# Patient Record
Sex: Female | Born: 1990 | Race: White | Hispanic: No | Marital: Married | Smoking: Never smoker
Health system: Southern US, Community
[De-identification: ages and names within clinical notes are randomized; demographics above are authoritative.]

## PROBLEM LIST (undated history)

## (undated) ENCOUNTER — Emergency Department (HOSPITAL_COMMUNITY): Admission: EM | Payer: BC Managed Care – PPO | Source: Home / Self Care

## (undated) DIAGNOSIS — T8859XA Other complications of anesthesia, initial encounter: Secondary | ICD-10-CM

## (undated) DIAGNOSIS — F410 Panic disorder [episodic paroxysmal anxiety] without agoraphobia: Secondary | ICD-10-CM

## (undated) DIAGNOSIS — M5126 Other intervertebral disc displacement, lumbar region: Secondary | ICD-10-CM

## (undated) DIAGNOSIS — Z8489 Family history of other specified conditions: Secondary | ICD-10-CM

## (undated) DIAGNOSIS — F419 Anxiety disorder, unspecified: Secondary | ICD-10-CM

## (undated) DIAGNOSIS — E559 Vitamin D deficiency, unspecified: Secondary | ICD-10-CM

## (undated) DIAGNOSIS — G932 Benign intracranial hypertension: Secondary | ICD-10-CM

## (undated) DIAGNOSIS — R519 Headache, unspecified: Secondary | ICD-10-CM

## (undated) DIAGNOSIS — K219 Gastro-esophageal reflux disease without esophagitis: Secondary | ICD-10-CM

## (undated) DIAGNOSIS — K824 Cholesterolosis of gallbladder: Secondary | ICD-10-CM

## (undated) HISTORY — DX: Benign intracranial hypertension: G93.2

## (undated) HISTORY — DX: Cholesterolosis of gallbladder: K82.4

## (undated) HISTORY — PX: WISDOM TOOTH EXTRACTION: SHX21

## (undated) HISTORY — PX: NO PAST SURGERIES: SHX2092

---
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
## 2012-12-28 ENCOUNTER — Emergency Department (HOSPITAL_COMMUNITY)
Admission: EM | Admit: 2012-12-28 | Discharge: 2012-12-28 | Disposition: A | Discharge status: Home / Self Care | Payer: Self-pay | Attending: Emergency Medicine | Admitting: Emergency Medicine

## 2012-12-28 ENCOUNTER — Encounter (HOSPITAL_COMMUNITY): Payer: Self-pay | Admitting: Emergency Medicine

## 2012-12-28 ENCOUNTER — Emergency Department (HOSPITAL_COMMUNITY): Payer: Self-pay

## 2012-12-28 DIAGNOSIS — Z8659 Personal history of other mental and behavioral disorders: Secondary | ICD-10-CM | POA: Insufficient documentation

## 2012-12-28 DIAGNOSIS — R55 Syncope and collapse: Secondary | ICD-10-CM | POA: Insufficient documentation

## 2012-12-28 DIAGNOSIS — Z3202 Encounter for pregnancy test, result negative: Secondary | ICD-10-CM | POA: Insufficient documentation

## 2012-12-28 HISTORY — DX: Anxiety disorder, unspecified: F41.9

## 2012-12-28 HISTORY — DX: Panic disorder (episodic paroxysmal anxiety): F41.0

## 2012-12-28 LAB — URINALYSIS, ROUTINE W REFLEX MICROSCOPIC
Bilirubin Urine: NEGATIVE
Ketones, ur: NEGATIVE mg/dL
Protein, ur: NEGATIVE mg/dL
Specific Gravity, Urine: 1.015 (ref 1.005–1.030)
Urobilinogen, UA: 0.2 mg/dL (ref 0.0–1.0)
pH: 7 (ref 5.0–8.0)

## 2012-12-28 LAB — URINE MICROSCOPIC-ADD ON

## 2012-12-28 NOTE — ED Notes (Addendum)
Patient brought in via EMS. Alert and oriented. Airway patent. Patient having blood drawn at [facility] when she started feeling dizzy. Patient reports LOC shortly after blood drawn in which she fell onto concrete floor from sited position on stretcher. Per patient has not eaten since yesterday and has panic attacks. Patient reports some soreness in neck. No C-collar placed on patient by EMS.

## 2012-12-28 NOTE — ED Provider Notes (Signed)
CSN: [id number]     Arrival date & time 12/28/12  1352 History   First MD Initiated Contact with Patient 12/28/12 1511     Chief Complaint  Patient presents with  . Loss of Consciousness    HPI Pt was seen at 1525. Per pt and her family, c/o sudden onset and resolution of one episode of brief syncope that occurred PTA. [facility] and blood draw today. States when they started to draw her blood she "felt dizzy" and "passed out." Endorses hx of same. States she tried to lay down from a sitting position but "missed the bed" and "fell to the floor." States she last ate yesterday. Denies seizure activity, no incont of bowel/bladder, no AMS/confusion, no CP/palpitations, no SOB/cough, no abd pain, no N/V/D, no visual changes, no focal motor weakness, no tingling/numbness in extremities, no ataxia, no slurred speech, no facial droop.    Past Medical History  Diagnosis Date  . Anxiety   . Panic attacks    History reviewed. No pertinent past surgical history.  History  Substance Use Topics  . Smoking status: Never Smoker   . Smokeless tobacco: Never Used  . Alcohol Use: Yes     Comment: occasional    Review of Systems ROS: Statement: All systems negative except as marked or noted in the HPI; Constitutional: Negative for fever and chills. ; ; Eyes: Negative for eye pain, redness and discharge. ; ; ENMT: Negative for ear pain, hoarseness, nasal congestion, sinus pressure and sore throat. ; ; Cardiovascular: Negative for chest pain, palpitations, diaphoresis, dyspnea and peripheral edema. ; ; Respiratory: Negative for cough, wheezing and stridor. ; ; Gastrointestinal: Negative for nausea, vomiting, diarrhea, abdominal pain, blood in stool, hematemesis, jaundice and rectal bleeding. . ; ; Genitourinary: Negative for dysuria, flank pain and hematuria. ; ; Musculoskeletal: Negative for back pain. Negative for swelling and deformity.; ; Skin: Negative for  pruritus, rash, abrasions, blisters, bruising and skin lesion.; ; Neuro: Negative for headache, lightheadedness and neck stiffness. Negative for weakness, altered mental status, extremity weakness, paresthesias, involuntary movement, seizure and +syncope.     Allergies  Review of patient's allergies indicates no known allergies.  Home Medications  No current outpatient prescriptions on file. BP 120/76  Pulse 66  Temp(Src) 98.3 F (36.8 C) (Oral)  Resp 20  Ht 5\' 5"  (1.651 m)  Wt 160 lb (72.576 kg)  BMI 26.63 kg/m2  SpO2 100%  LMP 12/25/2012 Physical Exam 1530: Physical examination:  Nursing notes reviewed; Vital signs and O2 SAT reviewed;  Constitutional: Well developed, Well nourished, Well hydrated, In no acute distress; Head:  Normocephalic, atraumatic; Eyes: EOMI, PERRL, No scleral icterus; ENMT: Mouth and pharynx normal, Mucous membranes moist; Neck: Supple, Full range of motion, No lymphadenopathy; Cardiovascular: Regular rate and rhythm, No murmur, rub, or gallop; Respiratory: Breath sounds clear & equal bilaterally, No rales, rhonchi, wheezes.  Speaking full sentences with ease, Normal respiratory effort/excursion; Chest: Nontender, Movement normal; Abdomen: Soft, Nontender, Nondistended, Normal bowel sounds; Genitourinary: No CVA tenderness; Spine:  No midline CS, TS, LS tenderness.;; Extremities: Pulses normal, No tenderness, No edema, No calf edema or asymmetry.; Neuro: AA&Ox3, Major CN grossly intact.  Speech clear. Climbs on and off stretcher easily by herself. Gait steady. No gross focal motor or sensory deficits in extremities.; Skin: Color normal, Warm, Dry.   ED Course  Procedures   1535:  Pt states she "doesn't want any blood drawn." Pt's mother  at bedside endorses pt has hx of "passing out when she gets her blood drawn." Pt is agreeable to check her CBG.   1800:  Pt not orthostatic. Has tol PO well while in the ED without N/V. States she feels "much better now" and wants  to go home. Dx and testing d/w pt and family.  Questions answered.  Verb understanding, agreeable to d/c home with outpt f/u.    EKG Interpretation   None       MDM  MDM Reviewed: previous chart, nursing note and vitals Interpretation: labs and CT scan     Results for orders placed during the hospital encounter of 12/28/12  URINALYSIS, ROUTINE W REFLEX MICROSCOPIC      Result Value Range   Color, Urine YELLOW  YELLOW   APPearance CLEAR  CLEAR   Specific Gravity, Urine 1.015  1.005 - 1.030   pH 7.0  5.0 - 8.0   Glucose, UA NEGATIVE  NEGATIVE mg/dL   Hgb urine dipstick TRACE (*) NEGATIVE   Bilirubin Urine NEGATIVE  NEGATIVE   Ketones, ur NEGATIVE  NEGATIVE mg/dL   Protein, ur NEGATIVE  NEGATIVE mg/dL   Urobilinogen, UA 0.2  0.0 - 1.0 mg/dL   Nitrite NEGATIVE  NEGATIVE   Leukocytes, UA NEGATIVE  NEGATIVE  PREGNANCY, URINE      Result Value Range   Preg Test, Ur NEGATIVE  NEGATIVE  GLUCOSE, CAPILLARY      Result Value Range   Glucose-Capillary 103 (*) 70 - 99 mg/dL  URINE MICROSCOPIC-ADD ON      Result Value Range   Squamous Epithelial / LPF RARE  RARE   WBC, UA 0-2  <3 WBC/hpf   RBC / HPF 0-2  <3 RBC/hpf   Bacteria, UA RARE  RARE  POCT PREGNANCY, URINE      Result Value Range   Preg Test, Ur NEGATIVE  NEGATIVE   Ct Head Wo Contrast 12/28/2012   CLINICAL DATA:  Syncopal episode well having blood drawn today. Fall. Neck soreness. Posterior headache.  EXAM: CT HEAD WITHOUT CONTRAST  CT CERVICAL SPINE WITHOUT CONTRAST  TECHNIQUE: Multidetector CT imaging of the head and cervical spine was performed following the standard protocol without intravenous contrast. Multiplanar CT image reconstructions of the cervical spine were also generated.  COMPARISON:  None.  FINDINGS: CT HEAD FINDINGS  No acute cortical infarct, hemorrhage, or mass lesion is present. The ventricles are of normal size. No significant extra-axial fluid collection is evident. The paranasal sinuses and mastoid  air cells are clear. The osseous skull is intact.  CT CERVICAL SPINE FINDINGS  The cervical spine is imaged from the skull base through T1-2. The vertebral body heights are maintained. Alignment is anatomic. There straightening of the normal cervical lordosis. The soft tissues are unremarkable. The lung apices are clear.  IMPRESSION: 1. Normal CT of the head. 2. No acute fracture or traumatic subluxation in the cervical spine. 3. Straightening of the normal cervical lordosis is likely positional. This can be seen in the setting of ongoing pain or muscle strain.   Electronically Signed   By: Gennette Pac M.D.   On: 12/28/2012 16:51   Ct Cervical Spine Wo Contrast 12/28/2012   CLINICAL DATA:  Syncopal episode well having blood drawn today. Fall. Neck soreness. Posterior headache.  EXAM: CT HEAD WITHOUT CONTRAST  CT CERVICAL SPINE WITHOUT CONTRAST  TECHNIQUE: Multidetector CT imaging of the head and cervical spine was performed following the standard protocol without intravenous contrast. Multiplanar CT  image reconstructions of the cervical spine were also generated.  COMPARISON:  None.  FINDINGS: CT HEAD FINDINGS  No acute cortical infarct, hemorrhage, or mass lesion is present. The ventricles are of normal size. No significant extra-axial fluid collection is evident. The paranasal sinuses and mastoid air cells are clear. The osseous skull is intact.  CT CERVICAL SPINE FINDINGS  The cervical spine is imaged from the skull base through T1-2. The vertebral body heights are maintained. Alignment is anatomic. There straightening of the normal cervical lordosis. The soft tissues are unremarkable. The lung apices are clear.  IMPRESSION: 1. Normal CT of the head. 2. No acute fracture or traumatic subluxation in the cervical spine. 3. Straightening of the normal cervical lordosis is likely positional. This can be seen in the setting of ongoing pain or muscle strain.   Electronically Signed   By: Gennette Pac M.D.   On:  12/28/2012 16:51      Laray Anger, DO 12/30/12 1658

## 2012-12-29 LAB — HM PAP SMEAR

## 2013-03-11 ENCOUNTER — Inpatient Hospital Stay (HOSPITAL_COMMUNITY)
Admission: AD | Admit: 2013-03-11 | Discharge: 2013-03-11 | Discharge status: Against Medical Advice | Payer: Self-pay | Attending: Obstetrics & Gynecology | Admitting: Obstetrics & Gynecology

## 2013-03-22 ENCOUNTER — Encounter: Payer: Self-pay | Admitting: *Deleted

## 2013-03-24 ENCOUNTER — Encounter: Payer: Self-pay | Admitting: Obstetrics and Gynecology

## 2013-03-24 ENCOUNTER — Encounter (INDEPENDENT_AMBULATORY_CARE_PROVIDER_SITE_OTHER): Payer: Self-pay

## 2013-03-24 ENCOUNTER — Ambulatory Visit (INDEPENDENT_AMBULATORY_CARE_PROVIDER_SITE_OTHER): Payer: Self-pay | Admitting: Obstetrics and Gynecology

## 2013-03-24 VITALS — BP 100/60 | Ht 66.0 in | Wt 163.0 lb

## 2013-03-24 DIAGNOSIS — Z719 Counseling, unspecified: Secondary | ICD-10-CM

## 2013-03-24 DIAGNOSIS — R87619 Unspecified abnormal cytological findings in specimens from cervix uteri: Secondary | ICD-10-CM

## 2013-03-24 DIAGNOSIS — F411 Generalized anxiety disorder: Secondary | ICD-10-CM

## 2013-03-24 NOTE — Patient Instructions (Signed)
Colposcopy  Colposcopy is a procedure to examine your cervix and vagina, or the area around the outside of your vagina, for abnormalities or signs of disease. The procedure is done using a lighted microscope called a colposcope. Tissue samples may be collected during the colposcopy if your health care provider finds any unusual cells. A colposcopy may be done if a woman has:   An abnormal Pap test. A Pap test is a medical test done to evaluate cells that are on the surface of the cervix.   A Pap test result that is suggestive of human papillomavirus (HPV). This virus can cause genital warts and is linked to the development of cervical cancer.   A sore on her cervix and the results of a Pap test were normal.   Genital warts on the cervix or in or around the outside of the vagina.   A mother who took the drug diethylstilbestrol (DES) while pregnant.   Painful intercourse.   Vaginal bleeding, especially after sexual intercourse.  LET YOUR HEALTH CARE PROVIDER KNOW ABOUT:   Any allergies you have.   All medicines you are taking, including vitamins, herbs, eye drops, creams, and over-the-counter medicines.   Previous problems you or members of your family have had with the use of anesthetics.   Any blood disorders you have.   Previous surgeries you have had.   Medical conditions you have.  RISKS AND COMPLICATIONS  Generally, a colposcopy is a safe procedure. However, as with any procedure, complications can occur. Possible complications include:   Bleeding.   Infection.   Missed lesions.  BEFORE THE PROCEDURE    Tell your health care provider if you have your menstrual period. A colposcopy typically is not done during menstruation.   For 24 hours before the colposcopy, do not:   Douche.   Use tampons.   Use medicines, creams, or suppositories in the vagina.   Have sexual intercourse.  PROCEDURE   During the procedure, you will be lying on your back with your feet in foot rests (stirrups). A warm  metal or plastic instrument (speculum) will be placed in your vagina to keep it open and to allow the health care provider to see the cervix. The colposcope will be placed outside the vagina. It will be used to magnify and examine the cervix, vagina, and the area around the outside of the vagina. A small amount of liquid solution will be placed on the area that is to be viewed. This solution will make it easier to see the abnormal cells. Your health care provider will use tools to suck out mucus and cells from the canal of the cervix. Then he or she will record the location of the abnormal areas.  If a biopsy is done during the procedure, a medicine will usually be given to numb the area (local anesthetic). You may feel mild pain or cramping while the biopsy is done. After the procedure, tissue samples collected during the biopsy will be sent to a lab for analysis.  AFTER THE PROCEDURE   You will be given instructions on when to follow up with your health care provider for your test results. It is important to keep your appointment.  Document Released: 03/30/2002 Document Revised: 09/09/2012 Document Reviewed: 08/06/2012  [employer] Patient Information 2014 [employer]

## 2013-03-24 NOTE — Progress Notes (Signed)
This chart was scribed by Jenne Campus, Medical Scribe, for Dr. Mallory Shirk on 03/24/13 at 11:47 AM. This chart was reviewed by Dr. Mallory Shirk and is accurate.    [facility] Visit  Patient name: Lisa Christian MRN [id number]  Date of birth: 08/07/1990  CC & HPI:  Lisa Christian is a 23 y.o. female presenting today for colposcopy discussion. H/o panic attacks and syncope. Here to met me and discuss surgery to help with anxiety. Low grade abnormalities on PAP smear. Had BV and yeast at time of PAP.  ROS:  No complaints  Pertinent History Reviewed:  Medical & Surgical Hx:  Reviewed: Significant for panic attacks and syncope Medications: Reviewed & Updated - see associated section Social History: Reviewed -  reports that she has quit smoking. She has never used smokeless tobacco.  Objective Findings:  Vitals: BP 100/60  Ht $R'5\' 6"'rU$  (1.676 m)  Wt 163 lb (73.936 kg)  BMI 26.32 kg/m2  LMP 03/22/2013  Physical Examination: Not indicated    Assessment & Plan:  A: colposcopy discussed. Pt's questions answered to apparent satisfaction.  P: will schedule colposcopy for a Tuesday

## 2013-04-06 ENCOUNTER — Encounter: Payer: Self-pay | Admitting: Obstetrics and Gynecology

## 2013-04-06 ENCOUNTER — Other Ambulatory Visit: Payer: Self-pay | Admitting: Obstetrics and Gynecology

## 2013-04-06 ENCOUNTER — Ambulatory Visit (INDEPENDENT_AMBULATORY_CARE_PROVIDER_SITE_OTHER): Payer: Self-pay | Admitting: Obstetrics and Gynecology

## 2013-04-06 DIAGNOSIS — Z3202 Encounter for pregnancy test, result negative: Secondary | ICD-10-CM

## 2013-04-06 DIAGNOSIS — N870 Mild cervical dysplasia: Secondary | ICD-10-CM

## 2013-04-06 LAB — POCT URINE PREGNANCY: Preg Test, Ur: NEGATIVE

## 2013-04-06 NOTE — Progress Notes (Signed)
Patient ID: Lisa Christian, female   DOB: [birth year], 23 y.o.   MRN: [id number]  Lisa Christian 23 y.o. G0P0 here for colposcopy for low-grade squamous intraepithelial neoplasia (LGSIL - encompassing HPV,mild dysplasia,CIN I) pap smear on 12/29/12.  Discussed role for HPV in cervical dysplasia, need for surveillance.  Patient given informed consent, signed copy in the chart, time out was performed.  Placed in lithotomy position. Cervix viewed with speculum and colposcope after application of acetic acid.   Colposcopy adequate? 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   ECC specimen not done, cervix everted. All specimens were labelled and sent to pathology.  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  Patient was given post procedure instructions. Will follow up pathology and manage accordingly.  Routine preventative health maintenance measures emphasized.

## 2013-04-16 ENCOUNTER — Telehealth: Payer: Self-pay | Admitting: *Deleted

## 2013-04-16 NOTE — Telephone Encounter (Signed)
Message copied by Lisa Christian, CHCriss AlvineYSTAL G on Fri Apr 16, 2013  8:43 AM ------      Message from: Lisa BurrowFERGUSON, JOHN V      Created: Thu Apr 15, 2013 10:23 PM       Please notify pt that results were as expected, Mild dysplasia, and will need pap with HPV testing in 1 year. ------

## 2013-04-20 NOTE — Telephone Encounter (Signed)
Message copied by Criss AlvinePULLIAM, Cameron Schwinn G on Tue Apr 20, 2013 10:48 AM ------      Message from: Tilda BurrowFERGUSON, JOHN V      Created: Thu Apr 08, 2013 10:02 PM       LSIL on colpo biopsies . Patient will be followed up 1 yr with Pap with cotesting for HPV ------

## 2013-04-20 NOTE — Telephone Encounter (Signed)
Pt informed of mild dysplasia to f/u 1 year with pap and HPV testing per Dr. Emelda FearFerguson. Pt to keep appt for 04/22/2013.

## 2013-04-20 NOTE — Telephone Encounter (Signed)
Message copied by Criss AlvinePULLIAM, Oceana Walthall G on Tue Apr 20, 2013  9:48 AM ------      Message from: Tilda BurrowFERGUSON, JOHN V      Created: Thu Apr 08, 2013 10:02 PM       LSIL on colpo biopsies . Patient will be followed up 1 yr with Pap with cotesting for HPV ------

## 2013-04-22 ENCOUNTER — Ambulatory Visit: Payer: Self-pay | Admitting: Obstetrics and Gynecology

## 2013-06-17 ENCOUNTER — Encounter (HOSPITAL_COMMUNITY): Payer: Self-pay | Admitting: Emergency Medicine

## 2013-06-17 ENCOUNTER — Emergency Department (HOSPITAL_COMMUNITY): Payer: Self-pay

## 2013-06-17 ENCOUNTER — Other Ambulatory Visit: Payer: Self-pay

## 2013-06-17 DIAGNOSIS — R0789 Other chest pain: Secondary | ICD-10-CM | POA: Insufficient documentation

## 2013-06-17 DIAGNOSIS — Z79899 Other long term (current) drug therapy: Secondary | ICD-10-CM | POA: Insufficient documentation

## 2013-06-17 DIAGNOSIS — Z8659 Personal history of other mental and behavioral disorders: Secondary | ICD-10-CM | POA: Insufficient documentation

## 2013-06-17 DIAGNOSIS — R002 Palpitations: Secondary | ICD-10-CM | POA: Insufficient documentation

## 2013-06-17 LAB — CBC
HEMATOCRIT: 38.4 % (ref 36.0–46.0)
HEMOGLOBIN: 13.3 g/dL (ref 12.0–15.0)
MCH: 32.4 pg (ref 26.0–34.0)
MCHC: 34.6 g/dL (ref 30.0–36.0)
MCV: 93.7 fL (ref 78.0–100.0)
Platelets: 219 10*3/uL (ref 150–400)
RBC: 4.1 MIL/uL (ref 3.87–5.11)
RDW: 12.1 % (ref 11.5–15.5)
WBC: 10 10*3/uL (ref 4.0–10.5)

## 2013-06-17 LAB — BASIC METABOLIC PANEL
BUN: 8 mg/dL (ref 6–23)
CALCIUM: 9.4 mg/dL (ref 8.4–10.5)
CHLORIDE: 102 meq/L (ref 96–112)
CO2: 24 mEq/L (ref 19–32)
CREATININE: 0.7 mg/dL (ref 0.50–1.10)
GFR calc Af Amer: >90 mL/min (ref >90)
GFR calc non Af Amer: >90 mL/min (ref >90)
GLUCOSE: 96 mg/dL (ref 70–99)
Potassium: 3.6 mEq/L — ABNORMAL LOW (ref 3.7–5.3)
Sodium: 139 mEq/L (ref 137–147)

## 2013-06-17 LAB — I-STAT TROPONIN, ED: TROPONIN I, POC: 0 ng/mL (ref 0.00–0.08)

## 2013-06-17 NOTE — ED Notes (Signed)
Pt reports chest pain for extended amount of time but it became more severe today. She has chest tightness and palptiations. ekg done at triage, airway intact.

## 2013-06-18 ENCOUNTER — Emergency Department (HOSPITAL_COMMUNITY)
Admission: EM | Admit: 2013-06-18 | Discharge: 2013-06-18 | Disposition: A | Discharge status: Home / Self Care | Payer: Self-pay | Attending: Emergency Medicine | Admitting: Emergency Medicine

## 2013-06-18 DIAGNOSIS — R079 Chest pain, unspecified: Secondary | ICD-10-CM

## 2013-06-18 DIAGNOSIS — R002 Palpitations: Secondary | ICD-10-CM

## 2013-06-18 NOTE — ED Notes (Signed)
Pt A&Ox4, ambulatory at discharge with steady gait, NAD.

## 2013-06-18 NOTE — ED Notes (Signed)
Pt alert, NAD, calm, interactive, resps e/u, skin W&D, speaking in clear complete sentences. Here for CP comes and goes for last 2 months, associated with anxiety and sob. Denies any sx at this time.

## 2013-06-18 NOTE — ED Notes (Signed)
Dr. Nanavati at bedside 

## 2013-06-18 NOTE — ED Provider Notes (Signed)
CSN: [id number]     Arrival date & time 06/17/13  1857 History   First MD Initiated Contact with Patient 06/18/13 0109     Chief Complaint  Patient presents with  . Chest Pain     (Consider location/radiation/quality/duration/timing/severity/associated sxs/prior Treatment) HPI Comments: Pt is a [age] t/o woman who comes in with cc of palpitations and chest pain. Pt has been having these sx off and on for 2 months now. Palpitations are unprovoked, no associated dizziness, lightheadedness or near fainting. Pt not taking any stimulants or drugs. Pt recently has started having chest pain - left shoulder and axillary area. The pain is described as tightness. It has no aggravating or relieving factors. No fam hx of premature CAD or sudden deaths.  Patient is a 23 y.o. female presenting with chest pain. The history is provided by the patient.  Chest Pain Associated symptoms: palpitations   Associated symptoms: no abdominal pain, no headache, no nausea, no shortness of breath and not vomiting     Past Medical History  Diagnosis Date  . Anxiety   . Panic attacks    History reviewed. No pertinent past surgical history. Family History  Problem Relation Age of Onset  . Anxiety disorder Mother   . Heart disease Maternal Grandfather    History  Substance Use Topics  . Smoking status: Never Smoker   . Smokeless tobacco: Never Used  . Alcohol Use: Yes     Comment: occasional   OB History   Grav Para Term Preterm Abortions TAB SAB Ect Mult Living            0     Review of Systems  Constitutional: Negative for activity change.  Respiratory: Positive for chest tightness. Negative for shortness of breath and wheezing.   Cardiovascular: Positive for chest pain and palpitations.  Gastrointestinal: Negative for nausea, vomiting and abdominal pain.  Genitourinary: Negative for dysuria.  Musculoskeletal: Negative for neck pain.  Neurological: Negative for headaches.      Allergies  Review  of patient's allergies indicates no known allergies.  Home Medications   Prior to Admission medications   Medication Sig Start Date End Date Taking? Authorizing Provider  Norgestimate-Ethinyl Estradiol Triphasic (ORTHO TRI-CYCLEN LO) 0.18/0.215/0.25 MG-25 MCG tab Take 1 tablet by mouth daily.   Yes Historical Provider, MD   BP 99/67  Pulse 89  Temp(Src) 97.9 F (36.6 C) (Oral)  Resp 16  SpO2 100%  LMP 06/03/2013 Physical Exam  Nursing note and vitals reviewed. Constitutional: She is oriented to person, place, and time. She appears well-developed and well-nourished.  HENT:  Head: Normocephalic and atraumatic.  Eyes: EOM are normal. Pupils are equal, round, and reactive to light.  Neck: Neck supple.  Cardiovascular: Normal rate, regular rhythm, normal heart sounds and intact distal pulses.   No murmur heard. Pulmonary/Chest: Effort normal. No respiratory distress. She has no wheezes.  Pain reproducible with palpation and with movement of her LUE  Abdominal: Soft. She exhibits no distension. There is no tenderness. There is no rebound and no guarding.  Neurological: She is alert and oriented to person, place, and time.  Skin: Skin is warm and dry.    ED Course  Procedures (including critical care time) Labs Review Labs Reviewed  BASIC METABOLIC PANEL - Abnormal; Notable for the following:    Potassium 3.6 (*)    All other components within normal limits  CBC  I-STAT TROPOININ, ED    Imaging Review Dg Chest 2 View  06/17/2013  CLINICAL DATA:  Chest pain  EXAM: CHEST  2 VIEW  COMPARISON:  None.  FINDINGS: The lungs are clear and negative for focal airspace consolidation, pulmonary edema or suspicious pulmonary nodule. No pleural effusion or pneumothorax. Cardiac and mediastinal contours are within normal limits. No acute fracture or lytic or blastic osseous lesions. The visualized upper abdominal bowel gas pattern is unremarkable.  IMPRESSION: No active cardiopulmonary  disease.   Electronically Signed   By: Malachy Moan M.D.   On: 06/17/2013 21:36     EKG Interpretation   Date/Time:  Thursday Jun 17 2013 19:05:08 EDT Ventricular Rate:  70 PR Interval:  146 QRS Duration: 82 QT Interval:  398 QTC Calculation: 429 R Axis:   75 Text Interpretation:  Normal sinus rhythm with sinus arrhythmia Normal ECG  Confirmed by Terryon Pineiro, MD, Pressley Barsky ([id number]) on 06/18/2013 2:34:20 AM      MDM   Final diagnoses:  Chest pain  Palpitations    Pt comes in with cc of chest pain and palpitations. Chest pain appears musculoskeletal. She has no cardiac risk factors, pain is atypical and although she takes oral birth controls, i dont think she has a PE. Cannot PERC her due to her meds.  Pt advised to take motrin for pain round the clock. Advised to see PCP or [facility] - as she might need to be monitored more for the palpitations. Pt understands. Return precautions have been discussed.  Derwood Kaplan, MD 06/18/13 [phone]

## 2013-06-18 NOTE — Discharge Instructions (Signed)
We saw you in the ER for the chest pain and palpitations.. All of our cardiac workup is normal, including labs, EKG and chest X-RAY are normal. We are not sure what is causing your discomfort, but we feel comfortable sending you home at this time. The workup in the ER is not complete, and you should follow up with your primary care doctor for further evaluation.   Chest Pain (Nonspecific) It is often hard to give a specific diagnosis for the cause of chest pain. There is always a chance that your pain could be related to something serious, such as a heart attack or a blood clot in the lungs. You need to follow up with your caregiver for further evaluation. CAUSES   Heartburn.  Pneumonia or bronchitis.  Anxiety or stress.  Inflammation around your heart (pericarditis) or lung (pleuritis or pleurisy).  A blood clot in the lung.  A collapsed lung (pneumothorax). It can develop suddenly on its own (spontaneous pneumothorax) or from injury (trauma) to the chest.  Shingles infection (herpes zoster virus). The chest wall is composed of bones, muscles, and cartilage. Any of these can be the source of the pain.  The bones can be bruised by injury.  The muscles or cartilage can be strained by coughing or overwork.  The cartilage can be affected by inflammation and become sore (costochondritis). DIAGNOSIS  Lab tests or other studies, such as X-rays, electrocardiography, stress testing, or cardiac imaging, may be needed to find the cause of your pain.  TREATMENT   Treatment depends on what may be causing your chest pain. Treatment may include:  Acid blockers for heartburn.  Anti-inflammatory medicine.  Pain medicine for inflammatory conditions.  Antibiotics if an infection is present.  You may be advised to change lifestyle habits. This includes stopping smoking and avoiding alcohol, caffeine, and chocolate.  You may be advised to keep your head raised (elevated) when sleeping.  This reduces the chance of acid going backward from your stomach into your esophagus.  Most of the time, nonspecific chest pain will improve within 2 to 3 days with rest and mild pain medicine. HOME CARE INSTRUCTIONS   If antibiotics were prescribed, take your antibiotics as directed. Finish them even if you start to feel better.  For the next few days, avoid physical activities that bring on chest pain. Continue physical activities as directed.  Do not smoke.  Avoid drinking alcohol.  Only take over-the-counter or prescription medicine for pain, discomfort, or fever as directed by your caregiver.  Follow your caregiver's suggestions for further testing if your chest pain does not go away.  Keep any follow-up appointments you made. If you do not go to an appointment, you could develop lasting (chronic) problems with pain. If there is any problem keeping an appointment, you must call to reschedule. SEEK MEDICAL CARE IF:   You think you are having problems from the medicine you are taking. Read your medicine instructions carefully.  Your chest pain does not go away, even after treatment.  You develop a rash with blisters on your chest. SEEK IMMEDIATE MEDICAL CARE IF:   You have increased chest pain or pain that spreads to your arm, neck, jaw, back, or abdomen.  You develop shortness of breath, an increasing cough, or you are coughing up blood.  You have severe back or abdominal pain, feel nauseous, or vomit.  You develop severe weakness, fainting, or chills.  You have a fever. THIS IS AN EMERGENCY. Do not wait to  see if the pain will go away. Get medical help at once. Call your local emergency services (911 in [place] Do not drive yourself to the hospital. MAKE SURE YOU:   Understand these instructions.  Will watch your condition.  Will get help right away if you are not doing well or get worse. Document Released: 10/17/2004 Document Revised: 04/01/2011 Document Reviewed:  08/13/2007 [facility] Patient Information 2014 [place], [place]. Palpitations  A palpitation is the feeling that your heartbeat is irregular or is faster than normal. It may feel like your heart is fluttering or skipping a beat. Palpitations are usually not a serious problem. However, in some cases, you may need further medical evaluation. CAUSES  Palpitations can be caused by:  Smoking.  Caffeine or other stimulants, such as diet pills or energy drinks.  Alcohol.  Stress and anxiety.  Strenuous physical activity.  Fatigue.  Certain medicines.  Heart disease, especially if you have a history of arrhythmias. This includes atrial fibrillation, atrial flutter, or supraventricular tachycardia.  An improperly working pacemaker or defibrillator. DIAGNOSIS  To find the cause of your palpitations, your caregiver will take your history and perform a physical exam. Tests may also be done, including:  Electrocardiography (ECG). This test records the heart's electrical activity.  Cardiac monitoring. This allows your caregiver to monitor your heart rate and rhythm in real time.  Holter monitor. This is a portable device that records your heartbeat and can help diagnose heart arrhythmias. It allows your caregiver to track your heart activity for several days, if needed.  Stress tests by exercise or by giving medicine that makes the heart beat faster. TREATMENT  Treatment of palpitations depends on the cause of your symptoms and can vary greatly. Most cases of palpitations do not require any treatment other than time, relaxation, and monitoring your symptoms. Other causes, such as atrial fibrillation, atrial flutter, or supraventricular tachycardia, usually require further treatment. HOME CARE INSTRUCTIONS   Avoid:  Caffeinated coffee, tea, soft drinks, diet pills, and energy drinks.  Chocolate.  Alcohol.  Stop smoking if you smoke.  Reduce your stress and anxiety. Things that can help  you relax include:  A method that measures bodily functions so you can learn to control them (biofeedback).  Yoga.  Meditation.  Physical activity such as swimming, jogging, or walking.  Get plenty of rest and sleep. SEEK MEDICAL CARE IF:   You continue to have a fast or irregular heartbeat beyond 24 hours.  Your palpitations occur more often. SEEK IMMEDIATE MEDICAL CARE IF:  You develop chest pain or shortness of breath.  You have a severe headache.  You feel dizzy, or you faint. MAKE SURE YOU:  Understand these instructions.  Will watch your condition.  Will get help right away if you are not doing well or get worse. Document Released: 01/05/2000 Document Revised: 05/04/2012 Document Reviewed: 03/08/2011 [facility] Patient Information 2014 [place], [place].  RESOURCE GUIDE  Chronic Pain Problems: Contact [facility]  [phone] Patients need to be referred by their primary care doctor.  Insufficient Money for Medicine: Contact [employer]:  call "211."   No Primary Care Doctor: - Call Health Connect  [phone] - can help you locate a primary care doctor that  accepts your insurance, provides certain services, etc. - Physician Referral Service- [phone]  Agencies that provide inexpensive medical care: - [facility]  [phone] - [facility]  [phone] - [facility]  [phone] - Women's Clinic  [phone] -  Planned Parenthood  [phone] [facility]  [phone]  Medicaid-accepting [facility] Providers: - [facility]- [address]  [phone], Mon-Fri 9am-7pm, Sat 9am-1pm - [facility]- [address] [place], [place]  [phone] - [facility]- [address], [address] [place]  [phone] [facility]- [address]  [phone] - Renaye Rakers- [address] [place], [address], [phone]  Only accepts  [place] patients after they have their name  applied to their card  Self Pay (no insurance) in [place]: - Sickle Cell Patients: Dr Willey Blade, [facility]  [address] [place], [phone] - [facility]- [address] [place]  [phone]       [facility] [place] [place]- [address]       -     [facility]- see information above (Speak to [employer] if you do not have insurance)       -  [facility]- [address],  [phone]       -  [facility]- [address], [phone]       -  Dr Julio Sicks-  [address], [place], [phone]       -  [facility] - [address] [phone]       -  [facility]- [address] [phone], also [address], [phone]       -    [facility]- [address] [place], [phone], 1st & 3rd Saturday        every month, 10am-1pm  [facility] [address] [place], [place] [address] [phone]  [facility] [address] [place], [place] [address] [phone]  1) Find a Doctor and Pay Out of Pocket Although you won't have to find out who is covered by your insurance plan, it is a good idea to ask around and get recommendations. You will then need to call the office and see if the doctor you have chosen will accept you as a new patient and what types of options they offer for patients who are self-pay. Some doctors offer discounts or will set up payment plans for their patients who do not have insurance, but you will need to ask so you aren't surprised when you get to your appointment.  2) Contact Your Local Health Department Not all health departments have doctors that can see patients for sick visits, but many do, so it is worth a call to see if yours does. If you don't know where your local health department is, you can check in your  phone book. The CDC also has a tool to help you locate your state's health department, and many state websites also have listings of all of their local health departments.  3) Find a Walk-in Clinic If your illness is not likely to be very severe or complicated, you may want to try a walk in clinic. These are popping up all over the country in pharmacies, drugstores, and shopping centers. They're usually staffed by nurse practitioners or physician assistants that have been trained to treat common illnesses and complaints. They're usually fairly quick and inexpensive. However, if you have serious medical issues or chronic medical problems, these are probably  not your best option  STD Testing - [facility] Department of [facility], STD Clinic, [address] [place], phone [phone] or [phone].  Monday - Friday, call for an appointment. [facility] Department of [employer], STD Clinic, [address], [place], phone [phone] or [phone].  Monday - Friday, call for an appointment.  Abuse/Neglect: [facility] Child Abuse Hotline [phone] [facility] Child Abuse Hotline [phone] (After Hours)  Emergency Shelter:  [facility] [phone]  Maternity Homes: - [facility] [phone] - [facility] [phone]  MRSA Hotline #:   509-460-5341  Dental Assistance If unable to pay or uninsured, contact:  [facility]. to become qualified for the adult dental clinic.  Patients with Medicaid: [facility] [phone] [address], [phone] [address] [phone]  If unable to pay, or uninsured, contact [facility] [phone] in [place], [phone] in [facility]) to become qualified for the adult dental clinic  [facility] [address] [place], [place] [address] [phone] [email or web address]  Other Proofreader Services: - [facility]- [address] [place], [place], [place], [address], [phone], Ext. [phone], 2nd and 4th Thursday of the month at 6:30am.  10 clients each day by appointment, can sometimes see walk-in patients if someone does not show for an appointment. [facility]- [address] [place], [place], [address], [phone] - [facility]- [address] [place], [place], [address], [phone] - [facility]- [phone] [facility]- [phone] [facility] Department- [phone]

## 2013-08-09 DIAGNOSIS — I471 Supraventricular tachycardia, unspecified: Secondary | ICD-10-CM | POA: Insufficient documentation

## 2013-09-04 ENCOUNTER — Emergency Department (HOSPITAL_COMMUNITY)
Admission: EM | Admit: 2013-09-04 | Discharge: 2013-09-04 | Disposition: A | Discharge status: Home / Self Care | Payer: BC Managed Care – PPO | Attending: Emergency Medicine | Admitting: Emergency Medicine

## 2013-09-04 ENCOUNTER — Encounter (HOSPITAL_COMMUNITY): Payer: Self-pay | Admitting: Emergency Medicine

## 2013-09-04 DIAGNOSIS — Z79899 Other long term (current) drug therapy: Secondary | ICD-10-CM | POA: Diagnosis not present

## 2013-09-04 DIAGNOSIS — Z8659 Personal history of other mental and behavioral disorders: Secondary | ICD-10-CM | POA: Diagnosis not present

## 2013-09-04 DIAGNOSIS — G44209 Tension-type headache, unspecified, not intractable: Secondary | ICD-10-CM | POA: Diagnosis not present

## 2013-09-04 DIAGNOSIS — R51 Headache: Secondary | ICD-10-CM | POA: Insufficient documentation

## 2013-09-04 MED ORDER — METOCLOPRAMIDE HCL 5 MG/ML IJ SOLN
10.0000 mg | Freq: Once | INTRAMUSCULAR | Status: DC
  Filled 2013-09-04: qty 2

## 2013-09-04 MED ORDER — SODIUM CHLORIDE 0.9 % IV SOLN: 1000.0000 mL | Freq: Once | INTRAVENOUS | Status: DC

## 2013-09-04 MED ORDER — ACETAMINOPHEN 325 MG PO TABS: 650.0000 mg | ORAL_TABLET | Freq: Once | ORAL | Status: DC

## 2013-09-04 MED ORDER — SODIUM CHLORIDE 0.9 % IV SOLN: 1000.0000 mL | INTRAVENOUS | Status: DC

## 2013-09-04 MED ORDER — DIPHENHYDRAMINE HCL 50 MG/ML IJ SOLN
25.0000 mg | Freq: Once | INTRAMUSCULAR | Status: DC
  Filled 2013-09-04: qty 1

## 2013-09-04 NOTE — ED Notes (Signed)
Pt states her headache started after she and her boyfriend were playing around and he took her head and shook it in a playful way.  Pt concerned about the pain starting after this occurred.

## 2013-09-04 NOTE — ED Notes (Signed)
I walked into pts room with IV and medication. Pt began to question me about IV. Pt stated she had never had an IV. Pt states she would rather go home and try something OTC and if it doesn't help, she will come back with her mother. EDP aware.

## 2013-09-04 NOTE — ED Provider Notes (Signed)
CSN: [id number]     Arrival date & time 09/04/13  0150 History   First MD Initiated Contact with Patient 09/04/13 [phone]     Chief Complaint  Patient presents with  . Headache     (Consider location/radiation/quality/duration/timing/severity/associated sxs/prior Treatment) Patient is a 23 y.o. female presenting with headaches. The history is provided by the patient.  Headache She states that she developed an occipital headache after she had been wrestling with her boyfriend and her head with shaking from side to side. This occurred about 24 hours ago. She fell asleep later and woke up with no headache the headache started to recur as the day wore on. She is not complaining of constant pain in her occipital area. Pain is gone she rates it 5/10. It is worse with loud noise but there is no photophobia. There is mild nausea but no vomiting. There is no dizziness or incoordination. There is no weakness or numbness. She has not taken any medication.   Past Medical History  Diagnosis Date  . Anxiety   . Panic attacks    History reviewed. No pertinent past surgical history. Family History  Problem Relation Age of Onset  . Anxiety disorder Mother   . Heart disease Maternal Grandfather    History  Substance Use Topics  . Smoking status: Never Smoker   . Smokeless tobacco: Never Used  . Alcohol Use: Yes     Comment: occasional   OB History   Grav Para Term Preterm Abortions TAB SAB Ect Mult Living            0     Review of Systems  Neurological: Positive for headaches.  All other systems reviewed and are negative.     Allergies  Review of patient's allergies indicates no known allergies.  Home Medications   Prior to Admission medications   Medication Sig Start Date End Date Taking? Authorizing Provider  Norgestimate-Ethinyl Estradiol Triphasic (ORTHO TRI-CYCLEN LO) 0.18/0.215/0.25 MG-25 MCG tab Take 1 tablet by mouth daily.   Yes Historical Provider, MD  omeprazole (PRILOSEC)  40 MG capsule Take 40 mg by mouth daily.   Yes Historical Provider, MD   BP 126/75  Pulse 78  Temp(Src) 98.2 F (36.8 C) (Oral)  Resp 16  Ht 5\' 6"  (1.676 m)  Wt 165 lb (74.844 kg)  BMI 26.64 kg/m2  SpO2 98%  LMP 09/01/2013 Physical Exam  Nursing note and vitals reviewed.  23 year old female, resting comfortably and in no acute distress. Vital signs are normal. Oxygen saturation is 98%, which is normal. Head is normocephalic and atraumatic. PERRLA, EOMI. Oropharynx is clear. Fundi show no hemorrhage, exudate, or papilledema. There is tenderness to palpation over the temporalis muscles bilaterally and over the insertion of the paracervical muscles bilaterally. Neck is nontender and supple without adenopathy or JVD. Back is nontender and there is no CVA tenderness. Lungs are clear without rales, wheezes, or rhonchi. Chest is nontender. Heart has regular rate and rhythm without murmur. Abdomen is soft, flat, nontender without masses or hepatosplenomegaly and peristalsis is normoactive. Extremities have no cyanosis or edema, full range of motion is present. Skin is warm and dry without rash. Neurologic: Mental status is normal, cranial nerves are intact, there are no motor or sensory deficits.  ED Course  Procedures (including critical care time)  MDM   Final diagnoses:  Muscle contraction headache    Headache which seems typical for muscle contraction headache. She will be given a headache cocktail of IV  fluids, metoclopramide, and diphenhydramine.  She refused above noted treatment. Her description of the headache and her physical exam are completely benign. She is discharged with instructions to use over-the-counter analgesics as needed.  Dione Boozeavid Alie Moudy, MD 09/04/13 [phone]

## 2013-09-04 NOTE — Discharge Instructions (Signed)

## 2013-10-26 ENCOUNTER — Encounter: Payer: Self-pay | Admitting: Gastroenterology

## 2013-11-24 ENCOUNTER — Encounter (INDEPENDENT_AMBULATORY_CARE_PROVIDER_SITE_OTHER): Payer: Self-pay

## 2013-11-24 ENCOUNTER — Encounter: Payer: Self-pay | Admitting: Gastroenterology

## 2013-11-24 ENCOUNTER — Ambulatory Visit (INDEPENDENT_AMBULATORY_CARE_PROVIDER_SITE_OTHER): Payer: BC Managed Care – PPO | Admitting: Gastroenterology

## 2013-11-24 VITALS — BP 116/65 | HR 67 | Temp 98.8°F | Ht 66.0 in | Wt 162.0 lb

## 2013-11-24 DIAGNOSIS — K219 Gastro-esophageal reflux disease without esophagitis: Secondary | ICD-10-CM | POA: Insufficient documentation

## 2013-11-24 DIAGNOSIS — R1110 Vomiting, unspecified: Secondary | ICD-10-CM

## 2013-11-24 DIAGNOSIS — R112 Nausea with vomiting, unspecified: Secondary | ICD-10-CM

## 2013-11-24 DIAGNOSIS — R09A2 Foreign body sensation, throat: Secondary | ICD-10-CM

## 2013-11-24 DIAGNOSIS — IMO0001 Reserved for inherently not codable concepts without codable children: Secondary | ICD-10-CM | POA: Insufficient documentation

## 2013-11-24 DIAGNOSIS — F458 Other somatoform disorders: Secondary | ICD-10-CM

## 2013-11-24 HISTORY — DX: Foreign body sensation, throat: R09.A2

## 2013-11-24 HISTORY — DX: Reserved for inherently not codable concepts without codable children: IMO0001

## 2013-11-24 MED ORDER — PANTOPRAZOLE SODIUM 40 MG PO TBEC: 40.0000 mg | DELAYED_RELEASE_TABLET | Freq: Every day | ORAL | Status: DC

## 2013-11-24 NOTE — Patient Instructions (Signed)
1. Stop lansoprazole. 2. Start pantoprazole one daily before breakfast.  3. Xray of your esophagus and stomach as planned.

## 2013-11-24 NOTE — Progress Notes (Signed)
Primary Care Physician:  Lenise HeraldMANN, BENJAMIN, PA-C  Primary Gastroenterologist:  Jonette EvaSandi Fields, MD   Chief Complaint  Patient presents with  . Referral    HPI:  Lisa Christian is a 23 y.o. female here for further evaluation of heartburn at the request of Lenise HeraldBenjamin Mann, PA-C. Patient admittedly has history of anxiety and panic attacks. Previously had cardiac workup for palpitations. No significant findings per patient. She has eliminated almost all of her caffeine intake. Complains of burning/discomfort in the upper esophagus. Belching seems to help. Over the past couple of days having more heartburn sensations. Feels like something stuck in her throat after she eats. Denies vomiting. Significant symptoms with eating salads or roughage. Develops regurgitation associated with that. Salads comes back up. Switch from omeprazole to Prevacid the really hasn't noted any improvement. Recently had H pylori serologies which were negative per patient. Took ibuprfoen 400 mg twice a day for 2 months but this was a couple months ago.      Current Outpatient Prescriptions  Medication Sig Dispense Refill  . Norgestimate-Ethinyl Estradiol Triphasic (ORTHO TRI-CYCLEN LO) 0.18/0.215/0.25 MG-25 MCG tab Take 1 tablet by mouth daily.    . Lansoprazole 30mg  daily   11   No current facility-administered medications for this visit.    Allergies as of 11/24/2013  . (No Known Allergies)    Past Medical History  Diagnosis Date  . Anxiety   . Panic attacks     Past Surgical History  Procedure Laterality Date  . No past surgeries      Family History  Problem Relation Age of Onset  . Anxiety disorder Mother   . Heart disease Maternal Grandfather   . Colon cancer Other     History   Social History  . Marital Status: Single    Spouse Name: N/A    Number of Children: N/A  . Years of Education: N/A   Occupational History  . cosmetology student    Social History Main Topics  . Smoking status: Never  Smoker   . Smokeless tobacco: Never Used  . Alcohol Use: Yes     Comment: occasional  . Drug Use: No  . Sexual Activity: Yes    Birth Control/ Protection: Condom, Pill   Other Topics Concern  . Not on file   Social History Narrative      ROS:  General: Negative for anorexia, weight loss, fever, chills, fatigue, weakness. Eyes: Negative for vision changes.  ENT: Negative for hoarseness, difficulty swallowing , nasal congestion. CV: Negative for chest pain, angina, palpitations, dyspnea on exertion, peripheral edema.  Respiratory: Negative for dyspnea at rest, dyspnea on exertion, cough, sputum, wheezing.  GI: See history of present illness. GU:  Negative for dysuria, hematuria, urinary incontinence, urinary frequency, nocturnal urination.  MS: Negative for joint pain, low back pain.  Derm: Negative for rash or itching.  Neuro: Negative for weakness, abnormal sensation, seizure, frequent headaches, memory loss, confusion.  Psych: Negative for anxiety, depression, suicidal ideation, hallucinations.  Endo: Negative for unusual weight change.  Heme: Negative for bruising or bleeding. Allergy: Negative for rash or hives.    Physical Examination:  BP 116/65 mmHg  Pulse 67  Temp(Src) 98.8 F (37.1 C) (Oral)  Ht 5\' 6"  (1.676 m)  Wt 162 lb (73.483 kg)  BMI 26.16 kg/m2   General: Well-nourished, well-developed in no acute distress.  Head: Normocephalic, atraumatic.   Eyes: Conjunctiva pink, no icterus. Mouth: Oropharyngeal mucosa moist and pink , no lesions erythema or exudate.  Neck: Supple without thyromegaly, masses, or lymphadenopathy.  Lungs: Clear to auscultation bilaterally.  Heart: Regular rate and rhythm, no murmurs rubs or gallops.  Abdomen: Bowel sounds are normal, nontender, nondistended, no hepatosplenomegaly or masses, no abdominal bruits or    hernia , no rebound or guarding.   Rectal: not performed Extremities: No lower extremity edema. No clubbing or  deformities.  Neuro: Alert and oriented x 4 , grossly normal neurologically.  Skin: Warm and dry, no rash or jaundice.   Psych: Alert and cooperative, normal mood and affect.  Labs: Lab Results  Component Value Date   WBC 10.0 06/17/2013   HGB 13.3 06/17/2013   HCT 38.4 06/17/2013   MCV 93.7 06/17/2013   PLT 219 06/17/2013

## 2013-11-25 ENCOUNTER — Telehealth: Payer: Self-pay

## 2013-11-25 NOTE — Telephone Encounter (Signed)
I called [employer] [phone] and spoke to Kilbourneolanda T and a PA is not required for BPE/ UGI. Harriett Sineancy is aware in Radiology.

## 2013-11-26 ENCOUNTER — Ambulatory Visit (HOSPITAL_COMMUNITY)
Admission: RE | Admit: 2013-11-26 | Discharge: 2013-11-26 | Disposition: A | Discharge status: Home / Self Care | Payer: BC Managed Care – PPO | Source: Ambulatory Visit | Attending: Gastroenterology | Admitting: Gastroenterology

## 2013-11-26 DIAGNOSIS — R1110 Vomiting, unspecified: Secondary | ICD-10-CM | POA: Insufficient documentation

## 2013-11-26 DIAGNOSIS — K219 Gastro-esophageal reflux disease without esophagitis: Secondary | ICD-10-CM

## 2013-11-26 DIAGNOSIS — IMO0001 Reserved for inherently not codable concepts without codable children: Secondary | ICD-10-CM

## 2013-11-26 DIAGNOSIS — F458 Other somatoform disorders: Secondary | ICD-10-CM

## 2013-11-26 DIAGNOSIS — R079 Chest pain, unspecified: Secondary | ICD-10-CM | POA: Diagnosis not present

## 2013-11-26 NOTE — Assessment & Plan Note (Signed)
23 year old female with history of substernal chest discomfort especially in the upper esophageal area, globus associated with meals, regurgitation. Reports recent negative H. Pylori serologies. Symptoms unresponsive to omeprazole and lansoprazole. Discussed diagnostic upper endoscopy with patient. She has significant anxiety related invasive procedures and sedation. She prefers upper GI series/esophagram. Switch her to pantoprazole 40 mg daily before breakfast.

## 2013-11-29 NOTE — Progress Notes (Signed)
cc'ed to pcp °

## 2013-11-30 NOTE — Progress Notes (Signed)
Quick Note:  Pt is aware of results. ______ 

## 2013-11-30 NOTE — Progress Notes (Signed)
Quick Note:  Please let patient know her UGI series was normal. Esophagus looked normal as well as stomach. No evidence of ulcers, esophagitis, strictures.  Suspect symptoms related to non-erosive reflux disease. Continue pantoprazole 40mg  daily before breakfast. If no better in 3-4 weeks, then we can consider increasing to BID before meals.  Schedule a routine follow up in 2 months. ______

## 2013-11-30 NOTE — Progress Notes (Signed)
Quick Note:  LMOM to call. ______ 

## 2013-12-14 ENCOUNTER — Telehealth: Payer: Self-pay | Admitting: Gastroenterology

## 2013-12-14 NOTE — Telephone Encounter (Signed)
Please let patient know that I will discuss with Dr. Darrick PennaFields next week when she returns. I offered her an EGD at time of OV but due to her anxiety related to sedation she opted for UGI/BPE which was entirely normal. EGD at this point would be low yield.

## 2013-12-14 NOTE — Telephone Encounter (Signed)
Please advise 

## 2013-12-14 NOTE — Telephone Encounter (Signed)
Pt called today to say that she was seen earlier this month and the medicine we put her on isn't helping. She had XR/CT done recently and was told her results came back normal, but patient said that provider here suggested for her to schedule an EGD if she didn't feel any better.  Please advise and call patient back at [phone]

## 2013-12-15 NOTE — Telephone Encounter (Signed)
LMOM that we are waiting to her from [facility]

## 2013-12-20 ENCOUNTER — Telehealth: Payer: Self-pay

## 2013-12-20 NOTE — Telephone Encounter (Signed)
Patient returned call. Please call back [phone]

## 2013-12-20 NOTE — Telephone Encounter (Signed)
Pt is calling today and is wanting to have the EGD done soon because she is getting worst. Please advise

## 2013-12-21 NOTE — Telephone Encounter (Signed)
REVIEWED. SCHEDULE EGD W/ MAC TO EVALUATE FOR EOSINOPHILIC OR  H PYLORI .

## 2013-12-22 ENCOUNTER — Other Ambulatory Visit: Payer: Self-pay

## 2013-12-22 NOTE — Telephone Encounter (Signed)
Please schedule for EGD with MAC per [facility] (h/o anxiety/panic attacks is reason for MAC) to evaluate for eosinophilic esophagitis/substernal chest pain, globus.

## 2013-12-22 NOTE — Telephone Encounter (Signed)
Tried to call with no answer  

## 2013-12-22 NOTE — Telephone Encounter (Signed)
Pt is set up for EGD on 01/11/14. She is aware and instructions are in the mail

## 2014-01-05 NOTE — Patient Instructions (Signed)
Lisa Christian  01/05/2014   Your procedure is scheduled on:   01/11/2014  [facility] to [facility] at  900  AM.  Call this number if you have problems the morning of surgery: [phone]   Remember:   Do not eat food or drink liquids after midnight.   Take these medicines the morning of surgery with A SIP OF WATER:  protonix   Do not wear jewelry, make-up or nail polish.  Do not wear lotions, powders, or perfumes.   Do not shave 48 hours prior to surgery. Men may shave face and neck.  Do not bring valuables to the hospital.  [facility] any belongings or valuables.               Contacts, dentures or bridgework may not be worn into surgery.  Leave suitcase in the car. After surgery it may be brought to your room.  For patients admitted to the hospital, discharge time is determined by your treatment team.               Patients discharged the day of surgery will not be allowed to drive  home.  Name and phone number of your driver: family  Special Instructions: N/A   Please read over the following fact sheets that you were given: Pain Booklet, Coughing and Deep Breathing, Surgical Site Infection Prevention and Anesthesia Post-op Instructions Esophagogastroduodenoscopy Esophagogastroduodenoscopy (EGD) is a procedure to examine the lining of the esophagus, stomach, and first part of the small intestine (duodenum). A long, flexible, lighted tube with a camera attached (endoscope) is inserted down the throat to view these organs. This procedure is done to detect problems or abnormalities, such as inflammation, bleeding, ulcers, or growths, in order to treat them. The procedure lasts about 5-20 minutes. It is usually an outpatient procedure, but it may need to be performed in emergency cases in the hospital. LET YOUR CAREGIVER KNOW ABOUT:   Allergies to food or medicine.  All medicines you are taking, including vitamins, herbs, eyedrops, and over-the-counter  medicines and creams.  Use of steroids (by mouth or creams).  Previous problems you or members of your family have had with the use of anesthetics.  Any blood disorders you have.  Previous surgeries you have had.  Other health problems you have.  Possibility of pregnancy, if this applies. RISKS AND COMPLICATIONS  Generally, EGD is a safe procedure. However, as with any procedure, complications can occur. Possible complications include:  Infection.  Bleeding.  Tearing (perforation) of the esophagus, stomach, or duodenum.  Difficulty breathing or not being able to breath.  Excessive sweating.  Spasms of the larynx.  Slowed heartbeat.  Low blood pressure. BEFORE THE PROCEDURE  Do not eat or drink anything for 6-8 hours before the procedure or as directed by your caregiver.  Ask your caregiver about changing or stopping your regular medicines.  If you wear dentures, be prepared to remove them before the procedure.  Arrange for someone to drive you home after the procedure. PROCEDURE   A vein will be accessed to give medicines and fluids. A medicine to relax you (sedative) and a pain reliever will be given through that access into the vein.  A numbing medicine (local anesthetic) may be sprayed on your throat for comfort and to stop you from gagging or coughing.  A mouth guard may be placed in your mouth to protect your teeth and to keep you from biting  on the endoscope.  You will be asked to lie on your left side.  The endoscope is inserted down your throat and into the esophagus, stomach, and duodenum.  Air is put through the endoscope to allow your caregiver to view the lining of your esophagus clearly.  The esophagus, stomach, and duodenum is then examined. During the exam, your caregiver may:  Remove tissue to be examined under a microscope (biopsy) for inflammation, infection, or other medical problems.  Remove growths.  Remove objects (foreign bodies) that  are stuck.  Treat any bleeding with medicines or other devices that stop tissues from bleeding (hot cautery, clipping devices).  Widen (dilate) or stretch narrowed areas of the esophagus and stomach.  The endoscope will then be withdrawn. AFTER THE PROCEDURE  You will be taken to a recovery area to be monitored. You will be able to go home once you are stable and alert.  Do not eat or drink anything until the local anesthetic and numbing medicines have worn off. You may choke.  It is normal to feel bloated, have pain with swallowing, or have a sore throat for a short time. This will wear off.  Your caregiver should be able to discuss his or her findings with you. It will take longer to discuss the test results if any biopsies were taken. Document Released: 05/10/2004 Document Revised: 05/24/2013 Document Reviewed: 12/11/2011 [facility] Patient Information 2015 [employer] [place]. This information is not intended to replace advice given to you by your health care provider. Make sure you discuss any questions you have with your health care provider. PATIENT INSTRUCTIONS POST-ANESTHESIA  IMMEDIATELY FOLLOWING SURGERY:  Do not drive or operate machinery for the first twenty four hours after surgery.  Do not make any important decisions for twenty four hours after surgery or while taking narcotic pain medications or sedatives.  If you develop intractable nausea and vomiting or a severe headache please notify your doctor immediately.  FOLLOW-UP:  Please make an appointment with your surgeon as instructed. You do not need to follow up with anesthesia unless specifically instructed to do so.  WOUND CARE INSTRUCTIONS (if applicable):  Keep a dry clean dressing on the anesthesia/puncture wound site if there is drainage.  Once the wound has quit draining you may leave it open to air.  Generally you should leave the bandage intact for twenty four hours unless there is drainage.  If the epidural site drains  for more than 36-48 hours please call the anesthesia department.  QUESTIONS?:  Please feel free to call your physician or the hospital operator if you have any questions, and they will be happy to assist you.

## 2014-01-06 ENCOUNTER — Encounter (HOSPITAL_COMMUNITY): Payer: Self-pay

## 2014-01-06 ENCOUNTER — Encounter (HOSPITAL_COMMUNITY)
Admission: RE | Admit: 2014-01-06 | Discharge: 2014-01-06 | Disposition: A | Discharge status: Home / Self Care | Payer: BC Managed Care – PPO | Source: Ambulatory Visit | Attending: Gastroenterology | Admitting: Gastroenterology

## 2014-01-06 DIAGNOSIS — Z01812 Encounter for preprocedural laboratory examination: Secondary | ICD-10-CM | POA: Insufficient documentation

## 2014-01-06 HISTORY — DX: Gastro-esophageal reflux disease without esophagitis: K21.9

## 2014-01-06 LAB — BASIC METABOLIC PANEL
ANION GAP: 12 (ref 5–15)
BUN: 5 mg/dL — ABNORMAL LOW (ref 6–23)
CO2: 25 meq/L (ref 19–32)
Calcium: 9.3 mg/dL (ref 8.4–10.5)
Chloride: 104 mEq/L (ref 96–112)
Creatinine, Ser: 0.74 mg/dL (ref 0.50–1.10)
GFR calc non Af Amer: >90 mL/min (ref >90)
Glucose, Bld: 99 mg/dL (ref 70–99)
POTASSIUM: 3.9 meq/L (ref 3.7–5.3)
Sodium: 141 mEq/L (ref 137–147)

## 2014-01-06 LAB — HEMOGLOBIN AND HEMATOCRIT, BLOOD
HEMATOCRIT: 36.2 % (ref 36.0–46.0)
Hemoglobin: 12.3 g/dL (ref 12.0–15.0)

## 2014-01-06 LAB — HCG, SERUM, QUALITATIVE: PREG SERUM: NEGATIVE

## 2014-01-11 ENCOUNTER — Encounter (HOSPITAL_COMMUNITY): Payer: Self-pay | Admitting: Anesthesiology

## 2014-01-11 ENCOUNTER — Encounter (HOSPITAL_COMMUNITY)
Admission: RE | Disposition: A | Discharge status: Home / Self Care | Payer: Self-pay | Source: Ambulatory Visit | Attending: Gastroenterology

## 2014-01-11 ENCOUNTER — Ambulatory Visit (HOSPITAL_COMMUNITY): Payer: BC Managed Care – PPO | Admitting: Anesthesiology

## 2014-01-11 ENCOUNTER — Ambulatory Visit (HOSPITAL_COMMUNITY)
Admission: RE | Admit: 2014-01-11 | Discharge: 2014-01-11 | Disposition: A | Discharge status: Home / Self Care | Payer: Self-pay | Source: Ambulatory Visit | Attending: Gastroenterology | Admitting: Gastroenterology

## 2014-01-11 ENCOUNTER — Ambulatory Visit (HOSPITAL_COMMUNITY): Payer: Self-pay | Admitting: Anesthesiology

## 2014-01-11 DIAGNOSIS — K2960 Other gastritis without bleeding: Secondary | ICD-10-CM | POA: Insufficient documentation

## 2014-01-11 DIAGNOSIS — K219 Gastro-esophageal reflux disease without esophagitis: Secondary | ICD-10-CM | POA: Insufficient documentation

## 2014-01-11 DIAGNOSIS — K30 Functional dyspepsia: Secondary | ICD-10-CM

## 2014-01-11 DIAGNOSIS — F419 Anxiety disorder, unspecified: Secondary | ICD-10-CM | POA: Insufficient documentation

## 2014-01-11 DIAGNOSIS — R1013 Epigastric pain: Secondary | ICD-10-CM | POA: Insufficient documentation

## 2014-01-11 DIAGNOSIS — K6389 Other specified diseases of intestine: Secondary | ICD-10-CM | POA: Insufficient documentation

## 2014-01-11 HISTORY — PX: BIOPSY: SHX5522

## 2014-01-11 HISTORY — PX: ESOPHAGOGASTRODUODENOSCOPY (EGD) WITH PROPOFOL: SHX5813

## 2014-01-11 SURGERY — ESOPHAGOGASTRODUODENOSCOPY (EGD) WITH PROPOFOL
Anesthesia: Monitor Anesthesia Care | Site: Esophagus

## 2014-01-11 MED ORDER — ONDANSETRON HCL 4 MG/2ML IJ SOLN: 4.0000 mg | Freq: Once | INTRAMUSCULAR | Status: DC

## 2014-01-11 MED ORDER — FENTANYL CITRATE 0.05 MG/ML IJ SOLN
25.0000 ug | INTRAMUSCULAR | Status: AC
  Administered 2014-01-11 (×2): 25 ug via INTRAVENOUS

## 2014-01-11 MED ORDER — FENTANYL CITRATE 0.05 MG/ML IJ SOLN
25.0000 ug | INTRAMUSCULAR | Status: DC | PRN
  Filled 2014-01-11: qty 2

## 2014-01-11 MED ORDER — PROPOFOL INFUSION 10 MG/ML OPTIME
INTRAVENOUS | Status: DC | PRN
  Administered 2014-01-11: 100 ug/kg/min via INTRAVENOUS

## 2014-01-11 MED ORDER — MIDAZOLAM HCL 2 MG/2ML IJ SOLN
INTRAMUSCULAR | Status: AC
  Filled 2014-01-11: qty 2

## 2014-01-11 MED ORDER — FENTANYL CITRATE 0.05 MG/ML IJ SOLN
INTRAMUSCULAR | Status: AC
  Filled 2014-01-11: qty 2

## 2014-01-11 MED ORDER — LACTATED RINGERS IV SOLN
INTRAVENOUS | Status: DC | PRN
  Administered 2014-01-11: 10:00:00 via INTRAVENOUS

## 2014-01-11 MED ORDER — LIDOCAINE VISCOUS 2 % MT SOLN
3.0000 mL | Freq: Once | OROMUCOSAL | Status: DC
  Filled 2014-01-11: qty 5

## 2014-01-11 MED ORDER — PROPOFOL 10 MG/ML IV BOLUS
INTRAVENOUS | Status: DC | PRN
  Administered 2014-01-11 (×2): 20 mg via INTRAVENOUS

## 2014-01-11 MED ORDER — LACTATED RINGERS IV SOLN
INTRAVENOUS | Status: DC
  Administered 2014-01-11: 1000 mL via INTRAVENOUS

## 2014-01-11 MED ORDER — LIDOCAINE VISCOUS 2 % MT SOLN
OROMUCOSAL | Status: AC
Start: 2014-01-11 — End: 2014-01-11
  Administered 2014-01-11: 3 mL
  Filled 2014-01-11: qty 15

## 2014-01-11 MED ORDER — LIDOCAINE HCL (PF) 1 % IJ SOLN
INTRAMUSCULAR | Status: AC
  Filled 2014-01-11: qty 5

## 2014-01-11 MED ORDER — SIMETHICONE 40 MG/0.6ML PO SUSP
ORAL | Status: DC | PRN
  Administered 2014-01-11: 13:00:00

## 2014-01-11 MED ORDER — ONDANSETRON HCL 4 MG/2ML IJ SOLN
INTRAMUSCULAR | Status: AC
  Filled 2014-01-11: qty 2

## 2014-01-11 MED ORDER — ONDANSETRON HCL 4 MG/2ML IJ SOLN
4.0000 mg | Freq: Once | INTRAMUSCULAR | Status: AC | PRN
  Administered 2014-01-11: 4 mg via INTRAVENOUS

## 2014-01-11 MED ORDER — MIDAZOLAM HCL 5 MG/5ML IJ SOLN
INTRAMUSCULAR | Status: DC | PRN
  Administered 2014-01-11: 1 mg via INTRAVENOUS
  Administered 2014-01-11: 2 mg via INTRAVENOUS
  Administered 2014-01-11: 1 mg via INTRAVENOUS

## 2014-01-11 MED ORDER — MIDAZOLAM HCL 2 MG/2ML IJ SOLN
1.0000 mg | INTRAMUSCULAR | Status: AC | PRN
  Administered 2014-01-11 (×3): 2 mg via INTRAVENOUS
  Filled 2014-01-11: qty 2

## 2014-01-11 SURGICAL SUPPLY — 8 items
BLOCK BITE 60FR ADLT L/F BLUE (MISCELLANEOUS) ×2 IMPLANT
FLOOR PAD 36X40 (MISCELLANEOUS) ×4
FORCEPS BIOP RAD 4 LRG CAP 4 (CUTTING FORCEPS) ×2 IMPLANT
FORMALIN 10 PREFIL 20ML (MISCELLANEOUS) ×4 IMPLANT
KIT CLEAN ENDO COMPLIANCE (KITS) ×2 IMPLANT
MANIFOLD NEPTUNE II (INSTRUMENTS) ×4 IMPLANT
PAD FLOOR 36X40 (MISCELLANEOUS) IMPLANT
WATER STERILE IRR 1000ML POUR (IV SOLUTION) ×2 IMPLANT

## 2014-01-11 NOTE — H&P (Signed)
  Primary Care Physician:  Lenise HeraldMANN, BENJAMIN, PA-C Primary Gastroenterologist:  Dr. Darrick PennaFields  Pre-Procedure History & Physical: HPI:  Lisa Christian is a 23 y.o. female here for DYSPEPSIA.  Past Medical History  Diagnosis Date  . Anxiety   . Panic attacks   . GERD (gastroesophageal reflux disease)    Past Surgical History  Procedure Laterality Date  . No past surgeries      Prior to Admission medications   Medication Sig Start Date End Date Taking? Authorizing Provider  Norgestimate-Ethinyl Estradiol Triphasic (ORTHO TRI-CYCLEN LO) 0.18/0.215/0.25 MG-25 MCG tab Take 1 tablet by mouth daily.   Yes Historical Provider, MD  omeprazole (PRILOSEC) 40 MG capsule Take 40 mg by mouth daily.   Yes Historical Provider, MD  pantoprazole (PROTONIX) 40 MG tablet Take 1 tablet (40 mg total) by mouth daily before breakfast. 11/24/13  Yes Tiffany KocherLeslie S Lewis, PA-C    Allergies as of 12/22/2013  . (No Known Allergies)    Family History  Problem Relation Age of Onset  . Anxiety disorder Mother   . Heart disease Maternal Grandfather   . Colon cancer Other     History   Social History  . Marital Status: Single    Spouse Name: N/A    Number of Children: N/A  . Years of Education: N/A   Occupational History  . cosmetology student    Social History Main Topics  . Smoking status: Never Smoker   . Smokeless tobacco: Never Used  . Alcohol Use: Yes     Comment: occasional  . Drug Use: No  . Sexual Activity: Yes    Birth Control/ Protection: Condom, Pill   Other Topics Concern  . Not on file   Social History Narrative    Review of Systems: See HPI, otherwise negative ROS   Physical Exam: Pulse 73  Temp(Src) 98.6 F (37 C) (Oral)  Resp 20  SpO2 99% General:   Alert,  pleasant and cooperative in NAD Head:  Normocephalic and atraumatic. Neck:  Supple; Lungs:  Clear throughout to auscultation.    Heart:  Regular rate and rhythm. Abdomen:  Soft, nontender and nondistended. Normal  bowel sounds, without guarding, and without rebound.   Neurologic:  Alert and  oriented x4;  grossly normal neurologically.  Impression/Plan:    DYSPEPSIA  PLAN:  EGD TODAY

## 2014-01-11 NOTE — Op Note (Signed)
[facility] [address] [place], [id number]   ENDOSCOPY PROCEDURE REPORT  PATIENT: Lisa LassoBatts, Nataline M  MR#: [id number] BIRTHDATE: [birth year] , 23  yrs. old GENDER: female  ENDOSCOPIST: Jonette EvaSandi Sueko Dimichele, MD REFERRED BY:  PROCEDURE DATE: 01/11/2014 PROCEDURE:   EGD w/ biopsy  INDICATIONS:dyspepsia. MEDICATIONS: Monitored anesthesia care TOPICAL ANESTHETIC:   Viscous Xylocaine ASA CLASS:  DESCRIPTION OF PROCEDURE:     Physical exam was performed.  Informed consent was obtained from the patient after explaining the benefits, risks, and alternatives to the procedure.  The patient was connected to the monitor and placed in the left lateral position.  Continuous oxygen was provided by nasal cannula and IV medicine administered through an indwelling cannula.  After administration of sedation, the patients esophagus was intubated and the     endoscope was advanced under direct visualization to the second portion of the duodenum.  The scope was removed slowly by carefully examining the color, texture, anatomy, and integrity of the mucosa on the way out.  The patient was recovered in endoscopy and discharged home in satisfactory condition.    ESOPHAGUS: The mucosa of the esophagus appeared normal.  STOMACH: Mild non-erosive gastritis (inflammation) was found in the gastric antrum.  Multiple biopsies were performed using cold forceps. DUODENUM: The duodenal mucosa showed no abnormalities in the bulb and 2nd part of the duodenum.  Cold forceps biopsies were taken in the bulb and second portion. COMPLICATIONS: There were no immediate complications.  ENDOSCOPIC IMPRESSION: 1.   DYSPEPSIA DUE TO GERD/GASTRITIS 2.   MILD Non-erosive gastritis  RECOMMENDATIONS: TAKE OMEPRAZOLE 30 MINUTES PRIOR TO YOUR FIRST MEAL. AVOID TRIGGERS FOR REFLUX. FOLLOW A LOW FAT DIET. AWAIT BIOPSY. FOLLOW UP IN 4 MOS.  REPEAT EXAM: _______________________________ eSignedJonette Eva:  Shivaay Stormont, MD  01/11/2014 7:20 PM     CPT CODES: ICD CODES:  The ICD and CPT codes recommended by this software are interpretations from the data that the clinical staff has captured with the software.  The verification of the translation of this report to the ICD and CPT codes and modifiers is the sole responsibility of the health care institution and practicing physician where this report was generated.  [employer] will not be held responsible for the validity of the ICD and CPT codes included on this report.  AMA assumes no liability for data contained or not contained herein. CPT is a Publishing rights managerregistered trademark of the [employer].

## 2014-01-11 NOTE — Anesthesia Preprocedure Evaluation (Signed)
Anesthesia Evaluation  Patient identified by MRN, date of birth, ID band Patient awake    Reviewed: Allergy & Precautions, H&P , NPO status , Patient's Chart, lab work & pertinent test results  Airway Mallampati: I  TM Distance: >3 FB     Dental  (+) Teeth Intact   Pulmonary neg pulmonary ROS,  breath sounds clear to auscultation        Cardiovascular negative cardio ROS  Rhythm:Regular Rate:Normal     Neuro/Psych PSYCHIATRIC DISORDERS Anxiety    GI/Hepatic GERD-  Poorly Controlled,  Endo/Other    Renal/GU      Musculoskeletal   Abdominal   Peds  Hematology   Anesthesia Other Findings   Reproductive/Obstetrics                             Anesthesia Physical Anesthesia Plan  ASA: II  Anesthesia Plan: MAC   Post-op Pain Management:    Induction: Intravenous  Airway Management Planned: Simple Face Mask  Additional Equipment:   Intra-op Plan:   Post-operative Plan:   Informed Consent: I have reviewed the patients History and Physical, chart, labs and discussed the procedure including the risks, benefits and alternatives for the proposed anesthesia with the patient or authorized representative who has indicated his/her understanding and acceptance.     Plan Discussed with:   Anesthesia Plan Comments:         Anesthesia Quick Evaluation

## 2014-01-11 NOTE — Progress Notes (Signed)
REVIEWED-NO ADDITIONAL RECOMMENDATIONS. 

## 2014-01-11 NOTE — Discharge Instructions (Signed)
YOUR UPPER GI SYMPTOMS ARE MOST LIKELY DUE TO REFLUX AND GASTRITIS. You have gastritis. I biopsied your stomach AND SMALL BOWEL.   TAKE OMEPRAZOLE 30 MINUTES PRIOR TO YOUR FIRST MEAL.   AVOID TRIGGERS FOR REFLUX. SEE INFO BELOW.  FOLLOW A LOW FAT DIET. SEE INFO BELOW.  My office will contact you with your results WITHIN 14 DAYS OR YOU CAN LOOK THEM UP ON MY CHART AFTER DEC 24.  FOLLOW UP IN 4 MOS.  UPPER ENDOSCOPY AFTER CARE Read the instructions outlined below and refer to this sheet in the next week. These discharge instructions provide you with general information on caring for yourself after you leave the hospital. While your treatment has been planned according to the most current medical practices available, unavoidable complications occasionally occur. If you have any problems or questions after discharge, call DR. Lonie Rummell, [phone].  ACTIVITY  You may resume your regular activity, but move at a slower pace for the next 24 hours.   Take frequent rest periods for the next 24 hours.   Walking will help get rid of the air and reduce the bloated feeling in your belly (abdomen).   No driving for 24 hours (because of the medicine (anesthesia) used during the test).   You may shower.   Do not sign any important legal documents or operate any machinery for 24 hours (because of the anesthesia used during the test).    NUTRITION  Drink plenty of fluids.   You may resume your normal diet as instructed by your doctor.   Begin with a light meal and progress to your normal diet. Heavy or fried foods are harder to digest and may make you feel sick to your stomach (nauseated).   Avoid alcoholic beverages for 24 hours or as instructed.    MEDICATIONS  You may resume your normal medications.   WHAT YOU CAN EXPECT TODAY  Some feelings of bloating in the abdomen.   Passage of more gas than usual.    IF YOU HAD A BIOPSY TAKEN DURING THE UPPER ENDOSCOPY:  Eat a soft diet  IF YOU HAVE NAUSEA, BLOATING, ABDOMINAL PAIN, OR VOMITING.    FINDING OUT THE RESULTS OF YOUR TEST Not all test results are available during your visit. DR. Darrick PennaFIELDS WILL CALL YOU WITHIN 14 DAYS OF YOUR PROCEDUE WITH YOUR RESULTS. Do not assume everything is normal if you have not heard from DR. Azharia Surratt, CALL HER OFFICE AT [phone].  SEEK IMMEDIATE MEDICAL ATTENTION AND CALL THE OFFICE: [phone] IF:  You have more than a spotting of blood in your stool.   Your belly is swollen (abdominal distention).   You are nauseated or vomiting.   You have a temperature over 101F.   You have abdominal pain or discomfort that is severe or gets worse throughout the day.   Gastritis  Gastritis is an inflammation (the body's way of reacting to injury and/or infection) of the stomach. It is often caused by viral or bacterial (germ) infections. It can also be caused BY ASPIRIN, BC/GOODY POWDER'S, (IBUPROFEN) MOTRIN, OR ALEVE (NAPROXEN), chemicals (including alcohol), SPICY FOODS, and medications. This illness may be associated with generalized malaise (feeling tired, not well), UPPER ABDOMINAL STOMACH cramps, and fever. One common bacterial cause of gastritis is an organism known as H. Pylori. This can be treated with antibiotics.    Low-Fat Diet  BREADS, CEREALS, PASTA, RICE, DRIED PEAS, AND BEANS These products are high in carbohydrates and most are low in fat. Therefore, they  can be increased in the diet as substitutes for fatty foods. They too, however, contain calories and should not be eaten in excess. Cereals can be eaten for snacks as well as for breakfast.  Include foods that contain fiber (fruits, vegetables, whole grains, and legumes). Research shows that fiber may lower blood cholesterol levels, especially the water-soluble fiber found in fruits, vegetables, oat products, and legumes.  FRUITS AND VEGETABLES It is good to eat fruits and vegetables. Besides being sources of fiber, both are  rich in vitamins and some minerals. They help you get the daily allowances of these nutrients. Fruits and vegetables can be used for snacks and desserts.  MEATS Limit lean meat, chicken, [place], and fish to no more than 6 ounces per day.  Beef, Pork, and Lamb Use lean cuts of beef, pork, and lamb. Lean cuts include:  Extra-lean ground beef.  Arm roast.  Sirloin tip.  Center-cut ham.  Round steak.  Loin chops.  Rump roast.  Tenderloin.  Trim all fat off the outside of meats before cooking. It is not necessary to severely decrease the intake of red meat, but lean choices should be made. Lean meat is rich in protein and contains a highly absorbable form of iron. Premenopausal women, in particular, should avoid reducing lean red meat because this could increase the risk for low red blood cells (iron-deficiency anemia).  Chicken and [place] These are good sources of protein. The fat of poultry can be reduced by removing the skin and underlying fat layers before cooking. Chicken and [place] can be substituted for lean red meat in the diet. Poultry should not be fried or covered with high-fat sauces.  Fish and Shellfish Fish is a good source of protein. Shellfish contain cholesterol, but they usually are low in saturated fatty acids. The preparation of fish is important. Like chicken and [place], they should not be fried or covered with high-fat sauces.  EGGS Egg whites contain no fat or cholesterol. They can be eaten often. Try 1 to 2 egg whites instead of whole eggs in recipes or use egg substitutes that do not contain yolk.  MILK AND DAIRY PRODUCTS Use skim or 1% milk instead of 2% or whole milk. Decrease whole milk, natural, and processed cheeses. Use nonfat or low-fat (2%) cottage cheese or low-fat cheeses made from vegetable oils. Choose nonfat or low-fat (1 to 2%) yogurt. Experiment with evaporated skim milk in recipes that call for heavy cream. Substitute low-fat yogurt or low-fat cottage  cheese for sour cream in dips and salad dressings. Have at least 2 servings of low-fat dairy products, such as 2 glasses of skim (or 1%) milk each day to help get your daily calcium intake.  FATS AND OILS Reduce the total intake of fats, especially saturated fat. Butterfat, lard, and beef fats are high in saturated fat and cholesterol. These should be avoided as much as possible. Vegetable fats do not contain cholesterol, but certain vegetable fats, such as coconut oil, palm oil, and palm kernel oil are very high in saturated fats. These should be limited. These fats are often used in bakery goods, processed foods, popcorn, oils, and nondairy creamers. Vegetable shortenings and some peanut butters contain hydrogenated oils, which are also saturated fats. Read the labels on these foods and check for saturated vegetable oils.  Unsaturated vegetable oils and fats do not raise blood cholesterol. However, they should be limited because they are fats and are high in calories. Total fat should still be limited to  30% of your daily caloric intake. Desirable liquid vegetable oils are corn oil, cottonseed oil, olive oil, canola oil, safflower oil, soybean oil, and sunflower oil. Peanut oil is not as good, but small amounts are acceptable. Buy a heart-healthy tub margarine that has no partially hydrogenated oils in the ingredients. Mayonnaise and salad dressings often are made from unsaturated fats, but they should also be limited because of their high calorie and fat content. Seeds, nuts, peanut butter, olives, and avocados are high in fat, but the fat is mainly the unsaturated type. These foods should be limited mainly to avoid excess calories and fat.  OTHER EATING TIPS Snacks  Most sweets should be limited as snacks. They tend to be rich in calories and fats, and their caloric content outweighs their nutritional value. Some good choices in snacks are graham crackers, melba toast, soda crackers, bagels (no egg),  English muffins, fruits, and vegetables. These snacks are preferable to snack crackers, [place] fries, and chips. Popcorn should be air-popped or cooked in small amounts of liquid vegetable oil.  Desserts Eat fruit, low-fat yogurt, and fruit ices instead of pastries, cake, and cookies. Sherbet, angel food cake, gelatin dessert, frozen low-fat yogurt, or other frozen products that do not contain saturated fat (pure fruit juice bars, frozen ice pops) are also acceptable.   COOKING METHODS Choose those methods that use little or no fat. They include: Poaching.  Braising.  Steaming.  Grilling.  Baking.  Stir-frying.  Broiling.  Microwaving.  Foods can be cooked in a nonstick pan without added fat, or use a nonfat cooking spray in regular cookware. Limit fried foods and avoid frying in saturated fat. Add moisture to lean meats by using water, broth, cooking wines, and other nonfat or low-fat sauces along with the cooking methods mentioned above. Soups and stews should be chilled after cooking. The fat that forms on top after a few hours in the refrigerator should be skimmed off. When preparing meals, avoid using excess salt. Salt can contribute to raising blood pressure in some people.  EATING AWAY FROM HOME Order entres, potatoes, and vegetables without sauces or butter. When meat exceeds the size of a deck of cards (3 to 4 ounces), the rest can be taken home for another meal. Choose vegetable or fruit salads and ask for low-calorie salad dressings to be served on the side. Use dressings sparingly. Limit high-fat toppings, such as bacon, crumbled eggs, cheese, sunflower seeds, and olives. Ask for heart-healthy tub margarine instead of butter.

## 2014-01-11 NOTE — Transfer of Care (Signed)
Immediate Anesthesia Transfer of Care Note  Patient: Lisa Christian  Procedure(s) Performed: Procedure(s) with comments: ESOPHAGOGASTRODUODENOSCOPY (EGD) WITH PROPOFOL (N/A) BIOPSY (N/A) - duodenal and gastric  Patient Location: PACU  Anesthesia Type:MAC  Level of Consciousness: awake, alert , oriented and patient cooperative  Airway & Oxygen Therapy: Patient Spontanous Breathing and Patient connected to face mask oxygen  Post-op Assessment: Report given to PACU RN and Post -op Vital signs reviewed and stable  Post vital signs: Reviewed and stable  Complications: No apparent anesthesia complications

## 2014-01-11 NOTE — Anesthesia Postprocedure Evaluation (Signed)
  Anesthesia Post-op Note  Patient: Lisa Christian  Procedure(s) Performed: Procedure(s) with comments: ESOPHAGOGASTRODUODENOSCOPY (EGD) WITH PROPOFOL (N/A) BIOPSY (N/A) - duodenal and gastric  Patient Location: PACU  Anesthesia Type:MAC  Level of Consciousness: awake, alert , oriented and patient cooperative  Airway and Oxygen Therapy: Patient Spontanous Breathing  Post-op Pain: mild  Post-op Assessment: Post-op Vital signs reviewed, Patient's Cardiovascular Status Stable, Respiratory Function Stable, Patent Airway, No signs of Nausea or vomiting, Adequate PO intake, Pain level controlled, No headache, No backache, No residual numbness and No residual motor weakness  Post-op Vital Signs: Reviewed and stable  Last Vitals:  Filed Vitals:   01/11/14 1125  BP: 98/57  Pulse:   Temp:   Resp: 30    Complications: No apparent anesthesia complications

## 2014-01-12 ENCOUNTER — Encounter (HOSPITAL_COMMUNITY): Payer: Self-pay | Admitting: Gastroenterology

## 2014-01-21 NOTE — L&D Delivery Note (Signed)
Patient is 24 y.o. G1P0 5780w2d admitted for latent labor after PROM, augmented with pitocin. Patient had uncomplicated prenatal course.    Delivery Note At 8:29 AM a viable and healthy female was delivered via Vaginal, Spontaneous Delivery (Presentation: Left Occiput Anterior).  Turtle-ing noted after delivery of the head, shoulder reduced with McRoberts maneuver and anterior sling. APGAR: 9, 9; weight pending.   Placenta status: Intact, Spontaneous.  Cord: 3 vessels with the following complications: None.  Cord pH: pending  Anesthesia: Epidural  Episiotomy: None Lacerations: 2nd degree Suture Repair: 3.0 vicryl Est. Blood Loss (mL):  296 cc  Mom to postpartum.  Baby to Couplet care / Skin to Skin  .  Olivia CanterAmanda Schultz 01/10/2015, 9:06 AM  OB fellow attestation: Patient is a G1P0 at 1180w2d who was admitted with PROM, uncomplicated prenatal course.  She progressed with augmentation via Pitocin.  I was gloved and present for delivery in its entirety.  Second stage of labor progressed. variable decels during second stage noted.  Complications: none  Lacerations: 2nd degree, deep. Rectal exam performed and external sphincter intact. Repaired in typical  fashion.   EBL: 296cc  Federico FlakeKimberly Niles Jaxsin Bottomley, MD 9:47 AM

## 2014-01-27 ENCOUNTER — Telehealth: Payer: Self-pay

## 2014-01-27 NOTE — Telephone Encounter (Signed)
Pt is calling to see if her results are back from her EGD. Please advise

## 2014-01-31 NOTE — Telephone Encounter (Signed)
Please call pt. HER stomach Bx shows mild gastritis. UPPER GI SYMPTOMS ARE MOST LIKELY DUE TO REFLUX AND GASTRITIS.  HER SMALL BOWEL BIOPSIES ARE NORMAL.   TAKE OMEPRAZOLE 30 MINUTES PRIOR TO YOUR FIRST MEAL. IF HER REFLUX AND REGURGITATION ARE NOT CONTROLLED SHE MAY TRY OMEPRAZOLE OR PROTONIX 30 MINS PRIOR TO MEALS TWICE DAILY.  STRICTLY AVOID TRIGGERS FOR REFLUX.  STRICTLY FOLLOW A LOW FAT DIET.   FOLLOW UP IN 4 MOS E30 UNCONTROLLED GERD/REGURGITATION..Marland Kitchen

## 2014-02-01 ENCOUNTER — Encounter: Payer: Self-pay | Admitting: Gastroenterology

## 2014-02-01 NOTE — Telephone Encounter (Signed)
PATIENT SCHEDULED AND LETTER SENT  °

## 2014-02-01 NOTE — Telephone Encounter (Signed)
PATIENT SCHEDULED FOR April

## 2014-04-11 ENCOUNTER — Encounter: Payer: Self-pay | Admitting: Obstetrics and Gynecology

## 2014-04-11 ENCOUNTER — Other Ambulatory Visit: Payer: Self-pay | Admitting: Obstetrics and Gynecology

## 2014-04-25 ENCOUNTER — Encounter: Payer: Self-pay | Admitting: Obstetrics and Gynecology

## 2014-04-25 ENCOUNTER — Other Ambulatory Visit (HOSPITAL_COMMUNITY)
Admission: RE | Admit: 2014-04-25 | Discharge: 2014-04-25 | Disposition: A | Discharge status: Home / Self Care | Payer: MEDICAID | Source: Ambulatory Visit | Attending: Obstetrics and Gynecology | Admitting: Obstetrics and Gynecology

## 2014-04-25 ENCOUNTER — Ambulatory Visit (INDEPENDENT_AMBULATORY_CARE_PROVIDER_SITE_OTHER): Payer: 59 | Admitting: Obstetrics and Gynecology

## 2014-04-25 VITALS — BP 132/76 | Ht 65.0 in | Wt 165.0 lb

## 2014-04-25 DIAGNOSIS — Z113 Encounter for screening for infections with a predominantly sexual mode of transmission: Secondary | ICD-10-CM | POA: Insufficient documentation

## 2014-04-25 DIAGNOSIS — N870 Mild cervical dysplasia: Secondary | ICD-10-CM | POA: Insufficient documentation

## 2014-04-25 DIAGNOSIS — Z01419 Encounter for gynecological examination (general) (routine) without abnormal findings: Secondary | ICD-10-CM

## 2014-04-25 DIAGNOSIS — Z0000 Encounter for general adult medical examination without abnormal findings: Secondary | ICD-10-CM

## 2014-04-25 DIAGNOSIS — R87619 Unspecified abnormal cytological findings in specimens from cervix uteri: Secondary | ICD-10-CM | POA: Insufficient documentation

## 2014-04-25 MED ORDER — NORGESTIM-ETH ESTRAD TRIPHASIC 0.18/0.215/0.25 MG-25 MCG PO TABS: 1.0000 | ORAL_TABLET | Freq: Every day | ORAL | Status: DC

## 2014-04-25 NOTE — Progress Notes (Signed)
Patient ID: Lisa Christian, female   DOB: 05-25-1990, 24 y.o.   MRN: [id number] Pt her today for annual exam. [facility] FearFerguson. Periods have been more irregular since being off her BCP. Pt last had unprotected sex a couple weeks and protected sex four days ago.

## 2014-04-25 NOTE — Addendum Note (Signed)
Addended by: Richardson ChiquitoRAVIS, Elainah Rhyne M on: 04/25/2014 04:08 PM   Modules accepted: Orders

## 2014-04-25 NOTE — Progress Notes (Signed)
Patient ID: Lisa Christian, female   DOB: 30-Jun-1990, 24 y.o.   MRN: [id number]  Assessment:  Annual Gyn Exam   Plan:  1. pap smear done, next pap due 1 year to follow-up with 2014 abnormal pap CIN I 2. return annually or prn 3    Annual mammogram advised at 40 4.   Renew ortho tri cyclen prescription for 1 year Subjective:  Lisa Christian is a 24 y.o. female G0P0 who presents for annual exam. Patient's last menstrual period was 04/14/2014. The patient has no complaints today. Pt had an abnormal pap in 2014. Her colposcopy 1 month later showed mild dysplasia. Pt is here today for 1 year follow-up of pap smear and HPV testing.  The following portions of the patient's history were reviewed and updated as appropriate: allergies, current medications, past family history, past medical history, past social history, past surgical history and problem list. Past Medical History  Diagnosis Date  . Anxiety   . Panic attacks   . GERD (gastroesophageal reflux disease)     Past Surgical History  Procedure Laterality Date  . No past surgeries    . Esophagogastroduodenoscopy (egd) with propofol N/A 01/11/2014    Procedure: ESOPHAGOGASTRODUODENOSCOPY (EGD) WITH PROPOFOL;  Surgeon: West BaliSandi L Fields, MD;  Location: [facility];  Service: Endoscopy;  Laterality: N/A;  . Esophageal biopsy N/A 01/11/2014    Procedure: BIOPSY;  Surgeon: West BaliSandi L Fields, MD;  Location: [facility];  Service: Endoscopy;  Laterality: N/A;  duodenal and gastric    No current outpatient prescriptions on file.  Review of Systems Constitutional: negative Gastrointestinal: negative Genitourinary: negative  Objective:  BP 132/76 mmHg  Ht 5\' 5"  (1.651 m)  Wt 165 lb (74.844 kg)  BMI 27.46 kg/m2  LMP 04/14/2014   BMI: Body mass index is 27.46 kg/(m^2).  General Appearance: Alert, appropriate appearance for age. No acute distress HEENT: Grossly normal Neck / Thyroid:  Cardiovascular: RRR; normal S1, S2, no murmur Lungs: CTA  bilaterally Breast: Normal appearing; no masses or nodes; no nipple discharge, dimpling or retraction; tissue even Back: No CVAT Gastrointestinal: Soft, non-tender, no masses or organomegaly Pelvic Exam: Vulva and vagina appear normal. Bimanual exam reveals normal uterus and adnexa. External genitalia: normal general appearance Vaginal: normal mucosa without prolapse or lesions Cervix: normal appearance Adnexa: normal bimanual exam Uterus: normal single, nontender Rectovaginal: not indicated Lymphatic Exam: Non-palpable nodes in neck, clavicular, axillary, or inguinal regions  Skin: no rash or abnormalities Neurologic: Normal gait and speech, no tremor  Psychiatric: Alert and oriented, appropriate affect.  Urinalysis:Not done  Christin BachJohn Matea Stanard. MD Pgr [phone] 3:28 PM    This chart was scribed for Tilda BurrowJohn Advik Weatherspoon V, MD by Gwenyth Oberatherine Macek, ED Scribe. This patient was seen in room 1 and the patient's care was started at 3:28 PM.   I personally performed the services described in this documentation, which was SCRIBED in my presence. The recorded information has been reviewed and considered accurate. It has been edited as necessary during review. Tilda BurrowFERGUSON,Treva Huyett V, MD

## 2014-04-27 ENCOUNTER — Telehealth: Payer: Self-pay | Admitting: Obstetrics and Gynecology

## 2014-04-27 LAB — CYTOLOGY - PAP

## 2014-04-27 NOTE — Telephone Encounter (Signed)
Pt wanted to know when she needed to start her BCP. I spoke with Dr. Emelda FearFerguson and he advised that she could go ahead and start them today. Pt verbalized understanding.

## 2014-05-13 ENCOUNTER — Ambulatory Visit: Payer: BC Managed Care – PPO | Admitting: Gastroenterology

## 2014-06-03 ENCOUNTER — Encounter: Payer: Self-pay | Admitting: Gastroenterology

## 2014-06-03 ENCOUNTER — Telehealth: Payer: Self-pay | Admitting: Gastroenterology

## 2014-06-03 ENCOUNTER — Ambulatory Visit: Payer: Self-pay | Admitting: Gastroenterology

## 2014-06-03 NOTE — Telephone Encounter (Signed)
PATIENT WAS A NO SHOW AND LETTER WAS SENT  °

## 2014-06-27 ENCOUNTER — Other Ambulatory Visit: Payer: Self-pay | Admitting: Obstetrics and Gynecology

## 2014-06-27 ENCOUNTER — Other Ambulatory Visit: Payer: 59

## 2014-06-27 ENCOUNTER — Ambulatory Visit (INDEPENDENT_AMBULATORY_CARE_PROVIDER_SITE_OTHER): Payer: Medicaid Other

## 2014-06-27 DIAGNOSIS — Z36 Encounter for antenatal screening of mother: Secondary | ICD-10-CM | POA: Diagnosis not present

## 2014-06-27 DIAGNOSIS — O3680X1 Pregnancy with inconclusive fetal viability, fetus 1: Secondary | ICD-10-CM

## 2014-06-27 DIAGNOSIS — Z3682 Encounter for antenatal screening for nuchal translucency: Secondary | ICD-10-CM

## 2014-06-27 NOTE — Progress Notes (Signed)
US 12+1wks single IUP,pos fht 158bpm,normal ov's bilat,nb present,nt 1.4117mm,crl 58.314mm

## 2014-06-29 LAB — MATERNAL SCREEN, INTEGRATED #1
Crown Rump Length: 58.4 mm
Gest. Age on Collection Date: 12.3 weeks
Maternal Age at EDD: 24.2 years
Nuchal Translucency (NT): 1.1 mm
Number of Fetuses: 1
PAPP-A Value: 1678.2 ng/mL
WEIGHT: 163 [lb_av]

## 2014-07-05 ENCOUNTER — Encounter: Payer: Self-pay | Admitting: Women's Health

## 2014-07-05 ENCOUNTER — Ambulatory Visit (INDEPENDENT_AMBULATORY_CARE_PROVIDER_SITE_OTHER): Payer: Medicaid Other | Admitting: Women's Health

## 2014-07-05 VITALS — BP 114/60 | HR 72 | Ht 66.0 in | Wt 161.8 lb

## 2014-07-05 DIAGNOSIS — Z0283 Encounter for blood-alcohol and blood-drug test: Secondary | ICD-10-CM

## 2014-07-05 DIAGNOSIS — Z3401 Encounter for supervision of normal first pregnancy, first trimester: Secondary | ICD-10-CM

## 2014-07-05 DIAGNOSIS — Z3400 Encounter for supervision of normal first pregnancy, unspecified trimester: Secondary | ICD-10-CM | POA: Insufficient documentation

## 2014-07-05 DIAGNOSIS — Z331 Pregnant state, incidental: Secondary | ICD-10-CM

## 2014-07-05 DIAGNOSIS — Z369 Encounter for antenatal screening, unspecified: Secondary | ICD-10-CM

## 2014-07-05 DIAGNOSIS — Z1389 Encounter for screening for other disorder: Secondary | ICD-10-CM

## 2014-07-05 LAB — POCT URINALYSIS DIPSTICK
Blood, UA: NEGATIVE
Glucose, UA: NEGATIVE
Ketones, UA: NEGATIVE
Leukocytes, UA: NEGATIVE
Nitrite, UA: NEGATIVE
PROTEIN UA: NEGATIVE

## 2014-07-05 NOTE — Patient Instructions (Signed)

## 2014-07-05 NOTE — Progress Notes (Signed)
  Subjective:  Lisa Christian is a 24 y.o. G1P0 Caucasian female at [redacted]w[redacted]d by 12wk u/s, being seen today for her first obstetrical visit.  Her obstetrical history is significant for primigravida.  Pregnancy history fully reviewed.  Patient reports mild nausea- getting better- declines meds. Occ scant spotting.. Denies cramping, uti s/s, abnormal/malodorous vag d/c, or vulvovaginal itching/irritation.  BP 114/60 mmHg  Pulse 72  Wt 161 lb 12.8 oz (73.392 kg)  LMP 04/14/2014 (Exact Date)  HISTORY: OB History  Gravida Para Term Preterm AB SAB TAB Ectopic Multiple Living  1         0    # Outcome Date GA Lbr Len/2nd Weight Sex Delivery Anes PTL Lv  1 Current              Past Medical History  Diagnosis Date  . Anxiety   . Panic attacks   . GERD (gastroesophageal reflux disease)    Past Surgical History  Procedure Laterality Date  . No past surgeries    . Esophagogastroduodenoscopy (egd) with propofol N/A 01/11/2014    [id number] non-erosive  . Esophageal biopsy N/A 01/11/2014    Procedure: BIOPSY;  Surgeon: West Bali, MD;  Location: [facility];  Service: Endoscopy;  Laterality: N/A;  duodenal and gastric   Family History  Problem Relation Age of Onset  . Anxiety disorder Mother   . Heart disease Maternal Grandfather   . Colon cancer Other   . Cancer Maternal Grandmother     breast    Exam   System:     General: Well developed & nourished, no acute distress   Skin: Warm & dry, normal coloration and turgor, no rashes   Neurologic: Alert & oriented, normal mood   Cardiovascular: Regular rate & rhythm   Respiratory: Effort & rate normal, LCTAB, acyanotic   Abdomen: Soft, non tender   Extremities: normal strength, tone  Thin prep pap smear neg 04/2014 FHR: 143 via doppler   Assessment:   Pregnancy: G1P0 Patient Active Problem List   Diagnosis Date Noted  . Supervision of normal first pregnancy 07/05/2014    Priority: High  . CIN I (cervical intraepithelial  neoplasia I) 04/25/2014  . Annual physical exam 04/25/2014  . GERD (gastroesophageal reflux disease) 11/24/2013  . 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 Regurgitation 11/24/2013    [redacted]w[redacted]d G1P0 New OB visit   Plan:  Initial labs drawn Continue prenatal vitamins Problem list reviewed and updated Reviewed n/v relief measures and warning s/s to report Reviewed recommended weight gain based on pre-gravid BMI Encouraged well-balanced diet Genetic Screening discussed Integrated Screen: requested, 1st IT done last week Cystic fibrosis screening discussed requested Ultrasound discussed; fetal survey: requested Follow up in 3 weeks for visit and 2nd IT CCNC completed  Marge Duncans CNM, [facility] 07/05/2014 11:35 AM

## 2014-07-06 LAB — URINE CULTURE

## 2014-07-07 LAB — GC/CHLAMYDIA PROBE AMP
Chlamydia trachomatis, NAA: NEGATIVE
NEISSERIA GONORRHOEAE BY PCR: NEGATIVE

## 2014-07-11 ENCOUNTER — Telehealth: Payer: Self-pay | Admitting: Women's Health

## 2014-07-11 LAB — URINALYSIS, ROUTINE W REFLEX MICROSCOPIC
Bilirubin, UA: NEGATIVE
GLUCOSE, UA: NEGATIVE
KETONES UA: NEGATIVE
Leukocytes, UA: NEGATIVE
NITRITE UA: NEGATIVE
PH UA: 7.5 (ref 5.0–7.5)
Protein, UA: NEGATIVE
Specific Gravity, UA: 1.018 (ref 1.005–1.030)
UUROB: 1 mg/dL (ref 0.2–1.0)

## 2014-07-11 LAB — PMP SCREEN PROFILE (10S), URINE
Amphetamine Screen, Ur: NEGATIVE ng/mL
Barbiturate Screen, Ur: NEGATIVE ng/mL
Benzodiazepine Screen, Urine: NEGATIVE ng/mL
COCAINE(METAB.) SCREEN, URINE: NEGATIVE ng/mL
Cannabinoids Ur Ql Scn: NEGATIVE ng/mL
Creatinine(Crt), U: 127.2 mg/dL (ref 20.0–300.0)
METHADONE SCREEN, URINE: NEGATIVE ng/mL
OXYCODONE+OXYMORPHONE UR QL SCN: NEGATIVE ng/mL
Opiate Scrn, Ur: NEGATIVE ng/mL
PCP SCRN UR: NEGATIVE ng/mL
PH UR, DRUG SCRN: 7.1 (ref 4.5–8.9)
Propoxyphene, Screen: NEGATIVE ng/mL

## 2014-07-11 LAB — VARICELLA ZOSTER ANTIBODY, IGG: VARICELLA: 2357 {index} (ref >165)

## 2014-07-11 LAB — CBC
Hematocrit: 36.4 % (ref 34.0–46.6)
Hemoglobin: 12.3 g/dL (ref 11.1–15.9)
MCH: 32.1 pg (ref 26.6–33.0)
MCHC: 33.8 g/dL (ref 31.5–35.7)
MCV: 95 fL (ref 79–97)
PLATELETS: 213 10*3/uL (ref 150–379)
RBC: 3.83 x10E6/uL (ref 3.77–5.28)
RDW: 14 % (ref 12.3–15.4)
WBC: 9.2 10*3/uL (ref 3.4–10.8)

## 2014-07-11 LAB — MICROSCOPIC EXAMINATION
CASTS: NONE SEEN /LPF
Epithelial Cells (non renal): >10 /hpf — AB (ref 0–10)

## 2014-07-11 LAB — RUBELLA SCREEN: RUBELLA: 1.77 {index} (ref >0.99)

## 2014-07-11 LAB — ABO/RH: Rh Factor: POSITIVE

## 2014-07-11 LAB — CYSTIC FIBROSIS MUTATION 97: GENE DIS ANAL CARRIER INTERP BLD/T-IMP: NOT DETECTED

## 2014-07-11 LAB — RPR: RPR: NONREACTIVE

## 2014-07-11 LAB — ANTIBODY SCREEN: Antibody Screen: NEGATIVE

## 2014-07-11 LAB — HIV ANTIBODY (ROUTINE TESTING W REFLEX): HIV SCREEN 4TH GENERATION: NONREACTIVE

## 2014-07-11 LAB — HEPATITIS B SURFACE ANTIGEN: Hepatitis B Surface Ag: NEGATIVE

## 2014-07-12 ENCOUNTER — Telehealth: Payer: Self-pay | Admitting: Women's Health

## 2014-07-12 NOTE — Telephone Encounter (Signed)
Returning pt's call, no answer x 2- left message.  Cheral Marker, CNM, [facility] 07/12/2014 8:56 AM

## 2014-07-12 NOTE — Telephone Encounter (Signed)
Pt returned Kim's call and was advised per Selena Batten to make an appointment with her dentist and stop by the office and pick up the letter to take to the dentist for treatment.   Pt verbalized understanding.

## 2014-07-26 ENCOUNTER — Ambulatory Visit (INDEPENDENT_AMBULATORY_CARE_PROVIDER_SITE_OTHER): Payer: Medicaid Other | Admitting: Obstetrics & Gynecology

## 2014-07-26 ENCOUNTER — Encounter: Payer: Self-pay | Admitting: Obstetrics & Gynecology

## 2014-07-26 VITALS — BP 110/62 | HR 84 | Wt 163.0 lb

## 2014-07-26 DIAGNOSIS — Z3492 Encounter for supervision of normal pregnancy, unspecified, second trimester: Secondary | ICD-10-CM

## 2014-07-26 DIAGNOSIS — Z1389 Encounter for screening for other disorder: Secondary | ICD-10-CM

## 2014-07-26 DIAGNOSIS — Z331 Pregnant state, incidental: Secondary | ICD-10-CM

## 2014-07-26 DIAGNOSIS — Z369 Encounter for antenatal screening, unspecified: Secondary | ICD-10-CM

## 2014-07-26 LAB — POCT URINALYSIS DIPSTICK
Blood, UA: NEGATIVE
Glucose, UA: NEGATIVE
KETONES UA: NEGATIVE
LEUKOCYTES UA: NEGATIVE
Nitrite, UA: NEGATIVE
PROTEIN UA: NEGATIVE

## 2014-07-26 NOTE — Progress Notes (Signed)
G1P0 4933w2d Estimated Date of Delivery: 01/08/15  Blood pressure 110/62, pulse 84, weight 163 lb (73.936 kg), last menstrual period 04/14/2014.   BP weight and urine results all reviewed and noted.  Please refer to the obstetrical flow sheet for the fundal height and fetal heart rate documentation:  Patient reports good fetal movement, denies any bleeding and no rupture of membranes symptoms or regular contractions. Patient is without complaints. All questions were answered.  Plan:  Continued routine obstetrical care,   Follow up in 4 weeks for OB appointment, sonogram  2nd IT today

## 2014-07-26 NOTE — Progress Notes (Signed)
Pt denies any problems or concerns at this time.  

## 2014-07-28 LAB — MATERNAL SCREEN, INTEGRATED #2
ADSF: 0.61
AFP MoM: 0.88
Alpha-Fetoprotein: 26.7 ng/mL
Crown Rump Length: 58.4 mm
DIA MoM: 0.86
DIA VALUE: 141.5 pg/mL
Estriol, Unconjugated: 0.56 ng/mL
GEST. AGE ON COLLECTION DATE: 12.3 wk
Gestational Age: 16.4 weeks
HCG MOM: 1.22
HCG VALUE: 37.6 [IU]/mL
MATERNAL AGE AT EDD: 24.2 a
Nuchal Translucency (NT): 1.1 mm
Nuchal Translucency MoM: 0.87
Number of Fetuses: 1
PAPP-A MoM: 2.02
PAPP-A Value: 1678.2 ng/mL
Test Results:: NEGATIVE
WEIGHT: 163 [lb_av]
Weight: 163 [lb_av]

## 2014-08-15 ENCOUNTER — Other Ambulatory Visit: Payer: Self-pay | Admitting: Obstetrics & Gynecology

## 2014-08-15 DIAGNOSIS — Z1389 Encounter for screening for other disorder: Secondary | ICD-10-CM

## 2014-08-23 ENCOUNTER — Ambulatory Visit (INDEPENDENT_AMBULATORY_CARE_PROVIDER_SITE_OTHER): Payer: Medicaid Other

## 2014-08-23 ENCOUNTER — Encounter (HOSPITAL_COMMUNITY): Payer: Self-pay

## 2014-08-23 ENCOUNTER — Emergency Department (HOSPITAL_COMMUNITY)
Admission: EM | Admit: 2014-08-23 | Discharge: 2014-08-23 | Disposition: A | Discharge status: Home / Self Care | Payer: Medicaid Other | Attending: Emergency Medicine | Admitting: Emergency Medicine

## 2014-08-23 ENCOUNTER — Ambulatory Visit (INDEPENDENT_AMBULATORY_CARE_PROVIDER_SITE_OTHER): Payer: Medicaid Other | Admitting: Advanced Practice Midwife

## 2014-08-23 ENCOUNTER — Encounter: Payer: Self-pay | Admitting: Advanced Practice Midwife

## 2014-08-23 VITALS — BP 100/60 | HR 88 | Wt 166.0 lb

## 2014-08-23 DIAGNOSIS — Z8719 Personal history of other diseases of the digestive system: Secondary | ICD-10-CM | POA: Diagnosis not present

## 2014-08-23 DIAGNOSIS — Y9389 Activity, other specified: Secondary | ICD-10-CM | POA: Diagnosis not present

## 2014-08-23 DIAGNOSIS — Z3A20 20 weeks gestation of pregnancy: Secondary | ICD-10-CM | POA: Diagnosis not present

## 2014-08-23 DIAGNOSIS — Z1389 Encounter for screening for other disorder: Secondary | ICD-10-CM

## 2014-08-23 DIAGNOSIS — Y998 Other external cause status: Secondary | ICD-10-CM | POA: Diagnosis not present

## 2014-08-23 DIAGNOSIS — S39012A Strain of muscle, fascia and tendon of lower back, initial encounter: Secondary | ICD-10-CM | POA: Diagnosis not present

## 2014-08-23 DIAGNOSIS — Z79899 Other long term (current) drug therapy: Secondary | ICD-10-CM | POA: Diagnosis not present

## 2014-08-23 DIAGNOSIS — Y92410 Unspecified street and highway as the place of occurrence of the external cause: Secondary | ICD-10-CM | POA: Diagnosis not present

## 2014-08-23 DIAGNOSIS — Z331 Pregnant state, incidental: Secondary | ICD-10-CM

## 2014-08-23 DIAGNOSIS — Z3402 Encounter for supervision of normal first pregnancy, second trimester: Secondary | ICD-10-CM

## 2014-08-23 DIAGNOSIS — O9A212 Injury, poisoning and certain other consequences of external causes complicating pregnancy, second trimester: Secondary | ICD-10-CM | POA: Insufficient documentation

## 2014-08-23 DIAGNOSIS — Z8659 Personal history of other mental and behavioral disorders: Secondary | ICD-10-CM | POA: Insufficient documentation

## 2014-08-23 DIAGNOSIS — Z36 Encounter for antenatal screening of mother: Secondary | ICD-10-CM | POA: Diagnosis not present

## 2014-08-23 LAB — POCT URINALYSIS DIPSTICK
Glucose, UA: NEGATIVE
Ketones, UA: NEGATIVE
Leukocytes, UA: NEGATIVE
Nitrite, UA: NEGATIVE
Protein, UA: NEGATIVE
RBC UA: NEGATIVE

## 2014-08-23 NOTE — Progress Notes (Addendum)
[place] 20+2WKS,measurements c/w dates,efw 357g,normal ov's bilat,cephalic,ant pl gr 0, svp 5cm,cx 4.1cm,LV EICF 2.23mm,anatomy complete
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
## 2014-08-23 NOTE — Progress Notes (Signed)
G1P0 [redacted]w[redacted]d Estimated Date of Delivery: 01/08/15  Blood pressure 100/60, pulse 88, weight 166 lb (75.297 kg), last menstrual period 04/14/2014.   BP weight and urine results all reviewed and noted.  Please refer to the obstetrical flow sheet for the fundal height and fetal heart rate documentation: anatomy scan Patient reports good fetal movement, denies any bleeding and no rupture of membranes symptoms or regular contractions. Patient is without complaints. Has hx of anxitey and has a lot of irrational thoughts.  Has not seen therapist in a while.  All questions were answered.  Plan:  Continued routine obstetrical care,  Make appt with therapist   Follow up in 4 weeks for OB appointment,

## 2014-08-23 NOTE — Discharge Instructions (Signed)

## 2014-08-23 NOTE — ED Notes (Signed)
Dr Emelda Fear at bedside to assess patient - he states okay to discontinue fetal heart monitoring.
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
## 2014-09-15 ENCOUNTER — Encounter: Payer: Medicaid Other | Admitting: Advanced Practice Midwife

## 2014-09-20 ENCOUNTER — Encounter: Payer: Self-pay | Admitting: Women's Health

## 2014-09-20 ENCOUNTER — Ambulatory Visit (INDEPENDENT_AMBULATORY_CARE_PROVIDER_SITE_OTHER): Payer: Medicaid Other | Admitting: Women's Health

## 2014-09-20 VITALS — BP 104/58 | HR 76 | Wt 171.0 lb

## 2014-09-20 DIAGNOSIS — Z331 Pregnant state, incidental: Secondary | ICD-10-CM

## 2014-09-20 DIAGNOSIS — O283 Abnormal ultrasonic finding on antenatal screening of mother: Secondary | ICD-10-CM

## 2014-09-20 DIAGNOSIS — Z1389 Encounter for screening for other disorder: Secondary | ICD-10-CM

## 2014-09-20 DIAGNOSIS — Z3402 Encounter for supervision of normal first pregnancy, second trimester: Secondary | ICD-10-CM

## 2014-09-20 LAB — POCT URINALYSIS DIPSTICK
GLUCOSE UA: NEGATIVE
KETONES UA: NEGATIVE
Leukocytes, UA: NEGATIVE
Nitrite, UA: NEGATIVE
Protein, UA: NEGATIVE
RBC UA: NEGATIVE

## 2014-09-20 NOTE — Patient Instructions (Signed)
You will have your sugar test next visit.  Please do not eat or drink anything after midnight the night before you come, not even water.  You will be here for at least two hours.     Call the office ([phone]) or go to [facility] if:  You begin to have strong, frequent contractions  Your water breaks.  Sometimes it is a big gush of fluid, sometimes it is just a trickle that keeps getting your panties wet or running down your legs  You have vaginal bleeding.  It is normal to have a small amount of spotting if your cervix was checked.   You don't feel your baby moving like normal.  If you don't, get you something to eat and drink and lay down and focus on feeling your baby move.   If your baby is still not moving like normal, you should call the office or go to [facility].  Second Trimester of Pregnancy The second trimester is from week 13 through week 28, months 4 through 6. The second trimester is often a time when you feel your best. Your body has also adjusted to being pregnant, and you begin to feel better physically. Usually, morning sickness has lessened or quit completely, you may have more energy, and you may have an increase in appetite. The second trimester is also a time when the fetus is growing rapidly. At the end of the sixth month, the fetus is about 9 inches long and weighs about 1 pounds. You will likely begin to feel the baby move (quickening) between 18 and 20 weeks of the pregnancy. BODY CHANGES Your body goes through many changes during pregnancy. The changes vary from woman to woman.   Your weight will continue to increase. You will notice your lower abdomen bulging out.  You may begin to get stretch marks on your hips, abdomen, and breasts.  You may develop headaches that can be relieved by medicines approved by your health care provider.  You may urinate more often because the fetus is pressing on your bladder.  You may develop or continue to have  heartburn as a result of your pregnancy.  You may develop constipation because certain hormones are causing the muscles that push waste through your intestines to slow down.  You may develop hemorrhoids or swollen, bulging veins (varicose veins).  You may have back pain because of the weight gain and pregnancy hormones relaxing your joints between the bones in your pelvis and as a result of a shift in weight and the muscles that support your balance.  Your breasts will continue to grow and be tender.  Your gums may bleed and may be sensitive to brushing and flossing.  Dark spots or blotches (chloasma, mask of pregnancy) may develop on your face. This will likely fade after the baby is born.  A dark line from your belly button to the pubic area (linea nigra) may appear. This will likely fade after the baby is born.  You may have changes in your hair. These can include thickening of your hair, rapid growth, and changes in texture. Some women also have hair loss during or after pregnancy, or hair that feels dry or thin. Your hair will most likely return to normal after your baby is born. WHAT TO EXPECT AT YOUR PRENATAL VISITS During a routine prenatal visit:  You will be weighed to make sure you and the fetus are growing normally.  Your blood pressure will be taken.    Your abdomen will be measured to track your baby's growth.  The fetal heartbeat will be listened to.  Any test results from the previous visit will be discussed. Your health care provider may ask you:  How you are feeling.  If you are feeling the baby move.  If you have had any abnormal symptoms, such as leaking fluid, bleeding, severe headaches, or abdominal cramping.  If you have any questions. Other tests that may be performed during your second trimester include:  Blood tests that check for:  Low iron levels (anemia).  Gestational diabetes (between 24 and 28 weeks).  Rh antibodies.  Urine tests to check  for infections, diabetes, or protein in the urine.  An ultrasound to confirm the proper growth and development of the baby.  An amniocentesis to check for possible genetic problems.  Fetal screens for spina bifida and Down syndrome. HOME CARE INSTRUCTIONS   Avoid all smoking, herbs, alcohol, and unprescribed drugs. These chemicals affect the formation and growth of the baby.  Follow your health care provider's instructions regarding medicine use. There are medicines that are either safe or unsafe to take during pregnancy.  Exercise only as directed by your health care provider. Experiencing uterine cramps is a good sign to stop exercising.  Continue to eat regular, healthy meals.  Wear a good support bra for breast tenderness.  Do not use hot tubs, steam rooms, or saunas.  Wear your seat belt at all times when driving.  Avoid raw meat, uncooked cheese, cat litter boxes, and soil used by cats. These carry germs that can cause birth defects in the baby.  Take your prenatal vitamins.  Try taking a stool softener (if your health care provider approves) if you develop constipation. Eat more high-fiber foods, such as fresh vegetables or fruit and whole grains. Drink plenty of fluids to keep your urine clear or pale yellow.  Take warm sitz baths to soothe any pain or discomfort caused by hemorrhoids. Use hemorrhoid cream if your health care provider approves.  If you develop varicose veins, wear support hose. Elevate your feet for 15 minutes, 3-4 times a day. Limit salt in your diet.  Avoid heavy lifting, wear low heel shoes, and practice good posture.  Rest with your legs elevated if you have leg cramps or low back pain.  Visit your dentist if you have not gone yet during your pregnancy. Use a soft toothbrush to brush your teeth and be gentle when you floss.  A sexual relationship may be continued unless your health care provider directs you otherwise.  Continue to go to all your  prenatal visits as directed by your health care provider. SEEK MEDICAL CARE IF:   You have dizziness.  You have mild pelvic cramps, pelvic pressure, or nagging pain in the abdominal area.  You have persistent nausea, vomiting, or diarrhea.  You have a bad smelling vaginal discharge.  You have pain with urination. SEEK IMMEDIATE MEDICAL CARE IF:   You have a fever.  You are leaking fluid from your vagina.  You have spotting or bleeding from your vagina.  You have severe abdominal cramping or pain.  You have rapid weight gain or loss.  You have shortness of breath with chest pain.  You notice sudden or extreme swelling of your face, hands, ankles, feet, or legs.  You have not felt your baby move in over an hour.  You have severe headaches that do not go away with medicine.  You have vision changes.   Document Released: 01/01/2001 Document Revised: 01/12/2013 Document Reviewed: 03/10/2012 [employer] Patient Information 2015 [employer]. This information is not intended to replace advice given to you by your health care provider. Make sure you discuss any questions you have with your health care provider.     

## 2014-09-20 NOTE — Progress Notes (Signed)
Low-risk OB appointment G1P0 [redacted]w[redacted]d Estimated Date of Delivery: 01/08/15 BP 104/58 mmHg  Pulse 76  Wt 171 lb (77.565 kg)  LMP 04/14/2014 (Exact Date)  BP, weight, and urine reviewed.  Refer to obstetrical flow sheet for FH & FHR.  Reports good fm.  Denies regular uc's, lof, vb, or uti s/s. Epigastric pain the other day she thought was a hunger pain- didn't go away when she ate- to try tums next time. Worried about EICF- discussed/reassured.  Reviewed ptl s/s, fm. Plan:  Continue routine obstetrical care  F/U in 4wks for OB appointment, pn2, and f/u u/s to reassess EICF Declined NFP

## 2014-10-10 ENCOUNTER — Telehealth: Payer: Self-pay | Admitting: Women's Health

## 2014-10-10 NOTE — Telephone Encounter (Signed)
Pt c/o migraines off and on through out her pregnancy, concerned blood pressure related. Reviewed pt blood pressures at each PNV all within normal. Pt informed if she has another Migraine during office hours to call our office and we can check B/P at that time. Pt to keep her appt on 10/18/2014 and discuss with Joellyn Haff, CNM at that time.

## 2014-10-18 ENCOUNTER — Other Ambulatory Visit: Payer: Medicaid Other

## 2014-10-18 ENCOUNTER — Encounter: Payer: Medicaid Other | Admitting: Women's Health

## 2014-10-18 ENCOUNTER — Ambulatory Visit (INDEPENDENT_AMBULATORY_CARE_PROVIDER_SITE_OTHER): Payer: Medicaid Other

## 2014-10-18 DIAGNOSIS — O283 Abnormal ultrasonic finding on antenatal screening of mother: Secondary | ICD-10-CM

## 2014-10-18 NOTE — Progress Notes (Signed)
[place] 28+2wks,measurement c/w dates, efw 1274g 58.4%,ant pl gr 1,afi 14.3cm,fhr 126bpm,normal ov's bilat,cx 3.6 cm,LV EICF n/c

## 2014-10-20 ENCOUNTER — Other Ambulatory Visit: Payer: Medicaid Other

## 2014-10-20 DIAGNOSIS — Z131 Encounter for screening for diabetes mellitus: Secondary | ICD-10-CM

## 2014-10-20 DIAGNOSIS — Z369 Encounter for antenatal screening, unspecified: Secondary | ICD-10-CM

## 2014-10-21 LAB — CBC
Hematocrit: 33.5 % — ABNORMAL LOW (ref 34.0–46.6)
Hemoglobin: 11.6 g/dL (ref 11.1–15.9)
MCH: 33 pg (ref 26.6–33.0)
MCHC: 34.6 g/dL (ref 31.5–35.7)
MCV: 95 fL (ref 79–97)
Platelets: 221 10*3/uL (ref 150–379)
RBC: 3.52 x10E6/uL — ABNORMAL LOW (ref 3.77–5.28)
RDW: 12.5 % (ref 12.3–15.4)
WBC: 12.5 10*3/uL — ABNORMAL HIGH (ref 3.4–10.8)

## 2014-10-21 LAB — GLUCOSE TOLERANCE, 2 HOURS W/ 1HR
GLUCOSE, FASTING: 84 mg/dL (ref 65–91)
Glucose, 1 hour: 127 mg/dL (ref 65–179)
Glucose, 2 hour: 105 mg/dL (ref 65–152)

## 2014-10-21 LAB — HIV ANTIBODY (ROUTINE TESTING W REFLEX): HIV Screen 4th Generation wRfx: NONREACTIVE

## 2014-10-21 LAB — ANTIBODY SCREEN: Antibody Screen: NEGATIVE

## 2014-10-21 LAB — RPR: RPR: NONREACTIVE

## 2014-10-24 ENCOUNTER — Ambulatory Visit (INDEPENDENT_AMBULATORY_CARE_PROVIDER_SITE_OTHER): Payer: Medicaid Other | Admitting: Obstetrics & Gynecology

## 2014-10-24 VITALS — BP 102/50 | HR 76 | Wt 175.0 lb

## 2014-10-24 DIAGNOSIS — Z1389 Encounter for screening for other disorder: Secondary | ICD-10-CM

## 2014-10-24 DIAGNOSIS — Z3493 Encounter for supervision of normal pregnancy, unspecified, third trimester: Secondary | ICD-10-CM

## 2014-10-24 DIAGNOSIS — Z331 Pregnant state, incidental: Secondary | ICD-10-CM

## 2014-10-24 LAB — POCT URINALYSIS DIPSTICK
Glucose, UA: NEGATIVE
KETONES UA: NEGATIVE
Nitrite, UA: NEGATIVE
PROTEIN UA: NEGATIVE

## 2014-10-24 NOTE — Progress Notes (Signed)
G1P0 [redacted]w[redacted]d Estimated Date of Delivery: 01/08/15  Blood pressure 102/50, pulse 76, weight 175 lb (79.379 kg), last menstrual period 04/14/2014.   BP weight and urine results all reviewed and noted.  Please refer to the obstetrical flow sheet for the fundal height and fetal heart rate documentation:  Patient reports good fetal movement, denies any bleeding and no rupture of membranes symptoms or regular contractions. Patient is without complaints. All questions were answered.  Orders Placed This Encounter  Procedures  . POCT urinalysis dipstick    Plan:  Continued routine obstetrical care,   Return in about 3 weeks (around 11/14/2014) for LROB.

## 2014-10-31 ENCOUNTER — Encounter: Payer: Self-pay | Admitting: Women's Health

## 2014-10-31 DIAGNOSIS — O283 Abnormal ultrasonic finding on antenatal screening of mother: Secondary | ICD-10-CM | POA: Insufficient documentation

## 2014-11-15 ENCOUNTER — Ambulatory Visit (INDEPENDENT_AMBULATORY_CARE_PROVIDER_SITE_OTHER): Payer: Medicaid Other | Admitting: Women's Health

## 2014-11-15 ENCOUNTER — Other Ambulatory Visit: Payer: Self-pay | Admitting: Women's Health

## 2014-11-15 VITALS — BP 110/60 | HR 80 | Wt 179.0 lb

## 2014-11-15 DIAGNOSIS — Z331 Pregnant state, incidental: Secondary | ICD-10-CM

## 2014-11-15 DIAGNOSIS — Z3403 Encounter for supervision of normal first pregnancy, third trimester: Secondary | ICD-10-CM

## 2014-11-15 DIAGNOSIS — N9489 Other specified conditions associated with female genital organs and menstrual cycle: Secondary | ICD-10-CM | POA: Diagnosis not present

## 2014-11-15 DIAGNOSIS — Z1389 Encounter for screening for other disorder: Secondary | ICD-10-CM

## 2014-11-15 LAB — POCT WET PREP (WET MOUNT): Clue Cells Wet Prep Whiff POC: NEGATIVE

## 2014-11-15 LAB — POCT URINALYSIS DIPSTICK
Blood, UA: NEGATIVE
Glucose, UA: NEGATIVE
Ketones, UA: NEGATIVE
NITRITE UA: NEGATIVE
PROTEIN UA: NEGATIVE

## 2014-11-15 NOTE — Patient Instructions (Addendum)
Call the office ([phone]) or go to [facility] if:  You begin to have strong, frequent contractions  Your water breaks.  Sometimes it is a big gush of fluid, sometimes it is just a trickle that keeps getting your panties wet or running down your legs  You have vaginal bleeding.  It is normal to have a small amount of spotting if your cervix was checked.   You don't feel your baby moving like normal.  If you don't, get you something to eat and drink and lay down and focus on feeling your baby move.  You should feel at least 10 movements in 2 hours.  If you don't, you should call the office or go to [facility].    For your lower back pain you may:  Purchase a pregnancy belt from [employer], [employer], [employer], etc and wear it while you are up and about  Take warm baths  Use a heating pad to your lower back for no longer than 20 minutes at a time, and do not place near abdomen  Take tylenol as needed. Please follow directions on the bottle   Preterm Labor Information Preterm labor is when labor starts at less than 37 weeks of pregnancy. The normal length of a pregnancy is 39 to 41 weeks. CAUSES Often, there is no identifiable underlying cause as to why a woman goes into preterm labor. One of the most common known causes of preterm labor is infection. Infections of the uterus, cervix, vagina, amniotic sac, bladder, kidney, or even the lungs (pneumonia) can cause labor to start. Other suspected causes of preterm labor include:   Urogenital infections, such as yeast infections and bacterial vaginosis.   Uterine abnormalities (uterine shape, uterine septum, fibroids, or bleeding from the placenta).   A cervix that has been operated on (it may fail to stay closed).   Malformations in the fetus.   Multiple gestations (twins, triplets, and so on).   Breakage of the amniotic sac.  RISK FACTORS  Having a previous history of preterm labor.   Having premature  rupture of membranes (PROM).   Having a placenta that covers the opening of the cervix (placenta previa).   Having a placenta that separates from the uterus (placental abruption).   Having a cervix that is too weak to hold the fetus in the uterus (incompetent cervix).   Having too much fluid in the amniotic sac (polyhydramnios).   Taking illegal drugs or smoking while pregnant.   Not gaining enough weight while pregnant.   Being younger than 18 and older than 24 years old.   Having a low socioeconomic status.   Being African American. SYMPTOMS Signs and symptoms of preterm labor include:   Menstrual-like cramps, abdominal pain, or back pain.  Uterine contractions that are regular, as frequent as six in an hour, regardless of their intensity (may be mild or painful).  Contractions that start on the top of the uterus and spread down to the lower abdomen and back.   A sense of increased pelvic pressure.   A watery or bloody mucus discharge that comes from the vagina.  TREATMENT Depending on the length of the pregnancy and other circumstances, your health care provider may suggest bed rest. If necessary, there are medicines that can be given to stop contractions and to mature the fetal lungs. If labor happens before 34 weeks of pregnancy, a prolonged hospital stay may be recommended. Treatment depends on the condition of both you and   the fetus.  WHAT SHOULD YOU DO IF YOU THINK YOU ARE IN PRETERM LABOR? Call your health care provider right away. You will need to go to the hospital to get checked immediately. HOW CAN YOU PREVENT PRETERM LABOR IN FUTURE PREGNANCIES? You should:   Stop smoking if you smoke.  Maintain healthy weight gain and avoid chemicals and drugs that are not necessary.  Be watchful for any type of infection.  Inform your health care provider if you have a known history of preterm labor.   This information is not intended to replace advice given  to you by your health care provider. Make sure you discuss any questions you have with your health care provider.   Document Released: 03/30/2003 Document Revised: 09/09/2012 Document Reviewed: 02/10/2012 Elsevier Interactive Patient Education 2016 [employer]  

## 2014-11-15 NOTE — Progress Notes (Signed)
Low-risk OB appointment G1P0 59108w2d Estimated Date of Delivery: 01/08/15 BP 110/60 mmHg  Pulse 80  Wt 179 lb (81.194 kg)  LMP 04/14/2014 (Exact Date)  BP, weight, and urine reviewed.  Refer to obstetrical flow sheet for FH & FHR.  Reports good fm.  Denies regular uc's, lof, vb, or uti s/s. Vulvar burning- spec exam cx visually closed, small amount thin white nonodorous d/c- wet prep many wbc's otherwise neg- will send gc/ct Reviewed ptl s/s, fkc, pn2 results. Plan:  Continue routine obstetrical care  F/U in 2wks for OB appointment

## 2014-11-16 LAB — GC/CHLAMYDIA PROBE AMP
Chlamydia trachomatis, NAA: NEGATIVE
Neisseria gonorrhoeae by PCR: NEGATIVE

## 2014-11-29 ENCOUNTER — Encounter: Payer: Self-pay | Admitting: Women's Health

## 2014-11-29 ENCOUNTER — Ambulatory Visit (INDEPENDENT_AMBULATORY_CARE_PROVIDER_SITE_OTHER): Payer: Medicaid Other | Admitting: Women's Health

## 2014-11-29 VITALS — BP 112/58 | HR 80 | Wt 182.0 lb

## 2014-11-29 DIAGNOSIS — N898 Other specified noninflammatory disorders of vagina: Secondary | ICD-10-CM | POA: Diagnosis not present

## 2014-11-29 DIAGNOSIS — Z1389 Encounter for screening for other disorder: Secondary | ICD-10-CM

## 2014-11-29 DIAGNOSIS — Z331 Pregnant state, incidental: Secondary | ICD-10-CM

## 2014-11-29 DIAGNOSIS — O26893 Other specified pregnancy related conditions, third trimester: Secondary | ICD-10-CM

## 2014-11-29 DIAGNOSIS — Z3403 Encounter for supervision of normal first pregnancy, third trimester: Secondary | ICD-10-CM

## 2014-11-29 LAB — POCT WET PREP (WET MOUNT): CLUE CELLS WET PREP WHIFF POC: NEGATIVE

## 2014-11-29 LAB — POCT URINALYSIS DIPSTICK
GLUCOSE UA: NEGATIVE
Ketones, UA: NEGATIVE
Leukocytes, UA: NEGATIVE
NITRITE UA: NEGATIVE
Protein, UA: NEGATIVE
RBC UA: NEGATIVE

## 2014-11-29 NOTE — Progress Notes (Signed)
Low-risk OB appointment G1P0 6911w2d Estimated Date of Delivery: 01/08/15 BP 112/58 mmHg  Pulse 80  Wt 182 lb (82.555 kg)  LMP 04/14/2014 (Exact Date)  BP, weight, and urine reviewed.  Refer to obstetrical flow sheet for FH & FHR.  Reports good fm.  Denies regular uc's, lof, vb, or uti s/s. Still having some vulvar irritation- had wet prep last visit that had wbc's gc/ct neg. Showers daily, uses baby wipes to wash vulvar area.  Spec exam: cx visually closed, mod amount creamy white nonodorous d/c c/w physiological d/c, wet prep neg- to stop using baby wipes, don't wash excessively, wear panty liner to wick d/c Reviewed ptl s/s, fkc. Plan:  Continue routine obstetrical care  F/U in 2wks for OB appointment

## 2014-11-29 NOTE — Patient Instructions (Signed)
Call the office ([phone]) or go to [facility] if:  You begin to have strong, frequent contractions  Your water breaks.  Sometimes it is a big gush of fluid, sometimes it is just a trickle that keeps getting your panties wet or running down your legs  You have vaginal bleeding.  It is normal to have a small amount of spotting if your cervix was checked.   You don't feel your baby moving like normal.  If you don't, get you something to eat and drink and lay down and focus on feeling your baby move.  You should feel at least 10 movements in 2 hours.  If you don't, you should call the office or go to [facility].    Preterm Labor Information Preterm labor is when labor starts at less than 37 weeks of pregnancy. The normal length of a pregnancy is 39 to 41 weeks. CAUSES Often, there is no identifiable underlying cause as to why a woman goes into preterm labor. One of the most common known causes of preterm labor is infection. Infections of the uterus, cervix, vagina, amniotic sac, bladder, kidney, or even the lungs (pneumonia) can cause labor to start. Other suspected causes of preterm labor include:   Urogenital infections, such as yeast infections and bacterial vaginosis.   Uterine abnormalities (uterine shape, uterine septum, fibroids, or bleeding from the placenta).   A cervix that has been operated on (it may fail to stay closed).   Malformations in the fetus.   Multiple gestations (twins, triplets, and so on).   Breakage of the amniotic sac.  RISK FACTORS  Having a previous history of preterm labor.   Having premature rupture of membranes (PROM).   Having a placenta that covers the opening of the cervix (placenta previa).   Having a placenta that separates from the uterus (placental abruption).   Having a cervix that is too weak to hold the fetus in the uterus (incompetent cervix).   Having too much fluid in the amniotic sac (polyhydramnios).   Taking  illegal drugs or smoking while pregnant.   Not gaining enough weight while pregnant.   Being younger than 18 and older than 24 years old.   Having a low socioeconomic status.   Being African American. SYMPTOMS Signs and symptoms of preterm labor include:   Menstrual-like cramps, abdominal pain, or back pain.  Uterine contractions that are regular, as frequent as six in an hour, regardless of their intensity (may be mild or painful).  Contractions that start on the top of the uterus and spread down to the lower abdomen and back.   A sense of increased pelvic pressure.   A watery or bloody mucus discharge that comes from the vagina.  TREATMENT Depending on the length of the pregnancy and other circumstances, your health care provider may suggest bed rest. If necessary, there are medicines that can be given to stop contractions and to mature the fetal lungs. If labor happens before 34 weeks of pregnancy, a prolonged hospital stay may be recommended. Treatment depends on the condition of both you and the fetus.  WHAT SHOULD YOU DO IF YOU THINK YOU ARE IN PRETERM LABOR? Call your health care provider right away. You will need to go to the hospital to get checked immediately. HOW CAN YOU PREVENT PRETERM LABOR IN FUTURE PREGNANCIES? You should:   Stop smoking if you smoke.  Maintain healthy weight gain and avoid chemicals and drugs that are not necessary.  Be watchful for   any type of infection.  Inform your health care provider if you have a known history of preterm labor.   This information is not intended to replace advice given to you by your health care provider. Make sure you discuss any questions you have with your health care provider.   Document Released: 03/30/2003 Document Revised: 09/09/2012 Document Reviewed: 02/10/2012 Elsevier Interactive Patient Education 2016 [employer]  

## 2014-12-12 ENCOUNTER — Ambulatory Visit (INDEPENDENT_AMBULATORY_CARE_PROVIDER_SITE_OTHER): Payer: Medicaid Other | Admitting: Obstetrics and Gynecology

## 2014-12-12 ENCOUNTER — Encounter: Payer: Self-pay | Admitting: Obstetrics and Gynecology

## 2014-12-12 VITALS — BP 90/52 | HR 84 | Wt 182.0 lb

## 2014-12-12 DIAGNOSIS — Z331 Pregnant state, incidental: Secondary | ICD-10-CM

## 2014-12-12 DIAGNOSIS — Z1389 Encounter for screening for other disorder: Secondary | ICD-10-CM

## 2014-12-12 DIAGNOSIS — Z3403 Encounter for supervision of normal first pregnancy, third trimester: Secondary | ICD-10-CM

## 2014-12-12 LAB — POCT URINALYSIS DIPSTICK
Blood, UA: NEGATIVE
Glucose, UA: NEGATIVE
Ketones, UA: NEGATIVE
LEUKOCYTES UA: NEGATIVE
Nitrite, UA: NEGATIVE
PROTEIN UA: NEGATIVE

## 2014-12-12 NOTE — Patient Instructions (Signed)
Fetal Movement Counts  Patient Name: __________________________________________________ Patient Due Date: ____________________  Performing a fetal movement count is highly recommended in high-risk pregnancies, but it is good for every pregnant woman to do. Your health care provider may ask you to start counting fetal movements at 28 weeks of the pregnancy. Fetal movements often increase:  · After eating a full meal.  · After physical activity.  · After eating or drinking something sweet or cold.  · At rest.  Pay attention to when you feel the baby is most active. This will help you notice a pattern of your baby's sleep and wake cycles and what factors contribute to an increase in fetal movement. It is important to perform a fetal movement count at the same time each day when your baby is normally most active.   HOW TO COUNT FETAL MOVEMENTS  1. Find a quiet and comfortable area to sit or lie down on your left side. Lying on your left side provides the best blood and oxygen circulation to your baby.  2. Write down the day and time on a sheet of paper or in a journal.  3. Start counting kicks, flutters, swishes, rolls, or jabs in a 2-hour period. You should feel at least 10 movements within 2 hours.  4. If you do not feel 10 movements in 2 hours, wait 2-3 hours and count again. Look for a change in the pattern or not enough counts in 2 hours.  SEEK MEDICAL CARE IF:  · You feel less than 10 counts in 2 hours, tried twice.  · There is no movement in over an hour.  · The pattern is changing or taking longer each day to reach 10 counts in 2 hours.  · You feel the baby is not moving as he or she usually does.  Date: ____________ Movements: ____________ Start time: ____________ Finish time: ____________   Date: ____________ Movements: ____________ Start time: ____________ Finish time: ____________  Date: ____________ Movements: ____________ Start time: ____________ Finish time: ____________  Date: ____________ Movements:  ____________ Start time: ____________ Finish time: ____________  Date: ____________ Movements: ____________ Start time: ____________ Finish time: ____________  Date: ____________ Movements: ____________ Start time: ____________ Finish time: ____________  Date: ____________ Movements: ____________ Start time: ____________ Finish time: ____________  Date: ____________ Movements: ____________ Start time: ____________ Finish time: ____________   Date: ____________ Movements: ____________ Start time: ____________ Finish time: ____________  Date: ____________ Movements: ____________ Start time: ____________ Finish time: ____________  Date: ____________ Movements: ____________ Start time: ____________ Finish time: ____________  Date: ____________ Movements: ____________ Start time: ____________ Finish time: ____________  Date: ____________ Movements: ____________ Start time: ____________ Finish time: ____________  Date: ____________ Movements: ____________ Start time: ____________ Finish time: ____________  Date: ____________ Movements: ____________ Start time: ____________ Finish time: ____________   Date: ____________ Movements: ____________ Start time: ____________ Finish time: ____________  Date: ____________ Movements: ____________ Start time: ____________ Finish time: ____________  Date: ____________ Movements: ____________ Start time: ____________ Finish time: ____________  Date: ____________ Movements: ____________ Start time: ____________ Finish time: ____________  Date: ____________ Movements: ____________ Start time: ____________ Finish time: ____________  Date: ____________ Movements: ____________ Start time: ____________ Finish time: ____________  Date: ____________ Movements: ____________ Start time: ____________ Finish time: ____________   Date: ____________ Movements: ____________ Start time: ____________ Finish time: ____________  Date: ____________ Movements: ____________ Start time: ____________ Finish  time: ____________  Date: ____________ Movements: ____________ Start time: ____________ Finish time: ____________  Date: ____________ Movements: ____________ Start time:   ____________ Finish time: ____________  Date: ____________ Movements: ____________ Start time: ____________ Finish time: ____________  Date: ____________ Movements: ____________ Start time: ____________ Finish time: ____________  Date: ____________ Movements: ____________ Start time: ____________ Finish time: ____________   Date: ____________ Movements: ____________ Start time: ____________ Finish time: ____________  Date: ____________ Movements: ____________ Start time: ____________ Finish time: ____________  Date: ____________ Movements: ____________ Start time: ____________ Finish time: ____________  Date: ____________ Movements: ____________ Start time: ____________ Finish time: ____________  Date: ____________ Movements: ____________ Start time: ____________ Finish time: ____________  Date: ____________ Movements: ____________ Start time: ____________ Finish time: ____________  Date: ____________ Movements: ____________ Start time: ____________ Finish time: ____________   Date: ____________ Movements: ____________ Start time: ____________ Finish time: ____________  Date: ____________ Movements: ____________ Start time: ____________ Finish time: ____________  Date: ____________ Movements: ____________ Start time: ____________ Finish time: ____________  Date: ____________ Movements: ____________ Start time: ____________ Finish time: ____________  Date: ____________ Movements: ____________ Start time: ____________ Finish time: ____________  Date: ____________ Movements: ____________ Start time: ____________ Finish time: ____________  Date: ____________ Movements: ____________ Start time: ____________ Finish time: ____________   Date: ____________ Movements: ____________ Start time: ____________ Finish time: ____________  Date: ____________  Movements: ____________ Start time: ____________ Finish time: ____________  Date: ____________ Movements: ____________ Start time: ____________ Finish time: ____________  Date: ____________ Movements: ____________ Start time: ____________ Finish time: ____________  Date: ____________ Movements: ____________ Start time: ____________ Finish time: ____________  Date: ____________ Movements: ____________ Start time: ____________ Finish time: ____________  Date: ____________ Movements: ____________ Start time: ____________ Finish time: ____________   Date: ____________ Movements: ____________ Start time: ____________ Finish time: ____________  Date: ____________ Movements: ____________ Start time: ____________ Finish time: ____________  Date: ____________ Movements: ____________ Start time: ____________ Finish time: ____________  Date: ____________ Movements: ____________ Start time: ____________ Finish time: ____________  Date: ____________ Movements: ____________ Start time: ____________ Finish time: ____________  Date: ____________ Movements: ____________ Start time: ____________ Finish time: ____________     This information is not intended to replace advice given to you by your health care provider. Make sure you discuss any questions you have with your health care provider.     Document Released: 02/06/2006 Document Revised: 01/28/2014 Document Reviewed: 11/04/2011  Elsevier Interactive Patient Education ©2016 [employer]

## 2014-12-12 NOTE — Progress Notes (Signed)
Patient ID: Lisa Christian, female   DOB: 02-02-1990, 24 y.o.   MRN: [id number] G1P0 6062w1d Estimated Date of Delivery: 01/08/15  Blood pressure 90/52, pulse 84, weight 182 lb (82.555 kg), last menstrual period 04/14/2014.   refer to the ob flow sheet for FH and FHR, also BP, Wt, Urine results:negative  Patient reports while she continues to have fetal movement the fetal movement have diminished. Pt denies any bleeding and no rupture of membranes symptoms or regular contractions. Patient complaints: No complaints today. Pt confirms mild vaginal discharge.  FHR: 145 FH: 35cm Questions were answered. Assessment:  1. LORB 1762w1d 2  Poor support from FOB  Plan:   1. Continued routine obstetrical care 2. Fetal kick count test  3. F/u in 1 week for routine obstetrical care, childbirth classes or tour of hosp encouraged.   By signing my name below, I, Marica Otterusrat Rahman, attest that this documentation has been prepared under the direction and in the presence of Christin BachJohn Eldena Dede, MD. Electronically Signed: Marica OtterNusrat Rahman, ED Scribe. 12/12/2014. 12:08 PM  I personally performed the services described in this documentation, which was SCRIBED in my presence. The recorded information has been reviewed and considered accurate. It has been edited as necessary during review. Tilda BurrowFERGUSON,Tanishka Drolet V, MD

## 2014-12-12 NOTE — Progress Notes (Signed)
Pt denies any problems or concerns at this time.  

## 2014-12-19 ENCOUNTER — Encounter: Payer: Self-pay | Admitting: Women's Health

## 2014-12-19 ENCOUNTER — Ambulatory Visit (INDEPENDENT_AMBULATORY_CARE_PROVIDER_SITE_OTHER): Payer: Medicaid Other | Admitting: Women's Health

## 2014-12-19 VITALS — BP 110/62 | HR 80 | Wt 187.0 lb

## 2014-12-19 DIAGNOSIS — Z1389 Encounter for screening for other disorder: Secondary | ICD-10-CM

## 2014-12-19 DIAGNOSIS — Z331 Pregnant state, incidental: Secondary | ICD-10-CM

## 2014-12-19 DIAGNOSIS — Z369 Encounter for antenatal screening, unspecified: Secondary | ICD-10-CM

## 2014-12-19 DIAGNOSIS — Z3403 Encounter for supervision of normal first pregnancy, third trimester: Secondary | ICD-10-CM

## 2014-12-19 LAB — POCT URINALYSIS DIPSTICK
GLUCOSE UA: NEGATIVE
KETONES UA: NEGATIVE
Leukocytes, UA: NEGATIVE
NITRITE UA: NEGATIVE
Protein, UA: NEGATIVE
RBC UA: NEGATIVE

## 2014-12-19 LAB — OB RESULTS CONSOLE GBS: GBS: NEGATIVE

## 2014-12-19 MED ORDER — VALACYCLOVIR HCL 1 G PO TABS: 2000.0000 mg | ORAL_TABLET | Freq: Two times a day (BID) | ORAL | Status: DC

## 2014-12-19 NOTE — Progress Notes (Signed)
Low-risk OB appointment G1P0 2356w1d Estimated Date of Delivery: 01/08/15 BP 110/62 mmHg  Pulse 80  Wt 187 lb (84.823 kg)  LMP 04/14/2014 (Exact Date)  BP, weight, and urine reviewed.  Refer to obstetrical flow sheet for FH & FHR.  Reports good fm.  Denies regular uc's, lof, vb, or uti s/s. Fever blister x 2d, rx valtrex 2gm bid x 1d- discussed not kissing baby/washing hands, etc if has another after baby born Very anxious, many questions GBS collected SVE per request: 0/th/-2, vtx, posterior Reviewed labor s/s, fkc. Plan:  Continue routine obstetrical care  F/U in 1wk for OB appointment

## 2014-12-19 NOTE — Patient Instructions (Signed)
Call the office ([phone]) or go to [facility] if:  You begin to have strong, frequent contractions  Your water breaks.  Sometimes it is a big gush of fluid, sometimes it is just a trickle that keeps getting your panties wet or running down your legs  You have vaginal bleeding.  It is normal to have a small amount of spotting if your cervix was checked.   You don't feel your baby moving like normal.  If you don't, get you something to eat and drink and lay down and focus on feeling your baby move.  You should feel at least 10 movements in 2 hours.  If you don't, you should call the office or go to [facility].    Braxton Hicks Contractions Contractions of the uterus can occur throughout pregnancy. Contractions are not always a sign that you are in labor.  WHAT ARE BRAXTON HICKS CONTRACTIONS?  Contractions that occur before labor are called Braxton Hicks contractions, or false labor. Toward the end of pregnancy (32-34 weeks), these contractions can develop more often and may become more forceful. This is not true labor because these contractions do not result in opening (dilatation) and thinning of the cervix. They are sometimes difficult to tell apart from true labor because these contractions can be forceful and people have different pain tolerances. You should not feel embarrassed if you go to the hospital with false labor. Sometimes, the only way to tell if you are in true labor is for your health care provider to look for changes in the cervix. If there are no prenatal problems or other health problems associated with the pregnancy, it is completely safe to be sent home with false labor and await the onset of true labor. HOW CAN YOU TELL THE DIFFERENCE BETWEEN TRUE AND FALSE LABOR? False Labor  The contractions of false labor are usually shorter and not as hard as those of true labor.   The contractions are usually irregular.   The contractions are often felt in the front of  the lower abdomen and in the groin.   The contractions may go away when you walk around or change positions while lying down.   The contractions get weaker and are shorter lasting as time goes on.   The contractions do not usually become progressively stronger, regular, and closer together as with true labor.  True Labor  Contractions in true labor last 30-70 seconds, become very regular, usually become more intense, and increase in frequency.   The contractions do not go away with walking.   The discomfort is usually felt in the top of the uterus and spreads to the lower abdomen and low back.   True labor can be determined by your health care provider with an exam. This will show that the cervix is dilating and getting thinner.  WHAT TO REMEMBER  Keep up with your usual exercises and follow other instructions given by your health care provider.   Take medicines as directed by your health care provider.   Keep your regular prenatal appointments.   Eat and drink lightly if you think you are going into labor.   If Braxton Hicks contractions are making you uncomfortable:   Change your position from lying down or resting to walking, or from walking to resting.   Sit and rest in a tub of warm water.   Drink 2-3 glasses of water. Dehydration may cause these contractions.   Do slow and deep breathing several times an hour.    WHEN SHOULD I SEEK IMMEDIATE MEDICAL CARE? Seek immediate medical care if:  Your contractions become stronger, more regular, and closer together.   You have fluid leaking or gushing from your vagina.   You have a fever.   You pass blood-tinged mucus.   You have vaginal bleeding.   You have continuous abdominal pain.   You have low back pain that you never had before.   You feel your baby's head pushing down and causing pelvic pressure.   Your baby is not moving as much as it used to.    This information is not intended to  replace advice given to you by your health care provider. Make sure you discuss any questions you have with your health care provider.   Document Released: 01/07/2005 Document Revised: 01/12/2013 Document Reviewed: 10/19/2012 Elsevier Interactive Patient Education 2016 [employer] Inc.  

## 2014-12-20 LAB — GC/CHLAMYDIA PROBE AMP
CHLAMYDIA, DNA PROBE: NEGATIVE
Neisseria gonorrhoeae by PCR: NEGATIVE

## 2014-12-23 LAB — CULTURE, BETA STREP (GROUP B ONLY): Strep Gp B Culture: NEGATIVE

## 2014-12-26 ENCOUNTER — Encounter: Payer: Medicaid Other | Admitting: Women's Health

## 2014-12-26 ENCOUNTER — Encounter: Payer: Self-pay | Admitting: Women's Health

## 2014-12-27 ENCOUNTER — Encounter: Payer: Self-pay | Admitting: Women's Health

## 2014-12-27 ENCOUNTER — Ambulatory Visit (INDEPENDENT_AMBULATORY_CARE_PROVIDER_SITE_OTHER): Payer: Medicaid Other | Admitting: Women's Health

## 2014-12-27 VITALS — BP 104/60 | HR 88 | Wt 187.0 lb

## 2014-12-27 DIAGNOSIS — Z3403 Encounter for supervision of normal first pregnancy, third trimester: Secondary | ICD-10-CM

## 2014-12-27 DIAGNOSIS — Z331 Pregnant state, incidental: Secondary | ICD-10-CM

## 2014-12-27 DIAGNOSIS — Z3A38 38 weeks gestation of pregnancy: Secondary | ICD-10-CM

## 2014-12-27 DIAGNOSIS — Z1389 Encounter for screening for other disorder: Secondary | ICD-10-CM

## 2014-12-27 LAB — POCT URINALYSIS DIPSTICK
Blood, UA: NEGATIVE
GLUCOSE UA: NEGATIVE
Ketones, UA: NEGATIVE
NITRITE UA: NEGATIVE

## 2014-12-27 NOTE — Patient Instructions (Signed)
Call the office ([phone]) or go to [facility] if:  You begin to have strong, frequent contractions  Your water breaks.  Sometimes it is a big gush of fluid, sometimes it is just a trickle that keeps getting your panties wet or running down your legs  You have vaginal bleeding.  It is normal to have a small amount of spotting if your cervix was checked.   You don't feel your baby moving like normal.  If you don't, get you something to eat and drink and lay down and focus on feeling your baby move.  You should feel at least 10 movements in 2 hours.  If you don't, you should call the office or go to [facility].    Braxton Hicks Contractions Contractions of the uterus can occur throughout pregnancy. Contractions are not always a sign that you are in labor.  WHAT ARE BRAXTON HICKS CONTRACTIONS?  Contractions that occur before labor are called Braxton Hicks contractions, or false labor. Toward the end of pregnancy (32-34 weeks), these contractions can develop more often and may become more forceful. This is not true labor because these contractions do not result in opening (dilatation) and thinning of the cervix. They are sometimes difficult to tell apart from true labor because these contractions can be forceful and people have different pain tolerances. You should not feel embarrassed if you go to the hospital with false labor. Sometimes, the only way to tell if you are in true labor is for your health care provider to look for changes in the cervix. If there are no prenatal problems or other health problems associated with the pregnancy, it is completely safe to be sent home with false labor and await the onset of true labor. HOW CAN YOU TELL THE DIFFERENCE BETWEEN TRUE AND FALSE LABOR? False Labor  The contractions of false labor are usually shorter and not as hard as those of true labor.   The contractions are usually irregular.   The contractions are often felt in the front of  the lower abdomen and in the groin.   The contractions may go away when you walk around or change positions while lying down.   The contractions get weaker and are shorter lasting as time goes on.   The contractions do not usually become progressively stronger, regular, and closer together as with true labor.  True Labor  Contractions in true labor last 30-70 seconds, become very regular, usually become more intense, and increase in frequency.   The contractions do not go away with walking.   The discomfort is usually felt in the top of the uterus and spreads to the lower abdomen and low back.   True labor can be determined by your health care provider with an exam. This will show that the cervix is dilating and getting thinner.  WHAT TO REMEMBER  Keep up with your usual exercises and follow other instructions given by your health care provider.   Take medicines as directed by your health care provider.   Keep your regular prenatal appointments.   Eat and drink lightly if you think you are going into labor.   If Braxton Hicks contractions are making you uncomfortable:   Change your position from lying down or resting to walking, or from walking to resting.   Sit and rest in a tub of warm water.   Drink 2-3 glasses of water. Dehydration may cause these contractions.   Do slow and deep breathing several times an hour.    WHEN SHOULD I SEEK IMMEDIATE MEDICAL CARE? Seek immediate medical care if:  Your contractions become stronger, more regular, and closer together.   You have fluid leaking or gushing from your vagina.   You have a fever.   You pass blood-tinged mucus.   You have vaginal bleeding.   You have continuous abdominal pain.   You have low back pain that you never had before.   You feel your baby's head pushing down and causing pelvic pressure.   Your baby is not moving as much as it used to.    This information is not intended to  replace advice given to you by your health care provider. Make sure you discuss any questions you have with your health care provider.   Document Released: 01/07/2005 Document Revised: 01/12/2013 Document Reviewed: 10/19/2012 Elsevier Interactive Patient Education 2016 [employer] Inc.  

## 2014-12-27 NOTE — Progress Notes (Signed)
Low-risk OB appointment G1P0 2124w2d Estimated Date of Delivery: 01/08/15 BP 104/60 mmHg  Pulse 88  Wt 187 lb (84.823 kg)  LMP 04/14/2014 (Exact Date)  BP, weight, and urine reviewed.  Refer to obstetrical flow sheet for FH & FHR.  Reports good fm.  Denies regular uc's, lof, vb, or uti s/s. No complaints. Reviewed labor s/s, fkc, gbs neg. Plan:  Continue routine obstetrical care  F/U in 1wk for OB appointment

## 2015-01-03 ENCOUNTER — Encounter: Payer: Self-pay | Admitting: Women's Health

## 2015-01-03 ENCOUNTER — Ambulatory Visit (INDEPENDENT_AMBULATORY_CARE_PROVIDER_SITE_OTHER): Payer: Medicaid Other | Admitting: Women's Health

## 2015-01-03 VITALS — BP 120/60 | HR 84 | Wt 186.0 lb

## 2015-01-03 DIAGNOSIS — Z3403 Encounter for supervision of normal first pregnancy, third trimester: Secondary | ICD-10-CM

## 2015-01-03 DIAGNOSIS — Z1389 Encounter for screening for other disorder: Secondary | ICD-10-CM

## 2015-01-03 DIAGNOSIS — Z331 Pregnant state, incidental: Secondary | ICD-10-CM

## 2015-01-03 DIAGNOSIS — Z3A39 39 weeks gestation of pregnancy: Secondary | ICD-10-CM

## 2015-01-03 LAB — POCT URINALYSIS DIPSTICK
Glucose, UA: NEGATIVE
Nitrite, UA: NEGATIVE
RBC UA: NEGATIVE

## 2015-01-03 NOTE — Progress Notes (Signed)
Low-risk OB appointment G1P0 7845w2d Estimated Date of Delivery: 01/08/15 BP 120/60 mmHg  Pulse 84  Wt 186 lb (84.369 kg)  LMP 04/14/2014 (Exact Date)  BP, weight, and urine reviewed.  Refer to obstetrical flow sheet for FH & FHR.  Reports good fm.  Denies regular uc's, lof, vb, or uti s/s. No complaints. Many questions, very anxious.  DeclinesSVE Reviewed labor s/s, fkc. Plan:  Continue routine obstetrical care  F/U in 1wk for OB appointment

## 2015-01-03 NOTE — Patient Instructions (Signed)
Call the office ([phone]) or go to [facility] if:  You begin to have strong, frequent contractions  Your water breaks.  Sometimes it is a big gush of fluid, sometimes it is just a trickle that keeps getting your panties wet or running down your legs  You have vaginal bleeding.  It is normal to have a small amount of spotting if your cervix was checked.   You don't feel your baby moving like normal.  If you don't, get you something to eat and drink and lay down and focus on feeling your baby move.  You should feel at least 10 movements in 2 hours.  If you don't, you should call the office or go to [facility].    Braxton Hicks Contractions Contractions of the uterus can occur throughout pregnancy. Contractions are not always a sign that you are in labor.  WHAT ARE BRAXTON HICKS CONTRACTIONS?  Contractions that occur before labor are called Braxton Hicks contractions, or false labor. Toward the end of pregnancy (32-34 weeks), these contractions can develop more often and may become more forceful. This is not true labor because these contractions do not result in opening (dilatation) and thinning of the cervix. They are sometimes difficult to tell apart from true labor because these contractions can be forceful and people have different pain tolerances. You should not feel embarrassed if you go to the hospital with false labor. Sometimes, the only way to tell if you are in true labor is for your health care provider to look for changes in the cervix. If there are no prenatal problems or other health problems associated with the pregnancy, it is completely safe to be sent home with false labor and await the onset of true labor. HOW CAN YOU TELL THE DIFFERENCE BETWEEN TRUE AND FALSE LABOR? False Labor  The contractions of false labor are usually shorter and not as hard as those of true labor.   The contractions are usually irregular.   The contractions are often felt in the front of  the lower abdomen and in the groin.   The contractions may go away when you walk around or change positions while lying down.   The contractions get weaker and are shorter lasting as time goes on.   The contractions do not usually become progressively stronger, regular, and closer together as with true labor.  True Labor  Contractions in true labor last 30-70 seconds, become very regular, usually become more intense, and increase in frequency.   The contractions do not go away with walking.   The discomfort is usually felt in the top of the uterus and spreads to the lower abdomen and low back.   True labor can be determined by your health care provider with an exam. This will show that the cervix is dilating and getting thinner.  WHAT TO REMEMBER  Keep up with your usual exercises and follow other instructions given by your health care provider.   Take medicines as directed by your health care provider.   Keep your regular prenatal appointments.   Eat and drink lightly if you think you are going into labor.   If Braxton Hicks contractions are making you uncomfortable:   Change your position from lying down or resting to walking, or from walking to resting.   Sit and rest in a tub of warm water.   Drink 2-3 glasses of water. Dehydration may cause these contractions.   Do slow and deep breathing several times an hour.    WHEN SHOULD I SEEK IMMEDIATE MEDICAL CARE? Seek immediate medical care if:  Your contractions become stronger, more regular, and closer together.   You have fluid leaking or gushing from your vagina.   You have a fever.   You pass blood-tinged mucus.   You have vaginal bleeding.   You have continuous abdominal pain.   You have low back pain that you never had before.   You feel your baby's head pushing down and causing pelvic pressure.   Your baby is not moving as much as it used to.    This information is not intended to  replace advice given to you by your health care provider. Make sure you discuss any questions you have with your health care provider.   Document Released: 01/07/2005 Document Revised: 01/12/2013 Document Reviewed: 10/19/2012 Elsevier Interactive Patient Education 2016 [employer] Inc.  

## 2015-01-07 ENCOUNTER — Inpatient Hospital Stay (HOSPITAL_COMMUNITY)
Admission: AD | Admit: 2015-01-07 | Discharge: 2015-01-07 | Disposition: A | Discharge status: Home / Self Care | Payer: Medicaid Other | Source: Ambulatory Visit | Attending: Obstetrics & Gynecology | Admitting: Obstetrics & Gynecology

## 2015-01-07 ENCOUNTER — Encounter (HOSPITAL_COMMUNITY): Payer: Self-pay | Admitting: *Deleted

## 2015-01-07 DIAGNOSIS — O212 Late vomiting of pregnancy: Secondary | ICD-10-CM | POA: Diagnosis not present

## 2015-01-07 DIAGNOSIS — O219 Vomiting of pregnancy, unspecified: Secondary | ICD-10-CM

## 2015-01-07 DIAGNOSIS — R112 Nausea with vomiting, unspecified: Secondary | ICD-10-CM

## 2015-01-07 DIAGNOSIS — Z3A39 39 weeks gestation of pregnancy: Secondary | ICD-10-CM | POA: Insufficient documentation

## 2015-01-07 DIAGNOSIS — R197 Diarrhea, unspecified: Secondary | ICD-10-CM

## 2015-01-07 HISTORY — DX: Family history of other specified conditions: Z84.89

## 2015-01-07 HISTORY — DX: Panic disorder (episodic paroxysmal anxiety): F41.0

## 2015-01-07 LAB — URINALYSIS, ROUTINE W REFLEX MICROSCOPIC
Glucose, UA: NEGATIVE mg/dL
Hgb urine dipstick: NEGATIVE
KETONES UR: 15 mg/dL — AB
NITRITE: NEGATIVE
Protein, ur: 30 mg/dL — AB
SPECIFIC GRAVITY, URINE: 1.02 (ref 1.005–1.030)
pH: 6.5 (ref 5.0–8.0)

## 2015-01-07 LAB — URINE MICROSCOPIC-ADD ON

## 2015-01-07 LAB — COMPREHENSIVE METABOLIC PANEL
ALK PHOS: 136 U/L — AB (ref 38–126)
ALT: 12 U/L — AB (ref 14–54)
AST: 18 U/L (ref 15–41)
Albumin: 3.2 g/dL — ABNORMAL LOW (ref 3.5–5.0)
Anion gap: 11 (ref 5–15)
BILIRUBIN TOTAL: 0.7 mg/dL (ref 0.3–1.2)
BUN: 6 mg/dL (ref 6–20)
CALCIUM: 9 mg/dL (ref 8.9–10.3)
CHLORIDE: 106 mmol/L (ref 101–111)
CO2: 22 mmol/L (ref 22–32)
CREATININE: 0.59 mg/dL (ref 0.44–1.00)
Glucose, Bld: 98 mg/dL (ref 65–99)
Potassium: 3.7 mmol/L (ref 3.5–5.1)
Sodium: 139 mmol/L (ref 135–145)
Total Protein: 6.9 g/dL (ref 6.5–8.1)

## 2015-01-07 LAB — CBC
HEMATOCRIT: 33.3 % — AB (ref 36.0–46.0)
HEMOGLOBIN: 10.9 g/dL — AB (ref 12.0–15.0)
MCH: 29.3 pg (ref 26.0–34.0)
MCHC: 32.7 g/dL (ref 30.0–36.0)
MCV: 89.5 fL (ref 78.0–100.0)
Platelets: 207 10*3/uL (ref 150–400)
RBC: 3.72 MIL/uL — AB (ref 3.87–5.11)
RDW: 12.9 % (ref 11.5–15.5)
WBC: 16.8 10*3/uL — AB (ref 4.0–10.5)

## 2015-01-07 MED ORDER — CYCLOBENZAPRINE HCL 5 MG PO TABS
5.0000 mg | ORAL_TABLET | Freq: Once | ORAL | Status: AC
  Administered 2015-01-07: 5 mg via ORAL
  Filled 2015-01-07: qty 1

## 2015-01-07 MED ORDER — ONDANSETRON 4 MG PO TBDP
4.0000 mg | ORAL_TABLET | Freq: Once | ORAL | Status: AC
  Administered 2015-01-07: 4 mg via ORAL
  Filled 2015-01-07: qty 1

## 2015-01-07 NOTE — Discharge Instructions (Signed)

## 2015-01-07 NOTE — MAU Note (Addendum)
C/o N&V since 0300; c/o ? Contractions that were 25 minutes apart since 0300; c/o headache this AM; hx of panic attacks;

## 2015-01-07 NOTE — MAU Provider Note (Signed)
History    CSN: [id number] Arrival date and time: 01/07/15 7824 First Provider Initiated Contact with Patient 01/07/15 1036     Chief Complaint  Patient presents with  . Nausea  . Emesis   HPI Patient is 24 y.o. G1P0 79w6dhere with complaints of nausea, vomiting and diarrhea starting at 0300. Patient reports she woke out of sleep with upper back and abdominal pain, the pain started in the back and wrapped towards the front and described as a squeezing. She reports after this pain started she had vomiting. She vomited "several times" and reports dry heaving. She reports she has green colored vomit. No family members with similar sx and has eaten the same things as her mother and sister for the last 2 meals and they are not having these sx. She reports started having watery diarrhea this morning about 2 episodes.  No known sick contacts. She was able to drink sprite her in the MAU without problem.   +FM, denies LOF, VB, contractions, vaginal discharge.   OB History    Gravida Para Term Preterm AB TAB SAB Ectopic Multiple Living   1         0      Past Medical History  Diagnosis Date  . Anxiety   . Panic attacks   . GERD (gastroesophageal reflux disease)   . Panic attacks   . Family history of adverse reaction to anesthesia   . Panic attack     Past Surgical History  Procedure Laterality Date  . No past surgeries    . Esophagogastroduodenoscopy (egd) with propofol N/A 01/11/2014    SMPN:[id number]-erosive  . Esophageal biopsy N/A 01/11/2014    Procedure: BIOPSY;  Surgeon: SDanie Binder MD;  Location: [facility];  Service: Endoscopy;  Laterality: N/A;  duodenal and gastric    Family History  Problem Relation Age of Onset  . Anxiety disorder Mother   . Heart disease Maternal Grandfather   . Colon cancer Other   . Cancer Maternal Grandmother     breast    Social History  Substance Use Topics  . Smoking status: Never Smoker   . Smokeless tobacco: Never Used  .  Alcohol Use: No     Comment: occasional    Allergies: No Known Allergies  No prescriptions prior to admission    Review of Systems  Constitutional: Negative for fever and chills.  Eyes: Negative for blurred vision and double vision.  Respiratory: Negative for cough and shortness of breath.   Cardiovascular: Negative for chest pain and orthopnea.  Gastrointestinal: Positive for nausea, vomiting, abdominal pain and diarrhea.  Genitourinary: Negative for dysuria, frequency and flank pain.  Musculoskeletal: Negative for myalgias.  Skin: Negative for rash.  Neurological: Negative for dizziness, tingling, weakness and headaches.  Endo/Heme/Allergies: Does not bruise/bleed easily.  Psychiatric/Behavioral: Negative for depression and suicidal ideas. The patient is not nervous/anxious.    Physical Exam   Blood pressure 106/61, pulse 76, temperature 97.8 F (36.6 C), temperature source Axillary, resp. rate 16, last menstrual period 04/14/2014.  Physical Exam  Nursing note and vitals reviewed. Constitutional: She is oriented to person, place, and time. She appears well-developed and well-nourished. No distress.  Pregnant female  HENT:  Head: Normocephalic and atraumatic.  Eyes: Conjunctivae are normal. No scleral icterus.  Neck: Normal range of motion. Neck supple.  Cardiovascular: Normal rate and intact distal pulses.   Respiratory: Effort normal. She exhibits no tenderness.  GI: Soft. There is no tenderness. There is  no rebound and no guarding.  Gravid  Genitourinary: Vagina normal.  Musculoskeletal: Normal range of motion. She exhibits no edema.  Neurological: She is alert and oriented to person, place, and time.  Skin: Skin is warm and dry. No rash noted.  Psychiatric: She has a normal mood and affect.    Dilation: 1.5 Effacement (%): 50 Cervical Position: Middle Station: -3 Presentation: Vertex Exam by:: Dr Ernestina Patches  MAU Course  Procedures  MDM CBC- mildly elevated  WBC 16.8 CMP- electrolytes wnl. LFT wnl except low ALT and high alk phos which are expected UA- s/f keytones (15) Zofran ODT provided  NST 140/mod/+accels, no decels Toco q 4-5 minutes  Assessment and Plan  Lisa Christian is a 24 y.o. G1P0 at 60w6dpresenting with nausea, vomiting, diarrhea consistent with likely viral gastroenteritis. Unlikely pancreatitis or gallbladder related. Less likely food poisoning.  -Discussed importance of hydration and technique for oral rehydration -patient was feeling better after observation and had trial of PO in MAU -Discharge home with return precautions.   KJuanita CraverNFieldstone Center12/17/2016, 9:45 PM

## 2015-01-09 ENCOUNTER — Telehealth: Payer: Self-pay | Admitting: Women's Health

## 2015-01-09 ENCOUNTER — Ambulatory Visit (INDEPENDENT_AMBULATORY_CARE_PROVIDER_SITE_OTHER): Payer: Medicaid Other | Admitting: Women's Health

## 2015-01-09 ENCOUNTER — Inpatient Hospital Stay (HOSPITAL_COMMUNITY): Payer: Medicaid Other | Admitting: Anesthesiology

## 2015-01-09 ENCOUNTER — Encounter: Payer: Self-pay | Admitting: Women's Health

## 2015-01-09 ENCOUNTER — Encounter (HOSPITAL_COMMUNITY): Payer: Self-pay | Admitting: *Deleted

## 2015-01-09 ENCOUNTER — Inpatient Hospital Stay (HOSPITAL_COMMUNITY)
Admission: AD | Admit: 2015-01-09 | Discharge: 2015-01-12 | DRG: 775 | Disposition: A | Discharge status: Home / Self Care | Payer: Medicaid Other | Source: Ambulatory Visit | Attending: Obstetrics and Gynecology | Admitting: Obstetrics and Gynecology

## 2015-01-09 VITALS — BP 100/58 | HR 70 | Wt 185.0 lb

## 2015-01-09 DIAGNOSIS — Z800 Family history of malignant neoplasm of digestive organs: Secondary | ICD-10-CM | POA: Diagnosis not present

## 2015-01-09 DIAGNOSIS — O480 Post-term pregnancy: Secondary | ICD-10-CM | POA: Diagnosis present

## 2015-01-09 DIAGNOSIS — Z3A40 40 weeks gestation of pregnancy: Secondary | ICD-10-CM | POA: Diagnosis not present

## 2015-01-09 DIAGNOSIS — Z3403 Encounter for supervision of normal first pregnancy, third trimester: Secondary | ICD-10-CM

## 2015-01-09 DIAGNOSIS — Z1389 Encounter for screening for other disorder: Secondary | ICD-10-CM

## 2015-01-09 DIAGNOSIS — O99344 Other mental disorders complicating childbirth: Secondary | ICD-10-CM | POA: Diagnosis present

## 2015-01-09 DIAGNOSIS — Z8249 Family history of ischemic heart disease and other diseases of the circulatory system: Secondary | ICD-10-CM | POA: Diagnosis not present

## 2015-01-09 DIAGNOSIS — Z803 Family history of malignant neoplasm of breast: Secondary | ICD-10-CM | POA: Diagnosis not present

## 2015-01-09 DIAGNOSIS — F419 Anxiety disorder, unspecified: Secondary | ICD-10-CM | POA: Diagnosis present

## 2015-01-09 DIAGNOSIS — O9962 Diseases of the digestive system complicating childbirth: Secondary | ICD-10-CM | POA: Diagnosis present

## 2015-01-09 DIAGNOSIS — O4290 Premature rupture of membranes, unspecified as to length of time between rupture and onset of labor, unspecified weeks of gestation: Secondary | ICD-10-CM | POA: Diagnosis present

## 2015-01-09 DIAGNOSIS — K219 Gastro-esophageal reflux disease without esophagitis: Secondary | ICD-10-CM | POA: Diagnosis present

## 2015-01-09 DIAGNOSIS — O42913 Preterm premature rupture of membranes, unspecified as to length of time between rupture and onset of labor, third trimester: Secondary | ICD-10-CM | POA: Diagnosis present

## 2015-01-09 DIAGNOSIS — O283 Abnormal ultrasonic finding on antenatal screening of mother: Secondary | ICD-10-CM | POA: Diagnosis present

## 2015-01-09 DIAGNOSIS — Z818 Family history of other mental and behavioral disorders: Secondary | ICD-10-CM

## 2015-01-09 DIAGNOSIS — Z331 Pregnant state, incidental: Secondary | ICD-10-CM

## 2015-01-09 DIAGNOSIS — Z3400 Encounter for supervision of normal first pregnancy, unspecified trimester: Secondary | ICD-10-CM

## 2015-01-09 LAB — POCT URINALYSIS DIPSTICK
GLUCOSE UA: NEGATIVE
KETONES UA: NEGATIVE
Nitrite, UA: NEGATIVE

## 2015-01-09 LAB — CBC
HEMATOCRIT: 30.5 % — AB (ref 36.0–46.0)
Hemoglobin: 10 g/dL — ABNORMAL LOW (ref 12.0–15.0)
MCH: 29.1 pg (ref 26.0–34.0)
MCHC: 32.8 g/dL (ref 30.0–36.0)
MCV: 88.7 fL (ref 78.0–100.0)
PLATELETS: 241 10*3/uL (ref 150–400)
RBC: 3.44 MIL/uL — ABNORMAL LOW (ref 3.87–5.11)
RDW: 13.3 % (ref 11.5–15.5)
WBC: 8.8 10*3/uL (ref 4.0–10.5)

## 2015-01-09 LAB — TYPE AND SCREEN
ABO/RH(D): B POS
ANTIBODY SCREEN: NEGATIVE

## 2015-01-09 MED ORDER — LORAZEPAM 2 MG/ML IJ SOLN
1.0000 mg | Freq: Four times a day (QID) | INTRAMUSCULAR | Status: DC | PRN
  Filled 2015-01-09: qty 0.5

## 2015-01-09 MED ORDER — OXYTOCIN 40 UNITS IN LACTATED RINGERS INFUSION - SIMPLE MED: 62.5000 mL/h | INTRAVENOUS | Status: DC

## 2015-01-09 MED ORDER — EPHEDRINE 5 MG/ML INJ
10.0000 mg | INTRAVENOUS | Status: AC | PRN
  Administered 2015-01-10: 10 mg via INTRAVENOUS
  Administered 2015-01-10: 5 mg via INTRAVENOUS
  Filled 2015-01-09 (×2): qty 4

## 2015-01-09 MED ORDER — ACETAMINOPHEN 325 MG PO TABS: 650.0000 mg | ORAL_TABLET | ORAL | Status: DC | PRN

## 2015-01-09 MED ORDER — LACTATED RINGERS IV SOLN
INTRAVENOUS | Status: DC
  Administered 2015-01-09 – 2015-01-10 (×2): via INTRAVENOUS

## 2015-01-09 MED ORDER — OXYCODONE-ACETAMINOPHEN 5-325 MG PO TABS: 1.0000 | ORAL_TABLET | ORAL | Status: DC | PRN

## 2015-01-09 MED ORDER — PHENYLEPHRINE 40 MCG/ML (10ML) SYRINGE FOR IV PUSH (FOR BLOOD PRESSURE SUPPORT)
80.0000 ug | PREFILLED_SYRINGE | INTRAVENOUS | Status: DC | PRN
  Filled 2015-01-09: qty 2
  Filled 2015-01-09: qty 20

## 2015-01-09 MED ORDER — OXYTOCIN 40 UNITS IN LACTATED RINGERS INFUSION - SIMPLE MED
1.0000 m[IU]/min | INTRAVENOUS | Status: DC
  Administered 2015-01-09: 2 m[IU]/min via INTRAVENOUS
  Filled 2015-01-09: qty 1000

## 2015-01-09 MED ORDER — ONDANSETRON HCL 4 MG/2ML IJ SOLN: 4.0000 mg | Freq: Four times a day (QID) | INTRAMUSCULAR | Status: DC | PRN

## 2015-01-09 MED ORDER — CITRIC ACID-SODIUM CITRATE 334-500 MG/5ML PO SOLN
30.0000 mL | ORAL | Status: DC | PRN
  Administered 2015-01-09: 30 mL via ORAL
  Filled 2015-01-09: qty 15

## 2015-01-09 MED ORDER — OXYCODONE-ACETAMINOPHEN 5-325 MG PO TABS: 2.0000 | ORAL_TABLET | ORAL | Status: DC | PRN

## 2015-01-09 MED ORDER — LIDOCAINE HCL (PF) 1 % IJ SOLN
INTRAMUSCULAR | Status: DC | PRN
  Administered 2015-01-09: 4 mL via EPIDURAL
  Administered 2015-01-09: 2 mL via EPIDURAL
  Administered 2015-01-09: 4 mL via EPIDURAL

## 2015-01-09 MED ORDER — FENTANYL 2.5 MCG/ML BUPIVACAINE 1/10 % EPIDURAL INFUSION (WH - ANES)
14.0000 mL/h | INTRAMUSCULAR | Status: DC | PRN
  Administered 2015-01-09 – 2015-01-10 (×2): 14 mL/h via EPIDURAL
  Filled 2015-01-09 (×2): qty 125

## 2015-01-09 MED ORDER — LIDOCAINE HCL (PF) 1 % IJ SOLN
30.0000 mL | INTRAMUSCULAR | Status: DC | PRN
  Administered 2015-01-10: 30 mL via SUBCUTANEOUS
  Filled 2015-01-09: qty 30

## 2015-01-09 MED ORDER — LACTATED RINGERS IV SOLN: 500.0000 mL | INTRAVENOUS | Status: DC | PRN

## 2015-01-09 MED ORDER — DIPHENHYDRAMINE HCL 50 MG/ML IJ SOLN: 12.5000 mg | INTRAMUSCULAR | Status: DC | PRN

## 2015-01-09 MED ORDER — SODIUM CHLORIDE 0.9 % IV SOLN
1.0000 g | INTRAVENOUS | Status: DC
  Filled 2015-01-09 (×2): qty 1000

## 2015-01-09 MED ORDER — TERBUTALINE SULFATE 1 MG/ML IJ SOLN
0.2500 mg | Freq: Once | INTRAMUSCULAR | Status: DC | PRN
  Filled 2015-01-09: qty 1

## 2015-01-09 MED ORDER — AMPICILLIN SODIUM 2 G IJ SOLR
2.0000 g | Freq: Once | INTRAMUSCULAR | Status: AC
  Administered 2015-01-09: 2 g via INTRAVENOUS
  Filled 2015-01-09: qty 2000

## 2015-01-09 MED ORDER — OXYTOCIN BOLUS FROM INFUSION: 500.0000 mL | INTRAVENOUS | Status: DC

## 2015-01-09 NOTE — Anesthesia Preprocedure Evaluation (Signed)
Anesthesia Evaluation  Patient identified by MRN, date of birth, ID band Patient awake    Reviewed: Allergy & Precautions, H&P , NPO status , Patient's Chart, lab work & pertinent test results  History of Anesthesia Complications (+) Family history of anesthesia reaction  Airway Mallampati: I  TM Distance: >3 FB Neck ROM: full    Dental no notable dental hx.    Pulmonary neg pulmonary ROS,    Pulmonary exam normal breath sounds clear to auscultation       Cardiovascular negative cardio ROS Normal cardiovascular exam Rhythm:regular Rate:Normal     Neuro/Psych PSYCHIATRIC DISORDERS Anxiety Panic attacksnegative neurological ROS     GI/Hepatic Neg liver ROS, GERD  ,  Endo/Other  negative endocrine ROS  Renal/GU negative Renal ROS  negative genitourinary   Musculoskeletal   Abdominal   Peds  Hematology negative hematology ROS (+)   Anesthesia Other Findings Pregnancy - uncomplicated Platelets and allergies reviewed Denies active cardiac or pulmonary symptoms, METS > 4  Denies blood thinning medications, bleeding disorders, hypertension, asthma, supine hypotension syndrome, previous anesthesia difficulties    Reproductive/Obstetrics (+) Pregnancy                             Anesthesia Physical Anesthesia Plan  ASA: II  Anesthesia Plan: Epidural   Post-op Pain Management:    Induction:   Airway Management Planned:   Additional Equipment:   Intra-op Plan:   Post-operative Plan:   Informed Consent: I have reviewed the patients History and Physical, chart, labs and discussed the procedure including the risks, benefits and alternatives for the proposed anesthesia with the patient or authorized representative who has indicated his/her understanding and acceptance.     Plan Discussed with:   Anesthesia Plan Comments:         Anesthesia Quick Evaluation

## 2015-01-09 NOTE — Progress Notes (Signed)
Work-in Low-risk OB appointment G1P0 5569w1d Estimated Date of Delivery: 01/08/15 BP 100/58 mmHg  Pulse 70  Wt 185 lb (83.915 kg)  LMP 04/14/2014 (Exact Date)  BP, weight, and urine reviewed.  Refer to obstetrical flow sheet for FH & FHR.  Reports good fm.  Denies regular uc's, vb, or uti s/s. Gush of clear fluid yesterday around 1100, trickles after she voids, has filled up a full pad today. Didn't want to go to [facility] until she was sure, states she had a bad experience the other day.   SSE: small amt clear fluid, mucousy d/c, nitrazine pos, fern pos SVE: 3[facility]80/-2[facility] resident, L&D charge, and admissions to notify of direct admit for prom F/U here in 4-6wks for pp visit

## 2015-01-09 NOTE — Anesthesia Procedure Notes (Signed)
Epidural Patient location during procedure: OB  Staffing Anesthesiologist: Wells Mabe Performed by: anesthesiologist   Preanesthetic Checklist Completed: patient identified, site marked, surgical consent, pre-op evaluation, timeout performed, IV checked, risks and benefits discussed and monitors and equipment checked  Epidural Patient position: sitting Prep: site prepped and draped and DuraPrep Patient monitoring: continuous pulse ox and blood pressure Approach: midline Location: L4-L5 Injection technique: LOR saline  Needle:  Needle type: Tuohy  Needle gauge: 17 G Needle length: 9 cm and 9 Needle insertion depth: 6 cm Catheter type: closed end flexible Catheter size: 19 Gauge Catheter at skin depth: 10 cm Test dose: negative  Assessment Events: blood not aspirated, injection not painful, no injection resistance, negative IV test and no paresthesia  Additional Notes Patient identified. Risks/Benefits/Options discussed with patient including but not limited to bleeding, infection, nerve damage, paralysis, failed block, incomplete pain control, headache, blood pressure changes, nausea, vomiting, reactions to medications, itching and postpartum back pain. Confirmed with bedside nurse the patient's most recent platelet count. Confirmed with patient that they are not currently taking any anticoagulation, have any bleeding history or any family history of bleeding disorders. Patient expressed understanding and wished to proceed. All questions were answered. Sterile technique was used throughout the entire procedure. Please see nursing notes for vital signs. Test dose was given through epidural catheter and negative prior to continuing to dose epidural or start infusion. Warning signs of high block given to the patient including shortness of breath, tingling/numbness in hands, complete motor block, or any concerning symptoms with instructions to call for help. Patient was given  instructions on fall risk and not to get out of bed. All questions and concerns addressed with instructions to call with any issues or inadequate analgesia.  Reason for block:procedure for pain

## 2015-01-09 NOTE — Telephone Encounter (Signed)
[facility] for diarrhea, vomiting, upper back pain and abdominal pain. Was given fluids, Zofran, and muscle relaxer.   Pt states had leakage that ran down her leg yesterday and last night, +FM, no vaginal bleeding. Pt given an appt today for evaluation.

## 2015-01-09 NOTE — H&P (Signed)
LABOR ADMISSION HISTORY AND PHYSICAL  Lisa Christian is a 24 y.o. female G1P0 with IUP at [redacted]w[redacted]d by  presenting for PROM @ 11 AM on 12/18. Patient denies any contractions currently. She reports +FM, no VB, no blurry vision, headaches or peripheral edema, and RUQ pain.  She plans on breast feeding. She request unsure/OCPs for birth control.  Dating: By 12 wk u/s --->  Estimated Date of Delivery: 01/08/15  Sono:    w2d, CWD, normal anatomy except LV EICF, cephalic presentation 1274 g, 16.1% EFW  Prenatal History/Complications: Maternal Anxiety   [facility]  Initiated Care at  13wks  FOB Forrest Moron, 23yo, 2nd baby, dating  Dating By 12wks  Pap 04/25/14: neg  GC/CT Initial:  -/-              36+wks:  -/-  Genetic Screen NT/IT: neg  CF screen neg  Anatomic [place] Normal female except Lt EICF  Repeat: still present, all other cardiac structures normal  Flu vaccine Declined 11/05/14  Tdap Recommended ~ 28wks  Glucose Screen  2 hr normal: 84/127/105  GBS neg  Feed Preference breast  Contraception Undecided, discussed  Circumcision n/a  Childbirth Classes Interested, info given- didn't make it- recommended tour  Pediatrician Undecided, info given     Past Medical History: Past Medical History  Diagnosis Date  . Anxiety   . Panic attacks   . GERD (gastroesophageal reflux disease)   . Panic attacks   . Family history of adverse reaction to anesthesia   . Panic attack     Past Surgical History: Past Surgical History  Procedure Laterality Date  . No past surgeries    . Esophagogastroduodenoscopy (egd) with propofol N/A 01/11/2014    WRU:EAVWUJWJX/BJYN non-erosive  . Esophageal biopsy N/A 01/11/2014    Procedure: BIOPSY;  Surgeon: West Bali, MD;  Location: [facility];  Service: Endoscopy;  Laterality: N/A;  duodenal and gastric    Obstetrical History: OB History    Gravida Para Term Preterm AB TAB SAB Ectopic Multiple Living   1         0      Social  History: Social History   Social History  . Marital Status: Single    Spouse Name: N/A  . Number of Children: N/A  . Years of Education: N/A   Occupational History  . cosmetology student    Social History Main Topics  . Smoking status: Never Smoker   . Smokeless tobacco: Never Used  . Alcohol Use: No     Comment: occasional  . Drug Use: No  . Sexual Activity: Not Currently    Birth Control/ Protection: None   Other Topics Concern  . None   Social History Narrative    Family History: Family History  Problem Relation Age of Onset  . Anxiety disorder Mother   . Heart disease Maternal Grandfather   . Colon cancer Other   . Cancer Maternal Grandmother     breast    Allergies: No Known Allergies  Prescriptions prior to admission  Medication Sig Dispense Refill Last Dose  . Prenatal Vit-Fe Fumarate-FA (MULTIVITAMIN-PRENATAL) 27-0.8 MG TABS tablet Take 1 tablet by mouth daily at 12 noon.   Taking     Review of Systems   All systems reviewed and negative except as stated in HPI  Ht  (1.676 m)  Wt 185 lb (83.915 kg)  BMI 29.87 kg/m2  LMP 04/14/2014 (Exact Date) General appearance: alert and cooperative Lungs:  clear to auscultation bilaterally Heart: regular rate and rhythm Abdomen: soft, non-tender; bowel sounds normal Extremities: Homans sign is negative, no sign of DVT, edema Presentation: cephalic Fetal monitoringBaseline: 130 bpm, Variability: Good {> 6 bpm), Accelerations: Reactive and Decelerations: Absent Uterine activity: None Dilation: 3 Effacement (%): 80 Station: -2 Exam by:: Earlene Plateravis, RN   Prenatal labs: ABO, Rh: B/Positive/-- (06/14 1146) Antibody: Negative (09/29 0927) Rubella: !Error! 1.77 RPR: Non Reactive (09/29 0927)  HBsAg: Negative (06/14 1146)  HIV: Non Reactive (09/29 0927)  GBS:   Negative  2 hr Glucola 84/127/105 Genetic screening  NT negative  Anatomy US LV EICF   Prenatal Transfer Tool  Maternal Diabetes: No Genetic  Screening: Normal Maternal Ultrasounds/Referrals: Normal Fetal Ultrasounds or other Referrals:  None Maternal Substance Abuse:  No Significant Maternal Medications:  None Significant Maternal Lab Results: None  Results for orders placed or performed in visit on 01/09/15 (from the past 24 hour(s))  POCT Urinalysis Dipstick   Collection Time: 01/09/15  3:11 PM  Result Value Ref Range   Color, UA     Clarity, UA     Glucose, UA neg    Bilirubin, UA     Ketones, UA neg    Spec Grav, UA     Blood, UA 2+    pH, UA     Protein, UA trace    Urobilinogen, UA     Nitrite, UA neg    Leukocytes, UA moderate (2+) (A) Negative    Patient Active Problem List   Diagnosis Date Noted  . PROM (premature rupture of membranes) 01/09/2015  . Fetal echogenic intracardiac focus on prenatal ultrasound 10/31/2014  . Supervision of normal first pregnancy 07/05/2014  . CIN I (cervical intraepithelial neoplasia I) 04/25/2014  . GERD (gastroesophageal reflux disease) 11/24/2013  . 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 Regurgitation 11/24/2013    Assessment:  Lisa Christian is a 24 y.o. G1P0 at 4519w1d here for PROM @ 11 AM on 12/18. Started Ampicillin due to being over 24 hrs ruptured   #Labor: Augment with Pitocin  #Pain: Epidural  #FWB: Category 1 #ID:  GBS negative  #MOF: Breast  #MOC: Undecided  #Circ:  N/A  Lisa CharsAsiyah Mikell, MD    CNM attestation:  I have seen and examined this patient; I agree with above documentation in the Resident's note.    Lisa Christian, CNM 2:15 PM

## 2015-01-10 ENCOUNTER — Encounter: Payer: Medicaid Other | Admitting: Women's Health

## 2015-01-10 ENCOUNTER — Encounter (HOSPITAL_COMMUNITY): Payer: Self-pay | Admitting: *Deleted

## 2015-01-10 DIAGNOSIS — O480 Post-term pregnancy: Secondary | ICD-10-CM

## 2015-01-10 DIAGNOSIS — O99344 Other mental disorders complicating childbirth: Secondary | ICD-10-CM

## 2015-01-10 DIAGNOSIS — F419 Anxiety disorder, unspecified: Secondary | ICD-10-CM

## 2015-01-10 DIAGNOSIS — O42913 Preterm premature rupture of membranes, unspecified as to length of time between rupture and onset of labor, third trimester: Secondary | ICD-10-CM

## 2015-01-10 DIAGNOSIS — Z3A40 40 weeks gestation of pregnancy: Secondary | ICD-10-CM

## 2015-01-10 DIAGNOSIS — O9962 Diseases of the digestive system complicating childbirth: Secondary | ICD-10-CM

## 2015-01-10 DIAGNOSIS — K219 Gastro-esophageal reflux disease without esophagitis: Secondary | ICD-10-CM

## 2015-01-10 LAB — ABO/RH: ABO/RH(D): B POS

## 2015-01-10 LAB — RPR: RPR Ser Ql: NONREACTIVE

## 2015-01-10 MED ORDER — LANOLIN HYDROUS EX OINT: TOPICAL_OINTMENT | CUTANEOUS | Status: DC | PRN

## 2015-01-10 MED ORDER — ACETAMINOPHEN 325 MG PO TABS
650.0000 mg | ORAL_TABLET | ORAL | Status: DC | PRN
Start: 2015-01-10 — End: 2015-01-12
  Administered 2015-01-11: 650 mg via ORAL
  Filled 2015-01-10: qty 2

## 2015-01-10 MED ORDER — PRENATAL MULTIVITAMIN CH
1.0000 | ORAL_TABLET | Freq: Every day | ORAL | Status: DC
  Administered 2015-01-10 – 2015-01-12 (×3): 1 via ORAL
  Filled 2015-01-10 (×3): qty 1

## 2015-01-10 MED ORDER — SIMETHICONE 80 MG PO CHEW: 80.0000 mg | CHEWABLE_TABLET | ORAL | Status: DC | PRN

## 2015-01-10 MED ORDER — BENZOCAINE-MENTHOL 20-0.5 % EX AERO
1.0000 "application " | INHALATION_SPRAY | CUTANEOUS | Status: DC | PRN
  Filled 2015-01-10: qty 56

## 2015-01-10 MED ORDER — OXYCODONE-ACETAMINOPHEN 5-325 MG PO TABS: 1.0000 | ORAL_TABLET | ORAL | Status: DC | PRN

## 2015-01-10 MED ORDER — SENNOSIDES-DOCUSATE SODIUM 8.6-50 MG PO TABS: 2.0000 | ORAL_TABLET | ORAL | Status: DC

## 2015-01-10 MED ORDER — ONDANSETRON HCL 4 MG PO TABS: 4.0000 mg | ORAL_TABLET | ORAL | Status: DC | PRN

## 2015-01-10 MED ORDER — ZOLPIDEM TARTRATE 5 MG PO TABS: 5.0000 mg | ORAL_TABLET | Freq: Every evening | ORAL | Status: DC | PRN

## 2015-01-10 MED ORDER — ACETAMINOPHEN 325 MG PO TABS: 650.0000 mg | ORAL_TABLET | ORAL | Status: DC | PRN

## 2015-01-10 MED ORDER — DIPHENHYDRAMINE HCL 25 MG PO CAPS: 25.0000 mg | ORAL_CAPSULE | Freq: Four times a day (QID) | ORAL | Status: DC | PRN

## 2015-01-10 MED ORDER — OXYCODONE-ACETAMINOPHEN 5-325 MG PO TABS: 2.0000 | ORAL_TABLET | ORAL | Status: DC | PRN

## 2015-01-10 MED ORDER — IBUPROFEN 600 MG PO TABS: 600.0000 mg | ORAL_TABLET | Freq: Four times a day (QID) | ORAL | Status: DC

## 2015-01-10 MED ORDER — IBUPROFEN 600 MG PO TABS
600.0000 mg | ORAL_TABLET | Freq: Four times a day (QID) | ORAL | Status: DC
  Administered 2015-01-10 – 2015-01-12 (×8): 600 mg via ORAL
  Filled 2015-01-10 (×9): qty 1

## 2015-01-10 MED ORDER — PRENATAL MULTIVITAMIN CH: 1.0000 | ORAL_TABLET | Freq: Every day | ORAL | Status: DC

## 2015-01-10 MED ORDER — WITCH HAZEL-GLYCERIN EX PADS: 1.0000 "application " | MEDICATED_PAD | CUTANEOUS | Status: DC | PRN

## 2015-01-10 MED ORDER — DIBUCAINE 1 % RE OINT: 1.0000 "application " | TOPICAL_OINTMENT | RECTAL | Status: DC | PRN

## 2015-01-10 MED ORDER — BENZOCAINE-MENTHOL 20-0.5 % EX AERO: 1.0000 "application " | INHALATION_SPRAY | CUTANEOUS | Status: DC | PRN

## 2015-01-10 MED ORDER — ONDANSETRON HCL 4 MG/2ML IJ SOLN: 4.0000 mg | INTRAMUSCULAR | Status: DC | PRN

## 2015-01-10 MED ORDER — TETANUS-DIPHTH-ACELL PERTUSSIS 5-2.5-18.5 LF-MCG/0.5 IM SUSP
0.5000 mL | Freq: Once | INTRAMUSCULAR | Status: AC
  Administered 2015-01-11: 0.5 mL via INTRAMUSCULAR
  Filled 2015-01-10: qty 0.5

## 2015-01-10 MED ORDER — ZOLPIDEM TARTRATE 5 MG PO TABS
5.0000 mg | ORAL_TABLET | Freq: Every evening | ORAL | Status: DC | PRN
Start: 2015-01-10 — End: 2015-01-12

## 2015-01-10 MED ORDER — TETANUS-DIPHTH-ACELL PERTUSSIS 5-2.5-18.5 LF-MCG/0.5 IM SUSP: 0.5000 mL | Freq: Once | INTRAMUSCULAR | Status: DC

## 2015-01-10 MED ORDER — BUPIVACAINE HCL (PF) 0.25 % IJ SOLN
INTRAMUSCULAR | Status: DC | PRN
  Administered 2015-01-10 (×2): 5 mL via EPIDURAL

## 2015-01-10 MED ORDER — SENNOSIDES-DOCUSATE SODIUM 8.6-50 MG PO TABS
2.0000 | ORAL_TABLET | ORAL | Status: DC
  Administered 2015-01-10: 2 via ORAL
  Filled 2015-01-10 (×2): qty 2

## 2015-01-10 NOTE — Lactation Note (Signed)
This note was copied from the chart of Girl Ralene Muskrataylor Batts. Lactation Consultation Note  Patient Name: Girl Ralene Muskrataylor Batts ZOXWR'UToday's Date: 01/10/2015 Reason for consult: Initial assessment Baby at 10 hr of life and mom feels like the baby is not feeding well. She thinks that sometimes baby is flutter sucking and other times she falls asleep after 2-3 good sucks. Baby's mouth is very wet with bubbles in the cheeks. She bunches her tongue in the back of her mouth. Baby does have some what of a heart shape tip to her tongue when she bunches it up but she can extend tongue past gum ridge, and lift past midline. She tucks her lips in when she suck, she has a thin tight upper labial frenulum. She will suck 3-4 times then bite down. During the first latch baby started clicking when she was at the breast. Had mom take her off, reposition baby and put her back on, the clicks stopped. Mom denies breast or nipple pain. Nipples appear normal at this time. Mom stated that she can manually express and has seen colostrum bilaterally but [facility]. Discussed baby behavior, feeding frequency, baby belly size, voids, wt loss, breast changes, and nipple care. Given lactation handouts. Aware of OP services and support group.   Maternal Data Has patient been taught Hand Expression?: Yes Does the patient have breastfeeding experience prior to this delivery?: No  Feeding Feeding Type: Breast Fed Length of feed: 15 min  LATCH Score/Interventions Latch: Repeated attempts needed to sustain latch, nipple held in mouth throughout feeding, stimulation needed to elicit sucking reflex. Intervention(s): Adjust position;Breast compression  Audible Swallowing: Spontaneous and intermittent Intervention(s): Hand expression;Skin to skin  Type of Nipple: Everted at rest and after stimulation  Comfort (Breast/Nipple): Soft / non-tender     Hold (Positioning): Assistance needed to  correctly position infant at breast and maintain latch. Intervention(s): Position options;Support Pillows  LATCH Score: 8  Lactation Tools Discussed/Used     Consult Status Consult Status: Follow-up Date: 01/11/15 Follow-up type: In-patient    Rulon Eisenmengerlizabeth E Orva Riles 01/10/2015, 7:00 PM

## 2015-01-10 NOTE — Anesthesia Postprocedure Evaluation (Signed)
Anesthesia Post Note  Patient: Lisa Christian  Procedure(s) Performed: * No procedures listed *  Patient location during evaluation: Mother Baby Anesthesia Type: Epidural Level of consciousness: awake and alert and oriented Pain management: satisfactory to patient Vital Signs Assessment: post-procedure vital signs reviewed and stable Respiratory status: spontaneous breathing and nonlabored ventilation Cardiovascular status: stable Postop Assessment: no headache, no backache, no signs of nausea or vomiting, adequate PO intake and patient able to bend at knees (patient up walking) Anesthetic complications: no    Last Vitals:  Filed Vitals:   01/10/15 0915 01/10/15 1044  BP: 116/82 108/63  Pulse: 80 66  Temp:  37.2 C  Resp: 18 16    Last Pain:  Filed Vitals:   01/10/15 1756  PainSc: 4                  Zorana Brockwell

## 2015-01-10 NOTE — Progress Notes (Signed)
Labor Progress Note Haywood Lassoaylor M Batts is a 24 y.o. G1P0 at 1688w2d presented for IOL secondary to PROM. Currently has pit at 10. Epidural in place.   S:  Comfortable with epidural. No concerns.   O:  BP 107/70 mmHg  Pulse 76  Temp(Src) 98.8 F (37.1 C) (Oral)  Resp 20  Ht 5\' 6"  (1.676 m)  Wt 83.915 kg (185 lb)  BMI 29.87 kg/m2  SpO2 99%  LMP 04/14/2014 (Exact Date) EFM: 135/mod/+accels  CVE: Dilation: 5.5 Effacement (%): 90 Cervical Position: Middle Station: 0 Presentation: Vertex Exam by:: E. Siska, RN   A&P: 24 y.o. G1P0 1188w2d undergoing IOL for PROM. Now on pitocin.  #Labor: Continue pitocin, ctx q3-564min.  #Pain: Epidural in place #FWB:Cat 1 strip #GBS NEGATIVE  Olivia CanterAmanda Tahtiana Rozier, MD 12:56 AM

## 2015-01-11 LAB — CBC
HEMATOCRIT: 24.6 % — AB (ref 36.0–46.0)
Hemoglobin: 8 g/dL — ABNORMAL LOW (ref 12.0–15.0)
MCH: 29.4 pg (ref 26.0–34.0)
MCHC: 32.5 g/dL (ref 30.0–36.0)
MCV: 90.4 fL (ref 78.0–100.0)
Platelets: 190 10*3/uL (ref 150–400)
RBC: 2.72 MIL/uL — AB (ref 3.87–5.11)
RDW: 13.5 % (ref 11.5–15.5)
WBC: 9.9 10*3/uL (ref 4.0–10.5)

## 2015-01-11 NOTE — Lactation Note (Addendum)
This note was copied from the chart of Lisa Christian. Lactation Consultation Note  Patient Name: Lisa Christian ZOXWR'UToday's Date: 01/11/2015 Reason for consult: Follow-up assessment;Difficult latch Baby 25 hours old. Mom states that she is extremely tired, and has not been up to the bathroom since last night. Mom up to bathroom and all her pillows repositioned. Demonstrated to mom how to position herself to achieve a deeper latch. Mom reports her right nipple larger than left. Assisted to latch baby in football position to right breast. Mom able to easily express colostrum, and baby latched deeply, suckling rhythmically with a few swallows noted. Enc mom to offer lots of STS and attempts at breast. Enc mom to use cross-cradle on left breast if this position works better for her on left breast. Enc mom to call her nurse for assistance with latching as needed. Enc mom to ask her mother, who is staying in the room with her, to watch/hold baby so that [name] can rest. [name] visibly more relaxed when her mother left the room. Enc [name] to be upfront with her needs and focus on getting some rest and caring for the baby.   Discussed assessment and interventions with Dr. Margo AyeHall, pediatrician and patient's bedside nurse Val, RN.  Maternal Data    Feeding Feeding Type: Breast Fed Length of feed:  (LC assessed first 15 minutes of BF. )  LATCH Score/Interventions Latch: Grasps breast easily, tongue down, lips flanged, rhythmical sucking. Intervention(s): Adjust position;Assist with latch;Breast compression  Audible Swallowing: A few with stimulation Intervention(s): Skin to skin;Hand expression  Type of Nipple: Everted at rest and after stimulation  Comfort (Breast/Nipple): Soft / non-tender     Hold (Positioning): Assistance needed to correctly position infant at breast and maintain latch. Intervention(s): Breastfeeding basics reviewed;Support Pillows;Position options;Skin to skin  LATCH Score:  8  Lactation Tools Discussed/Used     Consult Status Consult Status: Follow-up Date: 01/12/15 Follow-up type: In-patient    Geralynn OchsWILLIARD, Jamarie Joplin 01/11/2015, 10:16 AM

## 2015-01-11 NOTE — Progress Notes (Signed)
Post Partum Day 1 Subjective:  Lisa Christian is a 24 y.o. G1P1001 5559w2d s/p NSVD after IOL for PPROM.  No acute events overnight.  Pt denies problems with ambulating, voiding or po intake.  She denies nausea or vomiting.  Pain is well controlled.  She has had flatus.  Lochia Small.  Plan for birth control is undecided.  Method of Feeding: Breast.   Objective: Blood pressure 88/44, pulse 65, temperature 98.3 F (36.8 C), temperature source Oral, resp. rate 17, height 5\' 6"  (1.676 m), weight 83.915 kg (185 lb), last menstrual period 04/14/2014, SpO2 99 %, unknown if currently breastfeeding.  Physical Exam:  General: alert, cooperative and no distress Lochia:normal flow Chest: normal WOB Heart: Regular rate Abdomen: +BS, soft, mild TTP (appropriate) Uterine Fundus: firm, below umbillicus DVT Evaluation: No evidence of DVT seen on physical exam. Extremities: No edema   Recent Labs  01/09/15 1830 01/11/15 0556  HGB 10.0* 8.0*  HCT 30.5* 24.6*    Assessment/Plan:  ASSESSMENT: Lisa Christian is a 24 y.o. G1P1001 559w2d s/p NSVD.   Plan for discharge tomorrow Continue routine PP care Breastfeeding support PRN  LOS: 2 days   Olivia Cantermanda Schultz 01/11/2015, 7:58 AM   OB fellow attestation:  I have seen and examined this patient; I agree with above documentation in the resident's note.   Federico FlakeKimberly Niles Newton, MD 8:57 AM

## 2015-01-11 NOTE — Clinical Social Work Maternal (Signed)
CLINICAL SOCIAL WORK MATERNAL/CHILD NOTE  Patient Details  Name: Lisa Christian MRN: [id number] Date of Birth: 1990-07-21  Date:  01/11/2015  Clinical Social Worker Initiating Note:  Loleta Books MSW, LCSW Date/ Time Initiated:  01/11/15/1000    Child's Name:  Lisa Christian   Legal Guardian:  Ralene Muskrat and Forrest Moron  Need for Interpreter:  None   Date of Referral:  01/10/15     Reason for Referral:  History of anxiety and panic attacks   Referral Source:  [facility]   Address:  [address] [place], [place] [address]  Phone number:  [phone]   Household Members:  Parents, Siblings   Natural Supports (not living in the home):  Immediate Family   Professional Supports: None   Employment: Unemployed   Type of Work:   N/A  Education:    N/A  Surveyor, quantity Resources:  Medicaid   Other Resources:  [facility]   Cultural/Religious Considerations Which May Impact Care:  None reported  Strengths:  Ability to meet basic needs , Home prepared for child , Pediatrician chosen    Risk Factors/Current Problems:   1. Mental Health Concerns: Per [name], she presents with history of anxiety since "forever".  [name] endorsed psychosocial stressors during the pregnancy, and shared that she has participated in therapy during the pregnancy.     Cognitive State:  Able to Concentrate , Alert , Goal Oriented , Linear Thinking    Mood/Affect:  Anxious , Overwhelmed    CSW Assessment:  CSW received request for consult due to [name] presenting with a history of anxiety and panic attacks.  [name] was alone for majority of the visit, but her younger sister and mother did arrive at the end of the assessment.  [name] reported feeling tired and exhausted, affect was congruent with her stated mood.  [name] was observed to be attempting to breastfeed during the assessment, was receptive to CSW visit, and expressed appreciation for the visit and support.   [name] reported that labor was long and painful, but  shared belief that it was close to what she had anticipated and expected.  [name] stated that she is primarily frustrated with breastfeeding since it is painful and the infant is having a difficult time latching.  [name] shared that she knew it could be difficult since she supported her mother and friends as they breast fed their children, but shared belief that she is more frustrated since she is tired and has not slept much since the infant has been born.  [name] reported that she is committed to try and continue to breastfeed since she believes that is best.  CSW continued to provide support as the [name] continues to process her feeding preferences and choices for the infant.   [name] stated that she currently lives with her mother and siblings. She reported that she feels well supported, but endorsed presence of relational stress with FOB.  [name] stated that they are not in a relationship, and stated that prior to yesterday, they had not seen each other in months.  CSW provided support as she discussed how she felt as she contemplated whether or not to allow him at the hospital, and how she feels about his level of support moving forward.  She stated that it is difficult to be near him, but also wants the infant to know her father since he is expressing interest in parenting.   [name] endorsed presence of anxiety since "forever".  [name] also confirmed strong family history of anxiety.  [name] denied previous medications for anxiety, and she shared that she prefers to not take any medication. [name] stated that she has previously participated in therapy and has found it helpful. The [name] reported that she currently attends therapy, and [name] is able to attend and accompany her as needed.  [name] and [name] verbalized awareness of benefits of therapy, and how therapy may be helpful as [name] continues to cope with relational stress with FOB and adjustment to motherhood.    [name] acknowledged increase risk for ongoing mental health complications due  to history of anxiety and due anxiety during the pregnancy; however, she expressed belief that based on her prior history she is more prepared to cope and identify symptoms.  She stated that she believes she has insight on her anxiety, and reported that she is receptive to seek out her own therapy appointments if needs arise.   CSW provided education on perinatal mood and anxiety disorders, and explored with [name] non-clinical interventions to support her mental health as she transitions home.  [name] stated that she knows that it is important to sleep, but also reported that it is difficult for her to accept help in order for her to sleep.  [name] denied questions, concerns, or needs at this time. She acknowledged ongoing role of CSW, and agreed to contact CSW if needs arise.   CSW Plan/Description:   1. Patient/Family Education: Perinatal mood disorders   2. No Further Intervention Required/No Barriers to Discharge   Kelby FamVenning, Albena Comes N, LCSW 01/11/2015, 11:12 AM

## 2015-01-12 MED ORDER — IBUPROFEN 600 MG PO TABS: 600.0000 mg | ORAL_TABLET | Freq: Four times a day (QID) | ORAL | Status: DC

## 2015-01-12 NOTE — Lactation Note (Signed)
This note was copied from the chart of Lisa Christian. Lactation Consultation Note  Patient Name: Lisa Christian JYNWG'NToday's Date: 01/12/2015 Reason for consult: Follow-up assessment Visited with Mom on day of discharge, baby 9650 hrs old.  Mom stated feedings are getting much better.  Baby in Mom's arms cueing to feed.  Suggested she latch baby while LC there to help if needed.  Mom needed guidance as she positioned baby in cradle hold with baby facing up.  Baby latched onto nipple only.  Offered to assist her to use the cross cradle hold.  Baby latched deeply and proceeded to suck/swallow consistently.  Lots of basic teaching done.  Encouraged skin to skin, and cue based feedings.  Engorgement prevention and teaching discussed.  Reminded Mom of OP lactation services available to her.  Encouraged her to call prn for assistance.  Consult Status Consult Status: Complete Date: 01/12/15 Follow-up type: Call as needed    [name], [name] 01/12/2015, 10:55 AM

## 2015-01-12 NOTE — Lactation Note (Addendum)
This note was copied from the chart of Lisa Christian. Lactation Consultation Note Baby has had 6% weight loss d/t 9 stools, 2 voids, and 1 reported emesis, although baby has been spitty. BF has gotten better and will cont. To monitor I&O. Patient Name: Lisa Christian ZOXWR'UToday's Date: 01/12/2015 Reason for consult: Follow-up assessment;Infant weight loss   Maternal Data    Feeding Feeding Type: Breast Fed  LATCH Score/Interventions Latch: Repeated attempts needed to sustain latch, nipple held in mouth throughout feeding, stimulation needed to elicit sucking reflex.  Audible Swallowing: A few with stimulation  Type of Nipple: Everted at rest and after stimulation  Comfort (Breast/Nipple): Soft / non-tender     Hold (Positioning): No assistance needed to correctly position infant at breast.  LATCH Score: 8  Lactation Tools Discussed/Used     Consult Status Consult Status: Follow-up Date: 01/12/15 Follow-up type: In-patient    Tsuneo Faison, Diamond NickelLAURA G 01/12/2015, 1:02 AM

## 2015-01-12 NOTE — Discharge Summary (Signed)
OB Discharge Summary  Patient Name: Lisa Christian DOB: 1990/10/10 MRN: [id number]  Date of admission: 01/09/2015 Delivering MD: Olivia Canter   Date of discharge: 01/12/2015  Admitting diagnosis: direct admit, 40.1w Intrauterine pregnancy: [redacted]w[redacted]d     Secondary diagnosis:Principal Problem:   PROM (premature rupture of membranes) Active Problems:   Supervision of normal first pregnancy   Fetal echogenic intracardiac focus on prenatal ultrasound  Additional problems: None     Discharge diagnosis: Term Pregnancy Delivered                                                                     Post partum procedures:None  Augmentation: Pitocin, Cytotec and Foley Balloon  Complications: None  Hospital course:  Induction of Labor With Vaginal Delivery   24 y.o. yo G1P1001 at [redacted]w[redacted]d was admitted to the hospital 01/09/2015 for induction of labor.  Indication for induction: PROM.  Patient had an uncomplicated labor course as follows: Membrane Rupture Time/Date: 11:00 AM ,01/08/2015   Intrapartum Procedures: Episiotomy: None [1]                                         Lacerations:  2nd degree [3]  Patient had delivery of a Viable infant.  Information for the patient's newborn:  Alva Garnet Girl Trudy [[id number]]  Delivery Method: Vaginal, Spontaneous Delivery (Filed from Delivery Summary)   01/10/2015  Details of delivery can be found in separate delivery note.  Patient had a routine postpartum course. Patient is discharged home No discharge date for patient encounter.    Physical exam  Filed Vitals:   01/10/15 1545 01/10/15 2115 01/11/15 0549 01/12/15 0628  BP: 112/65 114/59 88/44 108/48  Pulse: 66 80 65 63  Temp: 98 F (36.7 C) 98.4 F (36.9 C) 98.3 F (36.8 C) 98 F (36.7 C)  TempSrc: Oral Oral Oral Oral  Resp: Height:      Weight:      SpO2:       General: alert, cooperative and no distress Lochia: appropriate Uterine Fundus: firm Incision: N/A DVT  Evaluation: No evidence of DVT seen on physical exam. No significant calf/ankle edema. Labs: Lab Results  Component Value Date   WBC 9.9 01/11/2015   HGB 8.0* 01/11/2015   HCT 24.6* 01/11/2015   MCV 90.4 01/11/2015   PLT 190 01/11/2015   CMP Latest Ref Rng 01/07/2015  Glucose 65 - 99 mg/dL 98  BUN 6 - 20 mg/dL 6  Creatinine 9.62 - 9.52 mg/dL 8.41  Sodium 324 - 401 mmol/L 139  Potassium 3.5 - 5.1 mmol/L 3.7  Chloride 101 - 111 mmol/L 106  CO2 22 - 32 mmol/L 22  Calcium 8.9 - 10.3 mg/dL 9.0  Total Protein 6.5 - 8.1 g/dL 6.9  Total Bilirubin 0.3 - 1.2 mg/dL 0.7  Alkaline Phos 38 - 126 U/L 136(H)  AST 15 - 41 U/L 18  ALT 14 - 54 U/L 12(L)    Discharge instruction: per After Visit Summary and "Baby and Me Booklet".  After Visit Meds:    Medication List    Notice    You  have not been prescribed any medications.      Diet: routine diet  Activity: Advance as tolerated. Pelvic rest for 6 weeks.   Outpatient follow up:6 weeks Follow up Appt:Future Appointments Date Time Provider Department Center  02/09/2015 1:30 PM Jacklyn ShellFrances Cresenzo-Dishmon, CNM [facility]   Follow up visit: No Follow-up on file.  Postpartum contraception: Undecided  Newborn Data: Live born female  Birth Weight: 9 lb 0.4 oz (4094 g) APGAR: 9, 9  Baby Feeding: Breast Disposition:home with mother   01/12/2015 Olivia CanterAmanda Schultz, MD  CNM attestation I have seen and examined this patient and agree with above documentation in the resident's note.   Haywood Lassoaylor M Batts is a 24 y.o. G1P1001 s/p SVD.   Pain is well controlled.  Plan for birth control is no method.  Method of Feeding: breast  PE:  BP 108/48 mmHg  Pulse 63  Temp(Src) 98 F (36.7 C) (Oral)  Resp 18  Ht 5\' 6"  (1.676 m)  Wt 83.915 kg (185 lb)  BMI 29.87 kg/m2  SpO2 99%  LMP 04/14/2014 (Exact Date)  Breastfeeding? Unknown Fundus firm   Recent Labs  01/09/15 1830 01/11/15 0556  HGB 10.0* 8.0*  HCT 30.5* 24.6*     Plan:  discharge today - postpartum care discussed - f/u clinic in 6 weeks for postpartum visit   Cam HaiSHAW, Johanthan Kneeland, CNM 8:33 AM  01/12/2015

## 2015-01-27 ENCOUNTER — Telehealth: Payer: Self-pay | Admitting: *Deleted

## 2015-01-27 NOTE — Telephone Encounter (Signed)
Pt states delivered on 01/10/2015, had a laceration with delivery, "doesn't look right down there, strained with BM, a lot of vaginal pain." [facility] due to office hours today. Pt verbalized understanding.

## 2015-02-09 ENCOUNTER — Ambulatory Visit: Payer: Medicaid Other | Admitting: Advanced Practice Midwife

## 2015-02-10 ENCOUNTER — Encounter (INDEPENDENT_AMBULATORY_CARE_PROVIDER_SITE_OTHER): Payer: Self-pay

## 2015-02-14 ENCOUNTER — Ambulatory Visit (INDEPENDENT_AMBULATORY_CARE_PROVIDER_SITE_OTHER): Payer: Medicaid Other | Admitting: Women's Health

## 2015-02-14 ENCOUNTER — Encounter: Payer: Self-pay | Admitting: Women's Health

## 2015-02-14 MED ORDER — NORETHINDRONE 0.35 MG PO TABS: ORAL_TABLET | ORAL | Status: DC

## 2015-02-14 NOTE — Patient Instructions (Addendum)
You can use over the counter hemorrhoid creams, etc for hemorrhoids Call [place] to get birth control before you become sexually active  Constipation  Drink plenty of fluid, preferably water, throughout the day  Eat foods high in fiber such as fruits, vegetables, and grains  Exercise, such as walking, is a good way to keep your bowels regular  Drink warm fluids, especially warm prune juice, or decaf coffee  Eat a 1/2 cup of real oatmeal (not instant), 1/2 cup applesauce, and 1/2-1 cup warm prune juice every day  If needed, you may take Colace (docusate sodium) stool softener once or twice a day to help keep the stool soft. If you are pregnant, wait until you are out of your first trimester (12-14 weeks of pregnancy)  If you still are having problems with constipation, you may take Miralax once daily as needed to help keep your bowels regular.  If you are pregnant, wait until you are out of your first trimester (12-14 weeks of pregnancy)

## 2015-02-14 NOTE — Progress Notes (Signed)
Patient ID: Lisa Christian, female   DOB: 28-Nov-1990, 25 y.o.   MRN: [id number] Subjective:    Lisa Christian is a 25 y.o. G13P1001 Caucasian female who presents for a postpartum visit. She is 4 weeks postpartum following a spontaneous vaginal delivery at 40.2 gestational weeks. Anesthesia: epidural. Complicated by mild shoulder dystocia- infant weighed 9lb 0.4oz. I have fully reviewed the prenatal and intrapartum course. Postpartum course has been uncomplicated. Baby's course has been uncomplicated. Baby is feeding by breast/bottle. Bleeding on period now. Bowel function is normal. Bladder function is normal. Patient is not sexually active. Last sexual activity: prior to birth of baby and does not have partner now and doesn't plan to be sexually active. Contraception method is abstinence, wants micronor just in case she becomes sexually active. Postpartum depression screening: negative. Score 3.  Last pap 04/25/14 and was neg.  The following portions of the patient's history were reviewed and updated as appropriate: allergies, current medications, past medical history, past surgical history and problem list.  Review of Systems Pertinent items are noted in HPI.   Filed Vitals:   02/14/15 1505  BP: 110/56  Pulse: 76  Weight: 166 lb (75.297 kg)   Patient's last menstrual period was 02/12/2015.  Objective:   General:  alert, cooperative and no distress   Breasts:  deferred, no complaints  Lungs: clear to auscultation bilaterally  Heart:  regular rate and rhythm  Abdomen: soft, nontender   Vulva: normal  Vagina: normal vagina  Cervix:  closed  Corpus: Well-involuted  Adnexa:  Non-palpable  Rectal Exam: Small external non-thrombosed hemorrhoid x 1        Assessment:   Postpartum exam 4 wks s/p SVB w/ mild shoulder dystocia Breast and bottlefeeding Depression screening Contraception counseling   Plan:   Contraception: Rx micronor w/ 11RF, use if she thinks she'll be sexually active   Follow up in April for physical  Marge Duncans CNM, [facility] 02/14/2015 3:26 PM

## 2015-02-18 ENCOUNTER — Encounter (HOSPITAL_COMMUNITY): Payer: Self-pay | Admitting: *Deleted

## 2015-02-18 ENCOUNTER — Emergency Department (HOSPITAL_COMMUNITY)
Admission: EM | Admit: 2015-02-18 | Discharge: 2015-02-18 | Disposition: A | Discharge status: Home / Self Care | Payer: Medicaid Other | Attending: Emergency Medicine | Admitting: Emergency Medicine

## 2015-02-18 DIAGNOSIS — R195 Other fecal abnormalities: Secondary | ICD-10-CM | POA: Diagnosis not present

## 2015-02-18 DIAGNOSIS — Z8659 Personal history of other mental and behavioral disorders: Secondary | ICD-10-CM | POA: Diagnosis not present

## 2015-02-18 DIAGNOSIS — M549 Dorsalgia, unspecified: Secondary | ICD-10-CM | POA: Insufficient documentation

## 2015-02-18 DIAGNOSIS — K5900 Constipation, unspecified: Secondary | ICD-10-CM | POA: Diagnosis present

## 2015-02-18 NOTE — Discharge Instructions (Signed)
TAKE MIRALAX UP TO 3 TIMES DAILY FOR A MAXIMUM 3 CONSECUTIVE DAYS. YOU CAN USE FLEET SALINE OR MINERAL OIL ENEMAS SAFELY WHILE NURSING. RECOMMEND INCREASED DIETARY FIBER AND LOTS OF WATER. FOLLOW UP WITH YOUR DOCTOR IF NO RELIEF IN 2 DAYS.    Constipation, Adult Constipation is when a person has fewer than three bowel movements a week, has difficulty having a bowel movement, or has stools that are dry, hard, or larger than normal. As people grow older, constipation is more common. A low-fiber diet, not taking in enough fluids, and taking certain medicines may make constipation worse.  CAUSES   Certain medicines, such as antidepressants, pain medicine, iron supplements, antacids, and water pills.   Certain diseases, such as diabetes, irritable bowel syndrome (IBS), thyroid disease, or depression.   Not drinking enough water.   Not eating enough fiber-rich foods.   Stress or travel.   Lack of physical activity or exercise.   Ignoring the urge to have a bowel movement.   Using laxatives too much.  SIGNS AND SYMPTOMS   Having fewer than three bowel movements a week.   Straining to have a bowel movement.   Having stools that are hard, dry, or larger than normal.   Feeling full or bloated.   Pain in the lower abdomen.   Not feeling relief after having a bowel movement.  DIAGNOSIS  Your health care provider will take a medical history and perform a physical exam. Further testing may be done for severe constipation. Some tests may include:  A barium enema X-ray to examine your rectum, colon, and, sometimes, your small intestine.   A sigmoidoscopy to examine your lower colon.   A colonoscopy to examine your entire colon. TREATMENT  Treatment will depend on the severity of your constipation and what is causing it. Some dietary treatments include drinking more fluids and eating more fiber-rich foods. Lifestyle treatments may include regular exercise. If these diet and  lifestyle recommendations do not help, your health care provider may recommend taking over-the-counter laxative medicines to help you have bowel movements. Prescription medicines may be prescribed if over-the-counter medicines do not work.  HOME CARE INSTRUCTIONS   Eat foods that have a lot of fiber, such as fruits, vegetables, whole grains, and beans.  Limit foods high in fat and processed sugars, such as french fries, hamburgers, cookies, candies, and soda.   A fiber supplement may be added to your diet if you cannot get enough fiber from foods.   Drink enough fluids to keep your urine clear or pale yellow.   Exercise regularly or as directed by your health care provider.   Go to the restroom when you have the urge to go. Do not hold it.   Only take over-the-counter or prescription medicines as directed by your health care provider. Do not take other medicines for constipation without talking to your health care provider first.  SEEK IMMEDIATE MEDICAL CARE IF:   You have bright red blood in your stool.   Your constipation lasts for more than 4 days or gets worse.   You have abdominal or rectal pain.   You have thin, pencil-like stools.   You have unexplained weight loss. MAKE SURE YOU:   Understand these instructions.  Will watch your condition.  Will get help right away if you are not doing well or get worse.   This information is not intended to replace advice given to you by your health care provider. Make sure you discuss any  questions you have with your health care provider.   Document Released: 10/06/2003 Document Revised: 01/28/2014 Document Reviewed: 10/19/2012 Elsevier Interactive Patient Education 2016 Elsevier Inc. High-Fiber Diet Fiber, also called dietary fiber, is a type of carbohydrate found in fruits, vegetables, whole grains, and beans. A high-fiber diet can have many health benefits. Your health care provider may recommend a high-fiber diet to  help:  Prevent constipation. Fiber can make your bowel movements more regular.  Lower your cholesterol.  Relieve hemorrhoids, uncomplicated diverticulosis, or irritable bowel syndrome.  Prevent overeating as part of a weight-loss plan.  Prevent heart disease, type 2 diabetes, and certain cancers. WHAT IS MY PLAN? The recommended daily intake of fiber includes:  38 grams for men under age 30.  30 grams for men over age 32.  25 grams for women under age 44.  21 grams for women over age 93. You can get the recommended daily intake of dietary fiber by eating a variety of fruits, vegetables, grains, and beans. Your health care provider may also recommend a fiber supplement if it is not possible to get enough fiber through your diet. WHAT DO I NEED TO KNOW ABOUT A HIGH-FIBER DIET?  Fiber supplements have not been widely studied for their effectiveness, so it is better to get fiber through food sources.  Always check the fiber content on thenutrition facts label of any prepackaged food. Look for foods that contain at least 5 grams of fiber per serving.  Ask your dietitian if you have questions about specific foods that are related to your condition, especially if those foods are not listed in the following section.  Increase your daily fiber consumption gradually. Increasing your intake of dietary fiber too quickly may cause bloating, cramping, or gas.  Drink plenty of water. Water helps you to digest fiber. WHAT FOODS CAN I EAT? Grains Whole-grain breads. Multigrain cereal. Oats and oatmeal. Brown rice. Barley. Bulgur wheat. Millet. Bran muffins. Popcorn. Rye wafer crackers. Vegetables Sweet potatoes. Spinach. Kale. Artichokes. Cabbage. Broccoli. Green peas. Carrots. Squash. Fruits Berries. Pears. Apples. Oranges. Avocados. Prunes and raisins. Dried figs. Meats and Other Protein Sources Navy, kidney, pinto, and soy beans. Split peas. Lentils. Nuts and  seeds. Dairy Fiber-fortified yogurt. Beverages Fiber-fortified soy milk. Fiber-fortified orange juice. Other Fiber bars. The items listed above may not be a complete list of recommended foods or beverages. Contact your dietitian for more options. WHAT FOODS ARE NOT RECOMMENDED? Grains White bread. Pasta made with refined flour. White rice. Vegetables Fried potatoes. Canned vegetables. Well-cooked vegetables.  Fruits Fruit juice. Cooked, strained fruit. Meats and Other Protein Sources Fatty cuts of meat. Fried Environmental education officer or fried fish. Dairy Milk. Yogurt. Cream cheese. Sour cream. Beverages Soft drinks. Other Cakes and pastries. Butter and oils. The items listed above may not be a complete list of foods and beverages to avoid. Contact your dietitian for more information. WHAT ARE SOME TIPS FOR INCLUDING HIGH-FIBER FOODS IN MY DIET?  Eat a wide variety of high-fiber foods.  Make sure that half of all grains consumed each day are whole grains.  Replace breads and cereals made from refined flour or white flour with whole-grain breads and cereals.  Replace white rice with brown rice, bulgur wheat, or millet.  Start the day with a breakfast that is high in fiber, such as a cereal that contains at least 5 grams of fiber per serving.  Use beans in place of meat in soups, salads, or pasta.  Eat high-fiber snacks, such as berries,  raw vegetables, nuts, or popcorn.   This information is not intended to replace advice given to you by your health care provider. Make sure you discuss any questions you have with your health care provider.   Document Released: 01/07/2005 Document Revised: 01/28/2014 Document Reviewed: 06/22/2013 Elsevier Interactive Patient Education [employer].

## 2015-02-18 NOTE — ED Notes (Signed)
EDP at bedside  

## 2015-02-18 NOTE — ED Provider Notes (Signed)
CSN: [id number]     Arrival date & time 02/18/15  0005 History   First MD Initiated Contact with Patient 02/18/15 0037     Chief Complaint  Patient presents with  . Constipation     (Consider location/radiation/quality/duration/timing/severity/associated sxs/prior Treatment) Patient is a 25 y.o. female presenting with constipation. The history is provided by the patient. No language interpreter was used.  Constipation Severity:  Moderate Time since last bowel movement:  4 days Timing:  Constant Progression:  Worsening Associated symptoms: back pain   Associated symptoms: no abdominal pain, no dysuria, no fever, no nausea and no vomiting   Associated symptoms comment:  Patient in with complaint of constipation with last bowel movement 4 days ago. She continues to pass gas. No abdominal pain or distention. No nausea or vomiting. She continues to have a normal diet. She feels like she needs to move her bowels but can't pass the stool. She became concerned when she started to see bright red blood with attempt. No melena. No urinary symptoms. She is 6 weeks post-partum from a full term vaginal delivery that was uncomplicated. She is nursing.    Past Medical History  Diagnosis Date  . Anxiety   . Panic attacks   . GERD (gastroesophageal reflux disease)   . Panic attacks   . Family history of adverse reaction to anesthesia   . Panic attack    Past Surgical History  Procedure Laterality Date  . No past surgeries    . Esophagogastroduodenoscopy (egd) with propofol N/A 01/11/2014    WUJ:[id number]/NFAO non-erosive  . Biopsy N/A 01/11/2014    Procedure: BIOPSY;  Surgeon: West Bali, MD;  Location: [facility];  Service: Endoscopy;  Laterality: N/A;  duodenal and gastric   Family History  Problem Relation Age of Onset  . Anxiety disorder Mother   . Heart disease Maternal Grandfather   . Colon cancer Other   . Cancer Maternal Grandmother     breast   Social History  Substance Use  Topics  . Smoking status: Never Smoker   . Smokeless tobacco: Never Used  . Alcohol Use: No     Comment: occasional   OB History    Gravida Para Term Preterm AB TAB SAB Ectopic Multiple Living   0 1     Review of Systems  Constitutional: Negative for fever.  Respiratory: Negative for shortness of breath.   Gastrointestinal: Positive for constipation and blood in stool. Negative for nausea, vomiting, abdominal pain and abdominal distention.  Genitourinary: Negative for dysuria.  Musculoskeletal: Positive for back pain.  Neurological: Negative for dizziness and syncope.      Allergies  Review of patient's allergies indicates no known allergies.  Home Medications   Prior to Admission medications   Medication Sig Start Date End Date Taking? Authorizing Provider  ibuprofen (ADVIL,MOTRIN) 600 MG tablet Take 1 tablet (600 mg total) by mouth every 6 (six) hours. Patient not taking: Reported on 02/14/2015 01/12/15   Federico Flake, MD  norethindrone (MICRONOR,CAMILA,ERRIN) 0.35 MG tablet 1 tablet by mouth at same time daily Patient not taking: Reported on 02/18/2015 02/14/15   Cheral Marker, CNM   BP 115/58 mmHg  Pulse 62  Temp(Src) 97.3 F (36.3 C) (Oral)  Resp 16  Wt 77.701 kg  SpO2 100%  LMP 02/12/2015 Physical Exam  Constitutional: She is oriented to person, place, and time. She appears well-developed and well-nourished.  Neck: Normal range of motion.  Pulmonary/Chest: Effort normal.  Abdominal: Soft. She exhibits no distension and no mass. There is no tenderness.  Genitourinary:  No visualized external hemorrhoid. No large fissures or anal blood. No impaction on rectal exam.   Neurological: She is alert and oriented to person, place, and time.  Skin: Skin is warm and dry.    ED Course  Procedures (including critical care time) Labs Review Labs Reviewed - No data to display  Imaging Review No results found. I have personally reviewed and  evaluated these images and lab results as part of my medical decision-making.   EKG Interpretation None      MDM   Final diagnoses:  None    1. Constipation  The patient's exam is essentially benign with history supporting uncomplicated constipation. Will recommend supportive measures and PCP follow up in 2-3 days if no relief.     Elpidio Anis, PA-C 02/18/15 0153  Elpidio Anis, PA-C 02/18/15 0154  Gilda Crease, MD 02/18/15 0201

## 2015-02-18 NOTE — ED Notes (Signed)
The [pt is c/o being constipation .  No bm since last weekend.  She has hemorrhoids and bleeds with any bms she has.    She is 6 weeks post partum  lmp 2 days ago

## 2015-05-01 ENCOUNTER — Encounter: Payer: Self-pay | Admitting: Obstetrics and Gynecology

## 2015-05-01 ENCOUNTER — Other Ambulatory Visit: Payer: Medicaid Other | Admitting: Obstetrics and Gynecology

## 2017-12-11 ENCOUNTER — Encounter: Payer: Self-pay | Admitting: Women's Health

## 2017-12-11 ENCOUNTER — Ambulatory Visit: Payer: Medicaid Other | Admitting: Women's Health

## 2017-12-11 VITALS — BP 122/70 | HR 89 | Ht 65.0 in | Wt 210.0 lb

## 2017-12-11 DIAGNOSIS — Z3009 Encounter for other general counseling and advice on contraception: Secondary | ICD-10-CM | POA: Diagnosis not present

## 2017-12-11 DIAGNOSIS — Z3202 Encounter for pregnancy test, result negative: Secondary | ICD-10-CM

## 2017-12-11 NOTE — Progress Notes (Signed)
   GYN VISIT Patient name: Lisa Christian MRN [id number]  Date of birth: 03-10-1990 Chief Complaint:   Contraception  History of Present Illness:   Lisa Christian is a 27 y.o. 461P1001 Caucasian female being seen today to discuss getting on birth control. Has been using condoms/withdrawal. Feels she is pregnant at time, feels something moving on the inside, HPT have all been neg. Reviewed all contraception options, wants IUD. Past due for pap. Has h/o abnormal pap prior to last one that was normal.      Patient's last menstrual period was 12/02/2017 (exact date). The current method of family planning is condoms  Last pap 04/25/14. Results were:  normal Review of Systems:   Pertinent items are noted in HPI Denies fever/chills, dizziness, headaches, visual disturbances, fatigue, shortness of breath, chest pain, abdominal pain, vomiting, abnormal vaginal discharge/itching/odor/irritation, problems with periods, bowel movements, urination, or intercourse unless otherwise stated above.  Pertinent History Reviewed:  Reviewed past medical,surgical, social, obstetrical and family history.  Reviewed problem list, medications and allergies. Physical Assessment:   Vitals:   12/11/17 1434  BP: 122/70  Pulse: 89  Weight: 210 lb (95.3 kg)  Height: 5\' 5"  (1.651 m)  Body mass index is 34.95 kg/m.       Physical Examination:   General appearance: alert, well appearing, and in no distress  Mental status: alert, oriented to person, place, and time  Skin: warm & dry   Cardiovascular: normal heart rate noted  Respiratory: normal respiratory effort, no distress  Abdomen: soft, non-tender   Pelvic: examination not indicated  Extremities: no edema   UPT neg  No results found for this or any previous visit (from the past 24 hour(s)).  Assessment & Plan:  1) Contraception counseling> wants IUD, needs pap 1st, Liletta brochure given  2) Feels pregnant> feels like something moving in belly, neg HPT, she  would feel better getting HCG  Meds: No orders of the defined types were placed in this encounter.   Orders Placed This Encounter  Procedures  . Beta hCG quant (ref lab)    Return for 1st available for , Pap & physical.  Cheral MarkerKimberly R Nyhla Mountjoy CNM, [facility] 12/11/2017 3:02 PM

## 2017-12-12 LAB — BETA HCG QUANT (REF LAB)

## 2018-02-02 ENCOUNTER — Other Ambulatory Visit (HOSPITAL_COMMUNITY)
Admission: RE | Admit: 2018-02-02 | Discharge: 2018-02-02 | Disposition: A | Discharge status: Home / Self Care | Payer: Medicaid Other | Source: Ambulatory Visit | Attending: Obstetrics & Gynecology | Admitting: Obstetrics & Gynecology

## 2018-02-02 ENCOUNTER — Encounter: Payer: Self-pay | Admitting: Women's Health

## 2018-02-02 ENCOUNTER — Ambulatory Visit: Payer: Medicaid Other | Admitting: Women's Health

## 2018-02-02 VITALS — BP 104/69 | HR 81 | Ht 66.0 in | Wt 207.0 lb

## 2018-02-02 DIAGNOSIS — Z0000 Encounter for general adult medical examination without abnormal findings: Secondary | ICD-10-CM | POA: Diagnosis not present

## 2018-02-02 DIAGNOSIS — Z01419 Encounter for gynecological examination (general) (routine) without abnormal findings: Secondary | ICD-10-CM | POA: Diagnosis present

## 2018-02-02 DIAGNOSIS — Z113 Encounter for screening for infections with a predominantly sexual mode of transmission: Secondary | ICD-10-CM

## 2018-02-02 DIAGNOSIS — Z3009 Encounter for other general counseling and advice on contraception: Secondary | ICD-10-CM | POA: Diagnosis not present

## 2018-02-02 NOTE — Patient Instructions (Signed)
NO SEX UNTIL AFTER YOU GET YOUR BIRTH CONTROL  

## 2018-02-02 NOTE — Progress Notes (Signed)
   WELL-WOMAN EXAMINATION Patient name: Lisa Christian MRN [id number]  Date of birth: [birth year] Chief Complaint:   Gynecologic Exam (pap/physcial)  History of Present Illness:   Lisa Christian is a 28 y.o. G7P1001 Caucasian female being seen today for a routine well-woman exam.  Current complaints: none, wants IUD  PCP: none      does desire labs Patient's last menstrual period was 01/27/2018. The current method of family planning is abstinence Last pap 04/25/14. Results were: normal Last mammogram: never. Results were: n/a. Family h/o breast cancer: Yes, [name] in 81s Last colonoscopy: never. Results were: n/a. Family h/o colorectal cancer: Yes, MGGF Review of Systems:   Pertinent items are noted in HPI Denies any headaches, blurred vision, fatigue, shortness of breath, chest pain, abdominal pain, abnormal vaginal discharge/itching/odor/irritation, problems with periods, bowel movements, urination, or intercourse unless otherwise stated above. Pertinent History Reviewed:  Reviewed past medical,surgical, social and family history.  Reviewed problem list, medications and allergies. Physical Assessment:   Vitals:   02/02/18 1440  BP: 104/69  Pulse: 81  Weight: 207 lb (93.9 kg)  Height: 5\' 6"  (1.676 m)  Body mass index is 33.41 kg/m.        Physical Examination:   General appearance - well appearing, and in no distress  Mental status - alert, oriented to person, place, and time  Psych:  She has a normal mood and affect  Skin - warm and dry, normal color, no suspicious lesions noted  Chest - effort normal, all lung fields clear to auscultation bilaterally  Heart - normal rate and regular rhythm  Neck:  midline trachea, no thyromegaly or nodules  Breasts - breasts appear normal, no suspicious masses, no skin or nipple changes or  axillary nodes  Abdomen - soft, nontender, nondistended, no masses or organomegaly  Pelvic - VULVA: normal appearing vulva with no masses, tenderness or  lesions  VAGINA: normal appearing vagina with normal color and discharge, no lesions  CERVIX: normal appearing cervix without discharge or lesions, no CMT  Thin prep pap is done w/ reflex HR HPV cotesting  UTERUS: uterus is felt to be normal size, shape, consistency and nontender   ADNEXA: No adnexal masses or tenderness noted.  Extremities:  No swelling or varicosities noted  No results found for this or any previous visit (from the past 24 hour(s)).  Assessment & Plan:  1) Well-Woman Exam  2) STD screen  3) Contraception counseling> wants Liletta IUD, abstinence until after insertion- will schedule for when she will be on period  Labs/procedures today: as below  Mammogram @28yo  or sooner if problems Colonoscopy @28yo  or sooner if problems  Orders Placed This Encounter  Procedures  . CBC  . Comprehensive metabolic panel  . TSH  . HIV Antibody (routine testing w rflx)  . RPR    Follow-up: Return for 2/10 for IUD insertion.  Cheral Marker CNM, [facility] 02/02/2018 3:26 PM

## 2018-02-02 NOTE — Addendum Note (Signed)
Addended by: Federico Flake A on: 02/02/2018 03:52 PM   Modules accepted: Orders

## 2018-02-03 LAB — CBC
HEMOGLOBIN: 13.2 g/dL (ref 11.1–15.9)
Hematocrit: 38.5 % (ref 34.0–46.6)
MCH: 31 pg (ref 26.6–33.0)
MCHC: 34.3 g/dL (ref 31.5–35.7)
MCV: 90 fL (ref 79–97)
Platelets: 301 10*3/uL (ref 150–450)
RBC: 4.26 x10E6/uL (ref 3.77–5.28)
RDW: 12.3 % (ref 11.7–15.4)
WBC: 8.2 10*3/uL (ref 3.4–10.8)

## 2018-02-03 LAB — COMPREHENSIVE METABOLIC PANEL
ALK PHOS: 63 IU/L (ref 39–117)
ALT: 17 IU/L (ref 0–32)
AST: 18 IU/L (ref 0–40)
Albumin/Globulin Ratio: 1.7 (ref 1.2–2.2)
Albumin: 4.4 g/dL (ref 3.5–5.5)
BILIRUBIN TOTAL: 0.4 mg/dL (ref 0.0–1.2)
BUN / CREAT RATIO: 9 (ref 9–23)
BUN: 6 mg/dL (ref 6–20)
CO2: 20 mmol/L (ref 20–29)
CREATININE: 0.7 mg/dL (ref 0.57–1.00)
Calcium: 9 mg/dL (ref 8.7–10.2)
Chloride: 102 mmol/L (ref 96–106)
GFR calc non Af Amer: 119 mL/min/{1.73_m2} (ref >59)
GFR, EST AFRICAN AMERICAN: 137 mL/min/{1.73_m2} (ref >59)
GLOBULIN, TOTAL: 2.6 g/dL (ref 1.5–4.5)
GLUCOSE: 102 mg/dL — AB (ref 65–99)
Potassium: 4.1 mmol/L (ref 3.5–5.2)
SODIUM: 138 mmol/L (ref 134–144)
TOTAL PROTEIN: 7 g/dL (ref 6.0–8.5)

## 2018-02-03 LAB — RPR: RPR Ser Ql: NONREACTIVE

## 2018-02-03 LAB — TSH: TSH: 3.1 u[IU]/mL (ref 0.450–4.500)

## 2018-02-03 LAB — HIV ANTIBODY (ROUTINE TESTING W REFLEX): HIV Screen 4th Generation wRfx: NONREACTIVE

## 2018-02-04 LAB — CYTOLOGY - PAP
Chlamydia: NEGATIVE
Diagnosis: NEGATIVE
Neisseria Gonorrhea: NEGATIVE

## 2018-02-24 ENCOUNTER — Ambulatory Visit (INDEPENDENT_AMBULATORY_CARE_PROVIDER_SITE_OTHER): Payer: Medicaid Other | Admitting: Women's Health

## 2018-02-24 ENCOUNTER — Encounter: Payer: Self-pay | Admitting: Women's Health

## 2018-02-24 VITALS — BP 110/61 | HR 71 | Ht 65.0 in | Wt 205.5 lb

## 2018-02-24 DIAGNOSIS — Z30430 Encounter for insertion of intrauterine contraceptive device: Secondary | ICD-10-CM

## 2018-02-24 DIAGNOSIS — Z3202 Encounter for pregnancy test, result negative: Secondary | ICD-10-CM

## 2018-02-24 LAB — POCT URINE PREGNANCY: Preg Test, Ur: NEGATIVE

## 2018-02-24 MED ORDER — LEVONORGESTREL 19.5 MCG/DAY IU IUD
INTRAUTERINE_SYSTEM | Freq: Once | INTRAUTERINE | Status: AC
  Administered 2018-02-24: 1 via INTRAUTERINE

## 2018-02-24 NOTE — Progress Notes (Signed)
   IUD INSERTION Patient name: Lisa Christian MRN [id number]  Date of birth: 1990-05-19 Subjective Findings:   NEA SCHNIEDER is a 28 y.o. G27P1001 Caucasian female being seen today for insertion of a Liletta IUD.   Patient's last menstrual period was 02/22/2018 (exact date). Last sexual intercourse was >2wks ago Last pap1/13/20. Results were:  normal  The risks and benefits of the method and placement have been thouroughly reviewed with the patient and all questions were answered.  Specifically the patient is aware of failure rate of 01/998, expulsion of the IUD and of possible perforation.  The patient is aware of irregular bleeding due to the method and understands the incidence of irregular bleeding diminishes with time.  Signed copy of informed consent in chart.  Pertinent History Reviewed:   Reviewed past medical,surgical, social, obstetrical and family history.  Reviewed problem list, medications and allergies. Objective Findings & Procedure:   Vitals:   02/24/18 1418  BP: 110/61  Pulse: 71  Weight: 205 lb 8 oz (93.2 kg)  Height: 5\' 5"  (1.651 m)  Body mass index is 34.2 kg/m.  Results for orders placed or performed in visit on 02/24/18 (from the past 24 hour(s))  POCT urine pregnancy   Collection Time: 02/24/18  2:20 PM  Result Value Ref Range   Preg Test, Ur Negative Negative     Time out was performed.  A graves speculum was placed in the vagina.  The cervix was visualized, prepped using Betadine, and grasped with a single tooth tenaculum. The uterus was found to be neutral and it sounded to 9 cm.  Liletta IUD placed per manufacturer's recommendations. The strings were trimmed to approximately 3 cm. The patient tolerated the procedure well.   Informal transvaginal sonogram was performed and the proper placement of the IUD was verified. Assessment & Plan:   1) Liletta IUD insertion The patient was given post procedure instructions, including signs and symptoms of  infection and to check for the strings after each menses or each month, and refraining from intercourse or anything in the vagina for 3 days. She was given a Liletta care card with date IUD placed, and date IUD to be removed. She is scheduled for a f/u appointment in 4 weeks.  Orders Placed This Encounter  Procedures  . POCT urine pregnancy    Return in about 4 weeks (around 03/24/2018) for F/U.  Cheral Marker CNM, [facility] 02/24/2018 3:06 PM

## 2018-02-24 NOTE — Patient Instructions (Addendum)
 Nothing in vagina for 3 days (no sex, douching, tampons, etc...)  Check your strings once a month to make sure you can feel them, if you are not able to please let us know  If you develop a fever of 100.4 or more in the next few weeks, or if you develop severe abdominal pain, please let us know  Use a backup method of birth control, such as condoms, for 2 weeks    Intrauterine Device Insertion, Care After  This sheet gives you information about how to care for yourself after your procedure. Your health care provider may also give you more specific instructions. If you have problems or questions, contact your health care provider. What can I expect after the procedure? After the procedure, it is common to have:  Cramps and pain in the abdomen.  Light bleeding (spotting) or heavier bleeding that is like your menstrual period. This may last for up to a few days.  Lower back pain.  Dizziness.  Headaches.  Nausea. Follow these instructions at home:  Before resuming sexual activity, check to make sure that you can feel the IUD string(s). You should be able to feel the end of the string(s) below the opening of your cervix. If your IUD string is in place, you may resume sexual activity. ? If you had a hormonal IUD inserted more than 7 days after your most recent period started, you will need to use a backup method of birth control for 7 days after IUD insertion. Ask your health care provider whether this applies to you.  Continue to check that the IUD is still in place by feeling for the string(s) after every menstrual period, or once a month.  Take over-the-counter and prescription medicines only as told by your health care provider.  Do not drive or use heavy machinery while taking prescription pain medicine.  Keep all follow-up visits as told by your health care provider. This is important. Contact a health care provider if:  You have bleeding that is heavier or lasts longer than  a normal menstrual cycle.  You have a fever.  You have cramps or abdominal pain that get worse or do not get better with medicine.  You develop abdominal pain that is new or is not in the same area of earlier cramping and pain.  You feel lightheaded or weak.  You have abnormal or bad-smelling discharge from your vagina.  You have pain during sexual activity.  You have any of the following problems with your IUD string(s): ? The string bothers or hurts you or your sexual partner. ? You cannot feel the string. ? The string has gotten longer.  You can feel the IUD in your vagina.  You think you may be pregnant, or you miss your menstrual period.  You think you may have an STI (sexually transmitted infection). Get help right away if:  You have flu-like symptoms.  You have a fever and chills.  You can feel that your IUD has slipped out of place. Summary  After the procedure, it is common to have cramps and pain in the abdomen. It is also common to have light bleeding (spotting) or heavier bleeding that is like your menstrual period.  Continue to check that the IUD is still in place by feeling for the string(s) after every menstrual period, or once a month.  Keep all follow-up visits as told by your health care provider. This is important.  Contact your health care provider if   you have problems with your IUD string(s), such as the string getting longer or bothering you or your sexual partner. This information is not intended to replace advice given to you by your health care provider. Make sure you discuss any questions you have with your health care provider. Document Released: 09/05/2010 Document Revised: 11/29/2015 Document Reviewed: 11/29/2015 Elsevier Interactive Patient Education  2019 Elsevier Inc.  Levonorgestrel intrauterine device (IUD) What is this medicine? LEVONORGESTREL IUD (LEE voe nor jes trel) is a contraceptive (birth control) device. The device is placed  inside the uterus by a healthcare professional. It is used to prevent pregnancy. This device can also be used to treat heavy bleeding that occurs during your period. This medicine may be used for other purposes; ask your health care provider or pharmacist if you have questions. COMMON BRAND NAME(S): Kyleena, LILETTA, Mirena, Skyla What should I tell my health care provider before I take this medicine? They need to know if you have any of these conditions: -abnormal Pap smear -cancer of the breast, uterus, or cervix -diabetes -endometritis -genital or pelvic infection now or in the past -have more than one sexual partner or your partner has more than one partner -heart disease -history of an ectopic or tubal pregnancy -immune system problems -IUD in place -liver disease or tumor -problems with blood clots or take blood-thinners -seizures -use intravenous drugs -uterus of unusual shape -vaginal bleeding that has not been explained -an unusual or allergic reaction to levonorgestrel, other hormones, silicone, or polyethylene, medicines, foods, dyes, or preservatives -pregnant or trying to get pregnant -breast-feeding How should I use this medicine? This device is placed inside the uterus by a health care professional. Talk to your pediatrician regarding the use of this medicine in children. Special care may be needed. Overdosage: If you think you have taken too much of this medicine contact a poison control center or emergency room at once. NOTE: This medicine is only for you. Do not share this medicine with others. What if I miss a dose? This does not apply. Depending on the brand of device you have inserted, the device will need to be replaced every 3 to 5 years if you wish to continue using this type of birth control. What may interact with this medicine? Do not take this medicine with any of the following medications: -amprenavir -bosentan -fosamprenavir This medicine may also  interact with the following medications: -aprepitant -armodafinil -barbiturate medicines for inducing sleep or treating seizures -bexarotene -boceprevir -griseofulvin -medicines to treat seizures like carbamazepine, ethotoin, felbamate, oxcarbazepine, phenytoin, topiramate -modafinil -pioglitazone -rifabutin -rifampin -rifapentine -some medicines to treat HIV infection like atazanavir, efavirenz, indinavir, lopinavir, nelfinavir, tipranavir, ritonavir -St. John's wort -warfarin This list may not describe all possible interactions. Give your health care provider a list of all the medicines, herbs, non-prescription drugs, or dietary supplements you use. Also tell them if you smoke, drink alcohol, or use illegal drugs. Some items may interact with your medicine. What should I watch for while using this medicine? Visit your doctor or health care professional for regular check ups. See your doctor if you or your partner has sexual contact with others, becomes HIV positive, or gets a sexual transmitted disease. This product does not protect you against HIV infection (AIDS) or other sexually transmitted diseases. You can check the placement of the IUD yourself by reaching up to the top of your vagina with clean fingers to feel the threads. Do not pull on the threads. It is a good habit   to check placement after each menstrual period. Call your doctor right away if you feel more of the IUD than just the threads or if you cannot feel the threads at all. The IUD may come out by itself. You may become pregnant if the device comes out. If you notice that the IUD has come out use a backup birth control method like condoms and call your health care provider. Using tampons will not change the position of the IUD and are okay to use during your period. This IUD can be safely scanned with magnetic resonance imaging (MRI) only under specific conditions. Before you have an MRI, tell your healthcare provider that  you have an IUD in place, and which type of IUD you have in place. What side effects may I notice from receiving this medicine? Side effects that you should report to your doctor or health care professional as soon as possible: -allergic reactions like skin rash, itching or hives, swelling of the face, lips, or tongue -fever, flu-like symptoms -genital sores -high blood pressure -no menstrual period for 6 weeks during use -pain, swelling, warmth in the leg -pelvic pain or tenderness -severe or sudden headache -signs of pregnancy -stomach cramping -sudden shortness of breath -trouble with balance, talking, or walking -unusual vaginal bleeding, discharge -yellowing of the eyes or skin Side effects that usually do not require medical attention (report to your doctor or health care professional if they continue or are bothersome): -acne -breast pain -change in sex drive or performance -changes in weight -cramping, dizziness, or faintness while the device is being inserted -headache -irregular menstrual bleeding within first 3 to 6 months of use -nausea This list may not describe all possible side effects. Call your doctor for medical advice about side effects. You may report side effects to FDA at 1-800-FDA-1088. Where should I keep my medicine? This does not apply. NOTE: This sheet is a summary. It may not cover all possible information. If you have questions about this medicine, talk to your doctor, pharmacist, or health care provider.  2019 Elsevier/Gold Standard (2015-10-20 14:14:56)  

## 2018-03-02 ENCOUNTER — Ambulatory Visit: Payer: Medicaid Other | Admitting: Women's Health

## 2018-03-24 ENCOUNTER — Encounter: Payer: Self-pay | Admitting: Women's Health

## 2018-03-24 ENCOUNTER — Ambulatory Visit (INDEPENDENT_AMBULATORY_CARE_PROVIDER_SITE_OTHER): Payer: Medicaid Other | Admitting: Women's Health

## 2018-03-24 VITALS — BP 92/60 | HR 67 | Ht 66.0 in | Wt 207.0 lb

## 2018-03-24 DIAGNOSIS — Z30431 Encounter for routine checking of intrauterine contraceptive device: Secondary | ICD-10-CM | POA: Diagnosis not present

## 2018-03-24 NOTE — Progress Notes (Signed)
   GYN VISIT Patient name: Lisa Christian MRN [id number]  Date of birth: [birth year] Chief Complaint:   iud check  History of Present Illness:   Lisa Christian is a 28 y.o. G71P1001 Caucasian female being seen today for IUD check.  Liletta IUD inserted 02/24/18. No problems, thinks she was able to feel strings.   Patient's last menstrual period was 02/22/2018 (exact date). The current method of family planning is IUD. Last pap 02/02/18. Results were:  normal Review of Systems:   Pertinent items are noted in HPI Denies fever/chills, dizziness, headaches, visual disturbances, fatigue, shortness of breath, chest pain, abdominal pain, vomiting, abnormal vaginal discharge/itching/odor/irritation, problems with periods, bowel movements, urination, or intercourse unless otherwise stated above.  Pertinent History Reviewed:  Reviewed past medical,surgical, social, obstetrical and family history.  Reviewed problem list, medications and allergies. Physical Assessment:   Vitals:   03/24/18 1535  BP: 92/60  Pulse: 67  Weight: 207 lb (93.9 kg)  Height: 5\' 6"  (1.676 m)  Body mass index is 33.41 kg/m.       Physical Examination:   General appearance: alert, well appearing, and in no distress  Mental status: alert, oriented to person, place, and time  Skin: warm & dry   Cardiovascular: normal heart rate noted  Respiratory: normal respiratory effort, no distress  Abdomen: soft, non-tender   Pelvic: VULVA: normal appearing vulva with no masses, tenderness or lesions, CERVIX: normal appearing cervix without discharge or lesions, IUD strings visible, tucked behind cx @ 9 o'clock Informal transvaginal u/s: IUD in correct fundal placement w/in endometrium  Extremities: no edema   No results found for this or any previous visit (from the past 24 hour(s)).  Assessment & Plan:  1) IUD check> in place  Meds: No orders of the defined types were placed in this encounter.   No orders of the defined types  were placed in this encounter.   Return for after 02/03/19 for physical.  Cheral Marker CNM, [facility] 03/24/2018 4:00 PM

## 2018-09-24 ENCOUNTER — Other Ambulatory Visit: Payer: Self-pay

## 2018-09-24 ENCOUNTER — Encounter: Payer: Self-pay | Admitting: Women's Health

## 2018-09-24 ENCOUNTER — Ambulatory Visit (INDEPENDENT_AMBULATORY_CARE_PROVIDER_SITE_OTHER): Payer: Medicaid Other | Admitting: Women's Health

## 2018-09-24 VITALS — BP 103/72 | HR 83 | Ht 66.0 in | Wt 201.5 lb

## 2018-09-24 DIAGNOSIS — N898 Other specified noninflammatory disorders of vagina: Secondary | ICD-10-CM | POA: Diagnosis not present

## 2018-09-24 NOTE — Progress Notes (Signed)
   GYN VISIT Patient name: Lisa Christian MRN [id number]  Date of birth: 1990/03/06 Chief Complaint:   IUD check  History of Present Illness:   Lisa Christian is a 28 y.o. G73P1001 Caucasian female being seen today for bump on cervix when checking IUD strings. Some vaginal d/c w/ odor occasionally.   Patient's last menstrual period was 09/17/2018. The current method of family planning is IUD. Liletta IUD placed 02/24/18 Last pap 02/02/18. Results were:  normal Review of Systems:   Pertinent items are noted in HPI Denies fever/chills, dizziness, headaches, visual disturbances, fatigue, shortness of breath, chest pain, abdominal pain, vomiting, abnormal vaginal discharge/itching/odor/irritation, problems with periods, bowel movements, urination, or intercourse unless otherwise stated above.  Pertinent History Reviewed:  Reviewed past medical,surgical, social, obstetrical and family history.  Reviewed problem list, medications and allergies. Physical Assessment:   Vitals:   09/24/18 1531  BP: 103/72  Pulse: 83  Weight: 201 lb 8 oz (91.4 kg)  Height: 5\' 6"  (1.676 m)  Body mass index is 32.52 kg/m.       Physical Examination:   General appearance: alert, well appearing, and in no distress  Mental status: alert, oriented to person, place, and time  Skin: warm & dry   Cardiovascular: normal heart rate noted  Respiratory: normal respiratory effort, no distress  Abdomen: soft, non-tender   Pelvic: VULVA: normal appearing vulva with no masses, tenderness or lesions, VAGINA: normal appearing vagina with normal color and discharge, no lesions, CERVIX: normal appearing cervix without discharge or lesions, IUD strings visible, no visible bump on cervix, cx feels normal to palpation as well Extremities: no edema   No results found for this or any previous visit (from the past 24 hour(s)).  Assessment & Plan:  1) Normal cx  2) Occ vaginal d/c w/ odor> nuswab sent  Meds: No orders of the  defined types were placed in this encounter.   Orders Placed This Encounter  Procedures  . NuSwab Vaginitis Plus (VG+)    Return for after 1/13 for , Physical.  Roma Schanz CNM, [facility] 09/24/2018 4:08 PM

## 2018-09-29 ENCOUNTER — Other Ambulatory Visit: Payer: Self-pay | Admitting: Women's Health

## 2018-09-29 LAB — NUSWAB VAGINITIS PLUS (VG+)
Candida albicans, NAA: POSITIVE — AB
Candida glabrata, NAA: NEGATIVE
Chlamydia trachomatis, NAA: NEGATIVE
Neisseria gonorrhoeae, NAA: NEGATIVE
Trich vag by NAA: NEGATIVE

## 2018-09-29 MED ORDER — FLUCONAZOLE 150 MG PO TABS: 150.0000 mg | ORAL_TABLET | Freq: Once | ORAL | 0 refills | Status: AC

## 2019-02-22 ENCOUNTER — Encounter (HOSPITAL_COMMUNITY): Payer: Self-pay | Admitting: Emergency Medicine

## 2019-02-22 ENCOUNTER — Other Ambulatory Visit: Payer: Self-pay

## 2019-02-22 ENCOUNTER — Emergency Department (HOSPITAL_COMMUNITY)
Admission: EM | Admit: 2019-02-22 | Discharge: 2019-02-23 | Disposition: A | Discharge status: Home / Self Care | Payer: Medicaid Other | Attending: Emergency Medicine | Admitting: Emergency Medicine

## 2019-02-22 DIAGNOSIS — Y9389 Activity, other specified: Secondary | ICD-10-CM | POA: Insufficient documentation

## 2019-02-22 DIAGNOSIS — Y999 Unspecified external cause status: Secondary | ICD-10-CM | POA: Diagnosis not present

## 2019-02-22 DIAGNOSIS — W1839XA Other fall on same level, initial encounter: Secondary | ICD-10-CM | POA: Diagnosis not present

## 2019-02-22 DIAGNOSIS — Y929 Unspecified place or not applicable: Secondary | ICD-10-CM | POA: Insufficient documentation

## 2019-02-22 DIAGNOSIS — M545 Low back pain: Secondary | ICD-10-CM | POA: Diagnosis present

## 2019-02-22 DIAGNOSIS — M5416 Radiculopathy, lumbar region: Secondary | ICD-10-CM | POA: Diagnosis not present

## 2019-02-22 NOTE — ED Triage Notes (Signed)
Patient reports chronic low back pain since November last year worse this week unrelieved by prescription Baclofen and Prednisone , denies injury , no hematuria or dysuria .

## 2019-02-23 ENCOUNTER — Emergency Department (HOSPITAL_COMMUNITY): Payer: Medicaid Other

## 2019-02-23 MED ORDER — HYDROCODONE-ACETAMINOPHEN 5-325 MG PO TABS: 1.0000 | ORAL_TABLET | ORAL | 0 refills | Status: DC | PRN

## 2019-02-23 MED ORDER — ONDANSETRON HCL 4 MG/2ML IJ SOLN
4.0000 mg | Freq: Once | INTRAMUSCULAR | Status: AC
  Administered 2019-02-23: 04:00:00 4 mg via INTRAVENOUS
  Filled 2019-02-23: qty 2

## 2019-02-23 MED ORDER — HYDROMORPHONE HCL 1 MG/ML IJ SOLN
1.0000 mg | Freq: Once | INTRAMUSCULAR | Status: AC
  Administered 2019-02-23: 04:00:00 1 mg via INTRAVENOUS
  Filled 2019-02-23: qty 1

## 2019-02-23 MED ORDER — PREDNISONE 20 MG PO TABS: ORAL_TABLET | ORAL | 0 refills | Status: DC

## 2019-02-23 MED ORDER — KETOROLAC TROMETHAMINE 30 MG/ML IJ SOLN
15.0000 mg | Freq: Once | INTRAMUSCULAR | Status: AC
  Administered 2019-02-23: 04:00:00 15 mg via INTRAVENOUS
  Filled 2019-02-23: qty 1

## 2019-02-23 MED ORDER — DEXAMETHASONE SODIUM PHOSPHATE 10 MG/ML IJ SOLN
10.0000 mg | Freq: Once | INTRAMUSCULAR | Status: AC
  Administered 2019-02-23: 10 mg via INTRAVENOUS
  Filled 2019-02-23: qty 1

## 2019-02-23 NOTE — ED Provider Notes (Signed)
[facility] EMERGENCY DEPARTMENT Provider Note   CSN: [id number] Arrival date & time: 02/22/19  2042     History Chief Complaint  Patient presents with  . Back Pain    Lisa Christian is a 29 y.o. female.  Patient presents to the emergency department for evaluation of back pain.  Patient reports that she has been experiencing severe and progressive back pain since November.  Patient denies any specific injury.  She has been having low back pain with radiation to the left leg and numbness of the left leg.  She was seen at [facility] for this approximately a month ago.  She was told she would get an MRI but this has not happened.  She has been referred to physical therapy and has had 2 sessions.  Pain is significantly worsened over the last couple of days.  She has now experiencing pain radiating down the right leg which she has not had previously.  She had a fall tonight because of the pain and could not get up on her own.  No loss of bowel or bladder function.        Past Medical History:  Diagnosis Date  . Anxiety   . Family history of adverse reaction to anesthesia   . GERD (gastroesophageal reflux disease)   . Panic attack   . Panic attacks   . Panic attacks     Patient Active Problem List   Diagnosis Date Noted  . Encounter for IUD insertion 02/24/2018  . Shoulder dystocia during labor and delivery, delivered 02/14/2015  . CIN I (cervical intraepithelial neoplasia I) 04/25/2014  . GERD (gastroesophageal reflux disease) 11/24/2013  . 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 Regurgitation 11/24/2013    Past Surgical History:  Procedure Laterality Date  . BIOPSY N/A 01/11/2014   Procedure: BIOPSY;  Surgeon: West Bali, MD;  Location: [facility];  Service: Endoscopy;  Laterality: N/A;  duodenal and gastric  . ESOPHAGOGASTRODUODENOSCOPY (EGD) WITH PROPOFOL N/A 01/11/2014   SAY:[id number] non-erosive  . NO PAST SURGERIES       OB History    Gravida  1     Para  1   Term  1   Preterm      AB      Living  1     SAB      TAB      Ectopic      Multiple  0   Live Births  1           Family History  Problem Relation Age of Onset  . Anxiety disorder Mother   . Heart disease Maternal Grandfather   . Cancer Maternal Grandmother        breast  . Colon cancer Other     Social History   Tobacco Use  . Smoking status: Never Smoker  . Smokeless tobacco: Never Used  Substance Use Topics  . Alcohol use: No    Comment: occasional  . Drug use: No    Home Medications Prior to Admission medications   Medication Sig Start Date End Date Taking? Authorizing Provider  HYDROcodone-acetaminophen (NORCO/VICODIN) 5-325 MG tablet Take 1 tablet by mouth every 4 (four) hours as needed for moderate pain. 02/23/19   Gilda Crease, MD  levonorgestrel (LILETTA, 52 MG,) 19.5 MCG/DAY IUD IUD 1 each by Intrauterine route once.    [provider]  predniSONE (DELTASONE) 20 MG tablet 3 tabs po daily x 3 days, then 2 tabs x  3 days, then 1.5 tabs x 3 days, then 1 tab x 3 days, then 0.5 tabs x 3 days 02/23/19   Gilda Crease, MD    Allergies    Patient has no known allergies.  Review of Systems   Review of Systems  Musculoskeletal: Positive for back pain.  Neurological: Positive for numbness.  All other systems reviewed and are negative.   Physical Exam Updated Vital Signs BP 123/86 (BP Location: Right Arm)   Pulse 73   Temp 98.1 F (36.7 C) (Oral)   Resp 14   Ht 5\' 6"  (1.676 m)   Wt 86.2 kg   SpO2 100%   BMI 30.67 kg/m   Physical Exam Vitals and nursing note reviewed.  Constitutional:      General: She is not in acute distress.    Appearance: Normal appearance. She is well-developed.  HENT:     Head: Normocephalic and atraumatic.     Right Ear: Hearing normal.     Left Ear: Hearing normal.     Nose: Nose normal.  Eyes:     Conjunctiva/sclera: Conjunctivae normal.     Pupils: Pupils are equal,  round, and reactive to light.  Cardiovascular:     Rate and Rhythm: Regular rhythm.     Heart sounds: S1 normal and S2 normal. No murmur. No friction rub. No gallop.   Pulmonary:     Effort: Pulmonary effort is normal. No respiratory distress.     Breath sounds: Normal breath sounds.  Chest:     Chest wall: No tenderness.  Abdominal:     General: Bowel sounds are normal.     Palpations: Abdomen is soft.     Tenderness: There is no abdominal tenderness. There is no guarding or rebound. Negative signs include Murphy's sign and McBurney's sign.     Hernia: No hernia is present.  Musculoskeletal:        General: Normal range of motion.     Cervical back: Normal range of motion and neck supple.  Skin:    General: Skin is warm and dry.     Findings: No rash.  Neurological:     Mental Status: She is alert and oriented to person, place, and time.     GCS: GCS eye subscore is 4. GCS verbal subscore is 5. GCS motor subscore is 6.     Cranial Nerves: No cranial nerve deficit.     Sensory: No sensory deficit.     Coordination: Coordination normal.     Deep Tendon Reflexes:     Reflex Scores:      Patellar reflexes are 3+ on the right side and 3+ on the left side.    Comments: No footdrop, no saddle anesthesia  Bilateral +SLR  Psychiatric:        Speech: Speech normal.        Behavior: Behavior normal.        Thought Content: Thought content normal.     ED Results / Procedures / Treatments   Labs (all labs ordered are listed, but only abnormal results are displayed) Labs Reviewed - No data to display  EKG None  Radiology MR LUMBAR SPINE WO CONTRAST  Result Date: 02/23/2019 CLINICAL DATA:  Progressive low back pain for for over 6 weeks with numbness EXAM: MRI LUMBAR SPINE WITHOUT CONTRAST TECHNIQUE: Multiplanar, multisequence MR imaging of the lumbar spine was performed. No intravenous contrast was administered. COMPARISON:  None. FINDINGS: Segmentation: Transitional S1 vertebra  with rudimentary S1-2 disc  space. Alignment:  Normal Vertebrae:  No fracture, evidence of discitis, or bone lesion. Conus medullaris and cauda equina: Conus extends to the L1 level. Conus and cauda equina appear normal. Paraspinal and other soft tissues: Negative Disc levels: T12- L1: Unremarkable. L1-L2: Unremarkable. L2-L3: Unremarkable. L3-L4: Unremarkable. L4-L5: Disc narrowing and desiccation with left eccentric and inferior pointing disc extrusion causing high-grade spinal stenosis and asymmetric compression of the descending left L5 nerve root. The foramina are patent. L5-S1:Disc narrowing and mild endplate degeneration with desiccation. Right eccentric protrusion which contacts the descending S1 nerve root. The foramina are patent. IMPRESSION: 1. L4-5 left eccentric extrusion with high-grade/compressive spinal stenosis. 2. L5-S1 right eccentric protrusion contacting the right S1 nerve root. Electronically Signed   By: Marnee Spring M.D.   On: 02/23/2019 05:03    Procedures Procedures (including critical care time)  Medications Ordered in ED Medications  dexamethasone (DECADRON) injection 10 mg (has no administration in time range)  HYDROmorphone (DILAUDID) injection 1 mg (1 mg Intravenous Given 02/23/19 0359)  ondansetron (ZOFRAN) injection 4 mg (4 mg Intravenous Given 02/23/19 0359)  ketorolac (TORADOL) 30 MG/ML injection 15 mg (15 mg Intravenous Given 02/23/19 0359)    ED Course  I have reviewed the triage vital signs and the nursing notes.  Pertinent labs & imaging results that were available during my care of the patient were reviewed by me and considered in my medical decision making (see chart for details).    MDM Rules/Calculators/A&P                      Patient presents to the emergency department for evaluation of low back pain.  Patient reports that she has always had some problems with her back ever since she was 29 years old, however started having severe pain around  Thanksgiving of this past year.  Pain has progressively worsened.  She is feeling pain all the way down her left leg with numbness of the left leg and now over the last couple of days started having radiation into the right leg.  Patient reports that her doctor referred her to Dr. Shon Baton.  She did not see Dr. Shon Baton but saw the PA.  Patient reports that an MRI was to be scheduled but that has not happened.  She is was started at physical therapy in the last week or so and reports that the pain has significantly worsened since starting therapy.  Examination does not reveal any red flags which she does appear to be in a great deal of pain and does have bilateral straight leg raise pain.  No foot drop or saddle anesthesia.  An MRI was performed and does show  L4-L5: Disc narrowing and desiccation with left eccentric and  inferior pointing disc extrusion causing high-grade spinal stenosis  and asymmetric compression of the descending left L5 nerve root. The  foramina are patent.   L5-S1:Disc narrowing and mild endplate degeneration with  desiccation. Right eccentric protrusion which contacts the  descending S1 nerve root. The foramina are patent  Findings discussed with the patient.  I do not believe she requires urgent or emergent surgery, but does require follow-up with a spinal surgeon.  Patient states that she would like to be referred to a different practice, does not want to be seen at [facility].  Will refer to on-call neurosurgery.  Will provide analgesia and restart prednisone taper.  Final Clinical Impression(s) / ED Diagnoses Final diagnoses:  Lumbar radiculopathy    Rx /  DC Orders ED Discharge Orders         Ordered    predniSONE (DELTASONE) 20 MG tablet     02/23/19 0630    HYDROcodone-acetaminophen (NORCO/VICODIN) 5-325 MG tablet  Every 4 hours PRN     02/23/19 0630           Orpah Greek, MD 02/23/19 0630

## 2019-02-24 ENCOUNTER — Encounter (HOSPITAL_COMMUNITY): Payer: Self-pay | Admitting: Emergency Medicine

## 2019-02-24 ENCOUNTER — Other Ambulatory Visit: Payer: Self-pay

## 2019-02-24 ENCOUNTER — Emergency Department (HOSPITAL_COMMUNITY)
Admission: EM | Admit: 2019-02-24 | Discharge: 2019-02-24 | Disposition: A | Discharge status: Home / Self Care | Payer: Medicaid Other | Attending: Emergency Medicine | Admitting: Emergency Medicine

## 2019-02-24 DIAGNOSIS — Z79899 Other long term (current) drug therapy: Secondary | ICD-10-CM | POA: Diagnosis not present

## 2019-02-24 DIAGNOSIS — M545 Low back pain: Secondary | ICD-10-CM | POA: Diagnosis present

## 2019-02-24 DIAGNOSIS — M5126 Other intervertebral disc displacement, lumbar region: Secondary | ICD-10-CM | POA: Insufficient documentation

## 2019-02-24 DIAGNOSIS — M5136 Other intervertebral disc degeneration, lumbar region: Secondary | ICD-10-CM

## 2019-02-24 MED ORDER — OXYCODONE-ACETAMINOPHEN 5-325 MG PO TABS
1.0000 | ORAL_TABLET | Freq: Once | ORAL | Status: AC
  Administered 2019-02-24: 12:00:00 1 via ORAL
  Filled 2019-02-24: qty 1

## 2019-02-24 MED ORDER — OXYCODONE-ACETAMINOPHEN 5-325 MG PO TABS: 1.0000 | ORAL_TABLET | ORAL | 0 refills | Status: DC | PRN

## 2019-02-24 MED ORDER — KETOROLAC TROMETHAMINE 60 MG/2ML IM SOLN
60.0000 mg | Freq: Once | INTRAMUSCULAR | Status: AC
  Administered 2019-02-24: 10:00:00 60 mg via INTRAMUSCULAR
  Filled 2019-02-24: qty 2

## 2019-02-24 MED ORDER — HYDROMORPHONE HCL 1 MG/ML IJ SOLN
1.0000 mg | Freq: Once | INTRAMUSCULAR | Status: AC
  Administered 2019-02-24: 10:00:00 1 mg via INTRAMUSCULAR
  Filled 2019-02-24: qty 1

## 2019-02-24 NOTE — ED Triage Notes (Signed)
Pt arrives to ED from home with complaints of acute on chronic lower back pain. Patient had MRI done yesterday that showed spinal stenosis. Patient has follow up appointment with neurologist tomorrow. Patient is here because pain is so severe she cannot do ADL's on her own at this time. Patient states she can urinate but is constipated.

## 2019-02-24 NOTE — ED Provider Notes (Signed)
[facility] EMERGENCY DEPARTMENT Provider Note   CSN: [id number] Arrival date & time: 02/24/19  3016     History Chief Complaint  Patient presents with  . Back Pain    Lisa Christian is a 29 y.o. female.  HPI Patient presents to the emergency department with lower back pain has been ongoing since around Thanksgiving.  The patient states she was here 2 nights ago for a visit in the ER due to worsening pain.  The patient states that she was seen in an emergency department last night due to the fact that she was unable to get up off the floor at her house.  The patient states that nothing seems to make the condition better but certain movements and palpation make the pain worse.  Patient states she is been taking the medications as prescribed.  She states that she is not have any recent injuries.  The patient denies chest pain, shortness of breath, headache,blurred vision, neck pain, fever, cough, weakness, numbness, dizziness, anorexia, edema, abdominal pain, nausea, vomiting, diarrhea, rash, dysuria, hematemesis, bloody stool, near syncope, or syncope.    Past Medical History:  Diagnosis Date  . Anxiety   . Family history of adverse reaction to anesthesia   . GERD (gastroesophageal reflux disease)   . Panic attack   . Panic attacks   . Panic attacks     Patient Active Problem List   Diagnosis Date Noted  . Encounter for IUD insertion 02/24/2018  . Shoulder dystocia during labor and delivery, delivered 02/14/2015  . CIN I (cervical intraepithelial neoplasia I) 04/25/2014  . GERD (gastroesophageal reflux disease) 11/24/2013  . 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 Regurgitation 11/24/2013    Past Surgical History:  Procedure Laterality Date  . BIOPSY N/A 01/11/2014   Procedure: BIOPSY;  Surgeon: West Bali, MD;  Location: [facility];  Service: Endoscopy;  Laterality: N/A;  duodenal and gastric  . ESOPHAGOGASTRODUODENOSCOPY (EGD) WITH PROPOFOL N/A 01/11/2014   WFU:[id number] non-erosive  . NO PAST SURGERIES       OB History    Gravida  1   Para  1   Term  1   Preterm      AB      Living  1     SAB      TAB      Ectopic      Multiple  0   Live Births  1           Family History  Problem Relation Age of Onset  . Anxiety disorder Mother   . Heart disease Maternal Grandfather   . Cancer Maternal Grandmother        breast  . Colon cancer Other     Social History   Tobacco Use  . Smoking status: Never Smoker  . Smokeless tobacco: Never Used  Substance Use Topics  . Alcohol use: No    Comment: occasional  . Drug use: No    Home Medications Prior to Admission medications   Medication Sig Start Date End Date Taking? Authorizing Provider  acetaminophen (TYLENOL) 500 MG tablet Take 500 mg by mouth every 6 (six) hours as needed for mild pain.   Yes [provider]  baclofen (LIORESAL) 10 MG tablet Take 10 mg by mouth 3 (three) times daily as needed for muscle spasms. 01/27/19  Yes [provider]  HYDROcodone-acetaminophen (NORCO/VICODIN) 5-325 MG tablet Take 1 tablet by mouth every 4 (four) hours as needed for moderate pain.  02/23/19  Yes Pollina, Gwenyth Allegra, MD  levonorgestrel (LILETTA, 52 MG,) 19.5 MCG/DAY IUD IUD 1 each by Intrauterine route once.   Yes [provider]  predniSONE (DELTASONE) 20 MG tablet 3 tabs po daily x 3 days, then 2 tabs x 3 days, then 1.5 tabs x 3 days, then 1 tab x 3 days, then 0.5 tabs x 3 days Patient taking differently: Take 10-60 mg by mouth See admin instructions. Take 60mg  daily x 3 days, then 40mg  daily x 3 days, then 30mg  daily x 3 days, then 20mg  daily x 3 days, then 10mg  daily  x 3 days 02/23/19  Yes Pollina, Gwenyth Allegra, MD  Vitamin D, Ergocalciferol, (DRISDOL) 1.25 MG (50000 UNIT) CAPS capsule Take 50,000 Units by mouth once a week. 01/31/19  Yes [provider]  gabapentin (NEURONTIN) 300 MG capsule Take 300 mg by mouth at bedtime. 01/27/19    [provider]    Allergies    Patient has no known allergies.  Review of Systems   Review of Systems All other systems negative except as documented in the HPI. All pertinent positives and negatives as reviewed in the HPI. Physical Exam Updated Vital Signs BP 120/83 (BP Location: Left Arm)   Pulse (!) 115   Temp 98.4 F (36.9 C) (Oral)   Resp 20   SpO2 100%   Physical Exam Vitals and nursing note reviewed.  Constitutional:      General: She is not in acute distress.    Appearance: She is well-developed.  HENT:     Head: Normocephalic and atraumatic.  Eyes:     Pupils: Pupils are equal, round, and reactive to light.  Pulmonary:     Effort: Pulmonary effort is normal.  Skin:    General: Skin is warm and dry.  Neurological:     Mental Status: She is alert and oriented to person, place, and time.     Sensory: Sensation is intact. No sensory deficit.     Motor: Motor function is intact. No weakness.     Coordination: Coordination is intact. Coordination normal.     Gait: Gait normal.     Deep Tendon Reflexes: Reflexes normal.     Reflex Scores:      Patellar reflexes are 2+ on the right side and 2+ on the left side.      Achilles reflexes are 2+ on the right side and 2+ on the left side.    ED Results / Procedures / Treatments   Labs (all labs ordered are listed, but only abnormal results are displayed) Labs Reviewed - No data to display  EKG None  Radiology MR LUMBAR SPINE WO CONTRAST  Result Date: 02/23/2019 CLINICAL DATA:  Progressive low back pain for for over 6 weeks with numbness EXAM: MRI LUMBAR SPINE WITHOUT CONTRAST TECHNIQUE: Multiplanar, multisequence MR imaging of the lumbar spine was performed. No intravenous contrast was administered. COMPARISON:  None. FINDINGS: Segmentation: Transitional S1 vertebra with rudimentary S1-2 disc space. Alignment:  Normal Vertebrae:  No fracture, evidence of discitis, or bone lesion. Conus medullaris and cauda  equina: Conus extends to the L1 level. Conus and cauda equina appear normal. Paraspinal and other soft tissues: Negative Disc levels: T12- L1: Unremarkable. L1-L2: Unremarkable. L2-L3: Unremarkable. L3-L4: Unremarkable. L4-L5: Disc narrowing and desiccation with left eccentric and inferior pointing disc extrusion causing high-grade spinal stenosis and asymmetric compression of the descending left L5 nerve root. The foramina are patent. L5-S1:Disc narrowing and mild endplate degeneration with desiccation. Right  eccentric protrusion which contacts the descending S1 nerve root. The foramina are patent. IMPRESSION: 1. L4-5 left eccentric extrusion with high-grade/compressive spinal stenosis. 2. L5-S1 right eccentric protrusion contacting the right S1 nerve root. Electronically Signed   By: Marnee Spring M.D.   On: 02/23/2019 05:03    Procedures Procedures (including critical care time)  Medications Ordered in ED Medications  oxyCODONE-acetaminophen (PERCOCET/ROXICET) 5-325 MG per tablet 1 tablet (has no administration in time range)  ketorolac (TORADOL) injection 60 mg (60 mg Intramuscular Given 02/24/19 0957)  HYDROmorphone (DILAUDID) injection 1 mg (1 mg Intramuscular Given 02/24/19 7782)    ED Course  I have reviewed the triage vital signs and the nursing notes.  Pertinent labs & imaging results that were available during my care of the patient were reviewed by me and considered in my medical decision making (see chart for details).    MDM Rules/Calculators/A&P                      I would advise taking a stool softener to the patient to the fact she is having some constipation.  The patient is given pain control here in the emergency department she states is helped significantly.  Patient is concerned that she will have return of pain.  I did advise her that she had a 7 appointment tomorrow with the neurosurgeons.  I advised the patient return here for any worsening in her condition. Final  Clinical Impression(s) / ED Diagnoses Final diagnoses:  None    Rx / DC Orders ED Discharge Orders    None       Charlestine Night, PA-C 02/24/19 1134    Pricilla Loveless, MD 02/24/19 [phone]

## 2019-02-24 NOTE — Discharge Instructions (Addendum)
Follow-up tomorrow as scheduled with the neurosurgeon.  Return here as needed.

## 2019-02-25 ENCOUNTER — Other Ambulatory Visit: Payer: Self-pay | Admitting: Neurosurgery

## 2019-02-25 ENCOUNTER — Other Ambulatory Visit: Payer: Self-pay

## 2019-02-25 ENCOUNTER — Encounter (HOSPITAL_COMMUNITY): Payer: Self-pay | Admitting: Emergency Medicine

## 2019-02-25 ENCOUNTER — Emergency Department (HOSPITAL_COMMUNITY)
Admission: EM | Admit: 2019-02-25 | Discharge: 2019-02-25 | Disposition: A | Discharge status: Home / Self Care | Payer: Medicaid Other | Attending: Emergency Medicine | Admitting: Emergency Medicine

## 2019-02-25 DIAGNOSIS — M5432 Sciatica, left side: Secondary | ICD-10-CM | POA: Diagnosis not present

## 2019-02-25 DIAGNOSIS — M79605 Pain in left leg: Secondary | ICD-10-CM | POA: Diagnosis present

## 2019-02-25 DIAGNOSIS — Z79899 Other long term (current) drug therapy: Secondary | ICD-10-CM | POA: Diagnosis not present

## 2019-02-25 MED ORDER — LORAZEPAM 2 MG/ML IJ SOLN
0.5000 mg | Freq: Once | INTRAMUSCULAR | Status: AC
  Administered 2019-02-25: 0.5 mg via INTRAVENOUS
  Filled 2019-02-25: qty 1

## 2019-02-25 MED ORDER — HYDROMORPHONE HCL 1 MG/ML IJ SOLN
1.0000 mg | Freq: Once | INTRAMUSCULAR | Status: AC
  Administered 2019-02-25: 1 mg via INTRAVENOUS
  Filled 2019-02-25: qty 1

## 2019-02-25 NOTE — Discharge Instructions (Addendum)
Follow up with your appointment today

## 2019-02-25 NOTE — ED Triage Notes (Signed)
Pt c/o left leg pain. Pt states she has been to [facility] ED x2 and has had a MRI. States she has to see "spine specialist" today.

## 2019-02-25 NOTE — ED Provider Notes (Signed)
[facility] EMERGENCY DEPARTMENT Provider Note   CSN: [id number] Arrival date & time: 02/25/19  1696     History Chief Complaint  Patient presents with  . Leg Pain    Lisa Christian is a 29 y.o. female.  Patient with back pain radiating down her left leg.  She had a MRI the other day that showed L4-L5 disc affecting the left leg.  She has an appointment today with neurosurgery.  She comes in this morning complaining of pain on that leg she is only taken 1 Percocet  The history is provided by the patient. No language interpreter was used.  Leg Pain Location:  Leg Leg location:  L leg Pain details:    Quality:  Aching   Radiates to:  L leg   Severity:  Severe   Onset quality:  Sudden   Timing:  Constant Associated symptoms: no back pain and no fatigue        Past Medical History:  Diagnosis Date  . Anxiety   . Family history of adverse reaction to anesthesia   . GERD (gastroesophageal reflux disease)   . Panic attack   . Panic attacks   . Panic attacks     Patient Active Problem List   Diagnosis Date Noted  . Encounter for IUD insertion 02/24/2018  . Shoulder dystocia during labor and delivery, delivered 02/14/2015  . CIN I (cervical intraepithelial neoplasia I) 04/25/2014  . GERD (gastroesophageal reflux disease) 11/24/2013  . 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 Regurgitation 11/24/2013    Past Surgical History:  Procedure Laterality Date  . BIOPSY N/A 01/11/2014   Procedure: BIOPSY;  Surgeon: Danie Binder, MD;  Location: [facility];  Service: Endoscopy;  Laterality: N/A;  duodenal and gastric  . ESOPHAGOGASTRODUODENOSCOPY (EGD) WITH PROPOFOL N/A 01/11/2014   VEL:[id number]/CHEN non-erosive  . NO PAST SURGERIES       OB History    Gravida  1   Para  1   Term  1   Preterm      AB      Living  1     SAB      TAB      Ectopic      Multiple  0   Live Births  1           Family History  Problem Relation Age of Onset  . Anxiety disorder  Mother   . Heart disease Maternal Grandfather   . Cancer Maternal Grandmother        breast  . Colon cancer Other     Social History   Tobacco Use  . Smoking status: Never Smoker  . Smokeless tobacco: Never Used  Substance Use Topics  . Alcohol use: No    Comment: occasional  . Drug use: No    Home Medications Prior to Admission medications   Medication Sig Start Date End Date Taking? Authorizing Provider  acetaminophen (TYLENOL) 500 MG tablet Take 500 mg by mouth every 6 (six) hours as needed for mild pain.    [provider]  baclofen (LIORESAL) 10 MG tablet Take 10 mg by mouth 3 (three) times daily as needed for muscle spasms. 01/27/19   [provider]  gabapentin (NEURONTIN) 300 MG capsule Take 300 mg by mouth at bedtime. 01/27/19   [provider]  HYDROcodone-acetaminophen (NORCO/VICODIN) 5-325 MG tablet Take 1 tablet by mouth every 4 (four) hours as needed for moderate pain. 02/23/19   Orpah Greek, MD  levonorgestrel (  LILETTA, 52 MG,) 19.5 MCG/DAY IUD IUD 1 each by Intrauterine route once.    [provider]  oxyCODONE-acetaminophen (PERCOCET/ROXICET) 5-325 MG tablet Take 1 tablet by mouth every 4 (four) hours as needed for severe pain. 02/24/19   Lawyer, Cristal Deer, PA-C  predniSONE (DELTASONE) 20 MG tablet 3 tabs po daily x 3 days, then 2 tabs x 3 days, then 1.5 tabs x 3 days, then 1 tab x 3 days, then 0.5 tabs x 3 days Patient taking differently: Take 10-60 mg by mouth See admin instructions. Take 60mg  daily x 3 days, then 40mg  daily x 3 days, then 30mg  daily x 3 days, then 20mg  daily x 3 days, then 10mg  daily  x 3 days 02/23/19   , MD  Vitamin D, Ergocalciferol, (DRISDOL) 1.25 MG (50000 UNIT) CAPS capsule Take 50,000 Units by mouth once a week. 01/31/19   [provider]    Allergies    Patient has no known allergies.  Review of Systems   Review of Systems  Constitutional: Negative for appetite change  and fatigue.  HENT: Negative for congestion, ear discharge and sinus pressure.   Eyes: Negative for discharge.  Respiratory: Negative for cough.   Cardiovascular: Negative for chest pain.  Gastrointestinal: Negative for abdominal pain and diarrhea.  Genitourinary: Negative for frequency and hematuria.  Musculoskeletal: Negative for back pain.       Left leg pain  Skin: Negative for rash.  Neurological: Negative for seizures and headaches.  Psychiatric/Behavioral: Negative for hallucinations.    Physical Exam Updated Vital Signs BP 102/72   Pulse 72   Temp 98 F (36.7 C)   Resp 18   Ht 5\' 6"  (1.676 m)   Wt 86.2 kg   SpO2 100%   BMI 30.67 kg/m   Physical Exam Vitals and nursing note reviewed.  Constitutional:      Appearance: She is well-developed.  HENT:     Head: Normocephalic.     Nose: Nose normal.  Eyes:     General: No scleral icterus.    Conjunctiva/sclera: Conjunctivae normal.  Neck:     Thyroid: No thyromegaly.  Cardiovascular:     Rate and Rhythm: Normal rate and regular rhythm.     Heart sounds: No murmur. No friction rub. No gallop.   Pulmonary:     Breath sounds: No stridor. No wheezing or rales.  Chest:     Chest wall: No tenderness.  Abdominal:     General: There is no distension.     Tenderness: There is no abdominal tenderness. There is no rebound.  Musculoskeletal:        General: Normal range of motion.     Cervical back: Neck supple.     Comments: Tenderness posterior left leg with positive straight leg raise  Lymphadenopathy:     Cervical: No cervical adenopathy.  Skin:    Findings: No erythema or rash.  Neurological:     Mental Status: She is oriented to person, place, and time.     Motor: No abnormal muscle tone.     Coordination: Coordination normal.  Psychiatric:        Behavior: Behavior normal.     ED Results / Procedures / Treatments   Labs (all labs ordered are listed, but only abnormal results are displayed) Labs  Reviewed - No data to display  EKG None  Radiology No results found.  Procedures Procedures (including critical care time)  Medications Ordered in ED Medications  HYDROmorphone (DILAUDID)  injection 1 mg (1 mg Intravenous Given 02/25/19 0807)  LORazepam (ATIVAN) injection 0.5 mg (0.5 mg Intravenous Given 02/25/19 1761)    ED Course  I have reviewed the triage vital signs and the nursing notes.  Pertinent labs & imaging results that were available during my care of the patient were reviewed by me and considered in my medical decision making (see chart for details).    MDM Rules/Calculators/A&P                      Patient with sciatica on the left side down her left leg.  Patient improved with pain meds she is to follow-up with neurosurgery today Final Clinical Impression(s) / ED Diagnoses Final diagnoses:  Left leg pain    Rx / DC Orders ED Discharge Orders    None       Bethann Berkshire, MD 02/25/19 [phone]

## 2019-02-26 ENCOUNTER — Other Ambulatory Visit: Payer: Self-pay

## 2019-02-26 ENCOUNTER — Encounter (HOSPITAL_COMMUNITY): Payer: Self-pay | Admitting: Neurosurgery

## 2019-02-26 NOTE — Progress Notes (Signed)
Nurse spoke with Erie Noe, CMA, to make MD aware that pt needs clarification on pre-op steroid instructions and DOS.

## 2019-02-26 NOTE — Progress Notes (Signed)
Pt denies SOB, chest pain, and being under the care of a cardiologist. Pt denies having a stress test and cardiac cath. Pt stated that the echo that was performed in 2015 "  was okay, it was stress. "  Nurse requested LOV note, echo and old EKG tracing from [facility]); awaiting response. Pt denies having an EKG and chest x ray in the last year. Pt denies recent labs. Pt made aware to stop taking Aspirin (unless otherwise advised by surgeon), vitamins, fish oil and herbal medications. Do not take any NSAIDs ie: Ibuprofen, Advil, Naproxen (Aleve), Motrin, BC and Goody Powder. Pt made aware to go for COVID test on Saturday, 02/27/19 between 9:00 A.M. and 12:00 noon at [address] [facility] in [place] ( the old [facility]).  Pt stated that she is aware of location. Pt made aware to quarantine after COVID test. Pt verbalized understanding of all pre-op instructions.

## 2019-02-27 ENCOUNTER — Other Ambulatory Visit: Payer: Self-pay

## 2019-02-27 ENCOUNTER — Emergency Department (HOSPITAL_COMMUNITY)
Admission: EM | Admit: 2019-02-27 | Discharge: 2019-02-27 | Disposition: A | Discharge status: Home / Self Care | Payer: Medicaid Other | Attending: Emergency Medicine | Admitting: Emergency Medicine

## 2019-02-27 ENCOUNTER — Encounter (HOSPITAL_COMMUNITY): Payer: Self-pay | Admitting: *Deleted

## 2019-02-27 DIAGNOSIS — M545 Low back pain: Secondary | ICD-10-CM | POA: Diagnosis present

## 2019-02-27 DIAGNOSIS — M5441 Lumbago with sciatica, right side: Secondary | ICD-10-CM | POA: Diagnosis not present

## 2019-02-27 DIAGNOSIS — Z20822 Contact with and (suspected) exposure to covid-19: Secondary | ICD-10-CM | POA: Insufficient documentation

## 2019-02-27 DIAGNOSIS — Z01812 Encounter for preprocedural laboratory examination: Secondary | ICD-10-CM | POA: Insufficient documentation

## 2019-02-27 DIAGNOSIS — M5126 Other intervertebral disc displacement, lumbar region: Secondary | ICD-10-CM | POA: Insufficient documentation

## 2019-02-27 DIAGNOSIS — Z760 Encounter for issue of repeat prescription: Secondary | ICD-10-CM | POA: Insufficient documentation

## 2019-02-27 DIAGNOSIS — G8929 Other chronic pain: Secondary | ICD-10-CM

## 2019-02-27 LAB — SARS CORONAVIRUS 2 (TAT 6-24 HRS): SARS Coronavirus 2: NEGATIVE

## 2019-02-27 MED ORDER — METHOCARBAMOL 500 MG PO TABS: 500.0000 mg | ORAL_TABLET | Freq: Two times a day (BID) | ORAL | 0 refills | Status: DC

## 2019-02-27 MED ORDER — LIDOCAINE 5 % EX PTCH
1.0000 | MEDICATED_PATCH | CUTANEOUS | Status: DC
  Administered 2019-02-27: 1 via TRANSDERMAL
  Filled 2019-02-27: qty 1

## 2019-02-27 MED ORDER — HYDROMORPHONE HCL 1 MG/ML IJ SOLN
1.0000 mg | Freq: Once | INTRAMUSCULAR | Status: AC
  Administered 2019-02-27: 1 mg via INTRAMUSCULAR
  Filled 2019-02-27: qty 1

## 2019-02-27 MED ORDER — LIDOCAINE 5 % EX PTCH: 1.0000 | MEDICATED_PATCH | CUTANEOUS | 0 refills | Status: DC

## 2019-02-27 MED ORDER — ACETAMINOPHEN 500 MG PO TABS: 1000.0000 mg | ORAL_TABLET | Freq: Four times a day (QID) | ORAL | 0 refills | Status: DC | PRN

## 2019-02-27 NOTE — ED Triage Notes (Signed)
PT presents today with back pain and Pt is to have surgery on Monday for lumbar laminectomy and micro - discectomy on 01-29-19 . Pt needs a refill of pain meds. Pt came to ED 02-24-19.

## 2019-02-27 NOTE — ED Notes (Signed)
Pt verbalizes understanding of D/C instructions.

## 2019-02-27 NOTE — Discharge Instructions (Signed)
Continue taking the steroids as prescribed. Take Tylenol for pain control. Use lidocaine patches for pain control. Stop taking baclofen and start taking Robaxin. Call your primary care doctor and/or the back surgeon for refills of pain medication as needed. Return to the emergency room with any new, worsening, concerning symptoms.

## 2019-02-27 NOTE — ED Provider Notes (Signed)
[facility] EMERGENCY DEPARTMENT Provider Note   CSN: [id number] Arrival date & time: 02/27/19  0805     History Chief Complaint  Patient presents with  . Back Pain    Lisa Christian is a 29 y.o. female presented for evaluation of back pain and medication refill.  Patient states she has had back pain for several months.  It worsened last week.  She has been seen multiple times in the ER for this.  Patient stated appoint with her neurosurgeon last week, scheduled to have surgery in 2 days.  Patient states she ran out of her pain medication today and forgot to ask her neurosurgeon for more at her last appointment.  She is requesting refill of oxycodone.  She denies any change or worsening pain.  She has had no fall or injury.  Patient states she is also taking Medrol Dosepak as prescribed and baclofen as prescribed without significant improvement.  She is not taking anything like Tylenol or ibuprofen.  Additionally, patient states she feels her legs are gradually getting more swollen.  No erythema, warmth, or numbness.  Additional history obtained in chart review.  Reviewed 3 ER visits last week.  Reviewed MRI findings.  HPI     Past Medical History:  Diagnosis Date  . Anxiety   . Family history of adverse reaction to anesthesia    " not sure why second cousin can't get put to sleep."  . GERD (gastroesophageal reflux disease)   . Headache   . HNP (herniated nucleus pulposus), lumbar   . Panic attack   . Panic attacks   . Panic attacks   . Vitamin D deficiency     Patient Active Problem List   Diagnosis Date Noted  . Encounter for IUD insertion 02/24/2018  . Shoulder dystocia during labor and delivery, delivered 02/14/2015  . CIN I (cervical intraepithelial neoplasia I) 04/25/2014  . GERD (gastroesophageal reflux disease) 11/24/2013  . 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 Regurgitation 11/24/2013    Past Surgical History:  Procedure Laterality Date  . BIOPSY  N/A 01/11/2014   Procedure: BIOPSY;  Surgeon: West Bali, MD;  Location: [facility];  Service: Endoscopy;  Laterality: N/A;  duodenal and gastric  . ESOPHAGOGASTRODUODENOSCOPY (EGD) WITH PROPOFOL N/A 01/11/2014   WUJ:[id number] non-erosive  . WISDOM TOOTH EXTRACTION       OB History    Gravida  1   Para  1   Term  1   Preterm      AB      Living  1     SAB      TAB      Ectopic      Multiple  0   Live Births  1           Family History  Problem Relation Age of Onset  . Anxiety disorder Mother   . Heart disease Maternal Grandfather   . Cancer Maternal Grandmother        breast  . Colon cancer Other     Social History   Tobacco Use  . Smoking status: Never Smoker  . Smokeless tobacco: Never Used  Substance Use Topics  . Alcohol use: Not Currently  . Drug use: No    Home Medications Prior to Admission medications   Medication Sig Start Date End Date Taking? Authorizing Provider  acetaminophen (TYLENOL) 500 MG tablet Take 2 tablets (1,000 mg total) by mouth every 6 (six) hours as needed for mild pain  or moderate pain. 02/27/19   Sadiyah Kangas, PA-C  baclofen (LIORESAL) 10 MG tablet Take 10 mg by mouth 3 (three) times daily as needed for muscle spasms. 01/27/19   [provider]  gabapentin (NEURONTIN) 300 MG capsule Take 300 mg by mouth at bedtime. 01/27/19   [provider]  HYDROcodone-acetaminophen (NORCO/VICODIN) 5-325 MG tablet Take 1 tablet by mouth every 4 (four) hours as needed for moderate pain. 02/23/19   Gilda Crease, MD  levonorgestrel (LILETTA, 52 MG,) 19.5 MCG/DAY IUD IUD 1 each by Intrauterine route once.    [provider]  lidocaine (LIDODERM) 5 % Place 1 patch onto the skin daily. Remove & Discard patch within 12 hours or as directed by MD 02/27/19   Beola Vasallo, PA-C  methocarbamol (ROBAXIN) 500 MG tablet Take 1 tablet (500 mg total) by mouth 2 (two) times daily. 02/27/19   Lady Wisham, PA-C    oxyCODONE-acetaminophen (PERCOCET/ROXICET) 5-325 MG tablet Take 1 tablet by mouth every 4 (four) hours as needed for severe pain. 02/24/19   Lawyer, Cristal Deer, PA-C  predniSONE (DELTASONE) 20 MG tablet 3 tabs po daily x 3 days, then 2 tabs x 3 days, then 1.5 tabs x 3 days, then 1 tab x 3 days, then 0.5 tabs x 3 days Patient taking differently: Take 10-60 mg by mouth See admin instructions. Take 60mg  daily x 3 days, then 40mg  daily x 3 days, then 30mg  daily x 3 days, then 20mg  daily x 3 days, then 10mg  daily  x 3 days 02/23/19   , MD  Vitamin D, Ergocalciferol, (DRISDOL) 1.25 MG (50000 UNIT) CAPS capsule Take 50,000 Units by mouth once a week. 01/31/19   [provider]    Allergies    Patient has no known allergies.  Review of Systems   Review of Systems  Constitutional: Negative for fever.  Musculoskeletal: Positive for back pain.    Physical Exam Updated Vital Signs BP 118/87 (BP Location: Right Arm)   Pulse 60   Temp 98.1 F (36.7 C) (Oral)   Resp 20   Ht 5\' 6"  (1.676 m)   Wt 86.2 kg   SpO2 98%   BMI 30.67 kg/m   Physical Exam Vitals and nursing note reviewed.  Constitutional:      General: She is not in acute distress.    Appearance: She is well-developed.     Comments: Obese female sitting comfortably in bed in no acute distress  HENT:     Head: Normocephalic and atraumatic.  Eyes:     Extraocular Movements: Extraocular movements intact.     Conjunctiva/sclera: Conjunctivae normal.     Pupils: Pupils are equal, round, and reactive to light.  Cardiovascular:     Rate and Rhythm: Normal rate and regular rhythm.     Pulses: Normal pulses.  Pulmonary:     Effort: Pulmonary effort is normal. No respiratory distress.     Breath sounds: Normal breath sounds. No wheezing.  Abdominal:     General: There is no distension.     Palpations: Abdomen is soft. There is no mass.     Tenderness: There is no abdominal tenderness. There is no guarding  or rebound.  Musculoskeletal:        General: Normal range of motion.     Cervical back: Normal range of motion and neck supple.     Comments: Tenderness palpation of low back.  No step-offs or deformities. Pedal pulses 2+ bilaterally.  No saddle paresthesias.  No significant lower extremity swelling or edema  Skin:    General: Skin is warm and dry.     Capillary Refill: Capillary refill takes less than 2 seconds.  Neurological:     Mental Status: She is alert and oriented to person, place, and time.     ED Results / Procedures / Treatments   Labs (all labs ordered are listed, but only abnormal results are displayed) Labs Reviewed - No data to display  EKG None  Radiology No results found.  Procedures Procedures (including critical care time)  Medications Ordered in ED Medications  lidocaine (LIDODERM) 5 % 1 patch (1 patch Transdermal Patch Applied 02/27/19 0905)  HYDROmorphone (DILAUDID) injection 1 mg (1 mg Intramuscular Given 02/27/19 0905)    ED Course  I have reviewed the triage vital signs and the nursing notes.  Pertinent labs & imaging results that were available during my care of the patient were reviewed by me and considered in my medical decision making (see chart for details).    MDM Rules/Calculators/A&P                      Presenting for evaluation of back pain and request for oxycodone refill.  On exam, patient is nontoxic.  No new symptoms.  No signs of infection or new neurologic deficits.  Doubt acute neurosurgical emergency today.  I do not believe she needs repeat imaging or labs.  Patient states she is here for medication refill.  Discussed with patient that she has had multiple ER visits with narcotic prescriptions.  As such, will manage pain in the ER, but will not give prescription for narcotics.  Encourage patient to follow-up with her neurosurgeon and/or primary care doctor.  Instead patient instructed on Tylenol and Lidoderm use.  Patient requesting  change in muscle relaxer, will switch from baclofen to Robaxin.  Patient concerned about potential leg swelling.  On exam, no significant edema edema or swelling.  Pedal pulses 2+ and patient with good cap refill.  No sign of acute emergency.  Patient supposed to get preop covid test today, will perform test in the ED. Discussed findings and plan with patient.  At this time, patient appears safe for discharge.  Return precautions given.  Patient states she understands and agrees to plan.  Final Clinical Impression(s) / ED Diagnoses Final diagnoses:  Chronic midline low back pain with right-sided sciatica    Rx / DC Orders ED Discharge Orders         Ordered    lidocaine (LIDODERM) 5 %  Every 24 hours     02/27/19 0900    methocarbamol (ROBAXIN) 500 MG tablet  2 times daily     02/27/19 0900    acetaminophen (TYLENOL) 500 MG tablet  Every 6 hours PRN     02/27/19 0900           Mily Malecki, PA-C 02/27/19 2595    Wyvonnia Dusky, MD 02/27/19 [phone]

## 2019-02-27 NOTE — ED Notes (Signed)
Pt given IM pain med will monitor and check Vitals at DC

## 2019-03-01 ENCOUNTER — Encounter (HOSPITAL_COMMUNITY)
Admission: RE | Disposition: A | Discharge status: Home / Self Care | Payer: Self-pay | Source: Home / Self Care | Attending: Neurosurgery

## 2019-03-01 ENCOUNTER — Ambulatory Visit (HOSPITAL_COMMUNITY): Payer: Medicaid Other

## 2019-03-01 ENCOUNTER — Other Ambulatory Visit: Payer: Self-pay

## 2019-03-01 ENCOUNTER — Ambulatory Visit (HOSPITAL_COMMUNITY)
Admission: RE | Admit: 2019-03-01 | Discharge: 2019-03-02 | Disposition: A | Discharge status: Home / Self Care | Payer: Medicaid Other | Attending: Neurosurgery | Admitting: Neurosurgery

## 2019-03-01 ENCOUNTER — Ambulatory Visit (HOSPITAL_COMMUNITY): Payer: Medicaid Other | Admitting: Physician Assistant

## 2019-03-01 ENCOUNTER — Encounter (HOSPITAL_COMMUNITY): Payer: Self-pay | Admitting: Neurosurgery

## 2019-03-01 DIAGNOSIS — Z79899 Other long term (current) drug therapy: Secondary | ICD-10-CM | POA: Diagnosis not present

## 2019-03-01 DIAGNOSIS — Z793 Long term (current) use of hormonal contraceptives: Secondary | ICD-10-CM | POA: Insufficient documentation

## 2019-03-01 DIAGNOSIS — Z419 Encounter for procedure for purposes other than remedying health state, unspecified: Secondary | ICD-10-CM

## 2019-03-01 DIAGNOSIS — M5126 Other intervertebral disc displacement, lumbar region: Secondary | ICD-10-CM | POA: Diagnosis present

## 2019-03-01 HISTORY — DX: Vitamin D deficiency, unspecified: E55.9

## 2019-03-01 HISTORY — DX: Headache, unspecified: R51.9

## 2019-03-01 HISTORY — DX: Other intervertebral disc displacement, lumbar region: M51.26

## 2019-03-01 HISTORY — PX: LUMBAR LAMINECTOMY/DECOMPRESSION MICRODISCECTOMY: SHX5026

## 2019-03-01 LAB — BASIC METABOLIC PANEL
Anion gap: 11 (ref 5–15)
BUN: 9 mg/dL (ref 6–20)
CO2: 25 mmol/L (ref 22–32)
Calcium: 9.3 mg/dL (ref 8.9–10.3)
Chloride: 103 mmol/L (ref 98–111)
Creatinine, Ser: 0.56 mg/dL (ref 0.44–1.00)
GFR calc Af Amer: >60 mL/min (ref >60)
GFR calc non Af Amer: >60 mL/min (ref >60)
Glucose, Bld: 99 mg/dL (ref 70–99)
Potassium: 3.8 mmol/L (ref 3.5–5.1)
Sodium: 139 mmol/L (ref 135–145)

## 2019-03-01 LAB — CBC
HCT: 39.2 % (ref 36.0–46.0)
Hemoglobin: 13.2 g/dL (ref 12.0–15.0)
MCH: 32.3 pg (ref 26.0–34.0)
MCHC: 33.7 g/dL (ref 30.0–36.0)
MCV: 95.8 fL (ref 80.0–100.0)
Platelets: 328 10*3/uL (ref 150–400)
RBC: 4.09 MIL/uL (ref 3.87–5.11)
RDW: 11.7 % (ref 11.5–15.5)
WBC: 12.3 10*3/uL — ABNORMAL HIGH (ref 4.0–10.5)
nRBC: 0 % (ref 0.0–0.2)

## 2019-03-01 LAB — POCT PREGNANCY, URINE: Preg Test, Ur: NEGATIVE

## 2019-03-01 SURGERY — LUMBAR LAMINECTOMY/DECOMPRESSION MICRODISCECTOMY 1 LEVEL
Anesthesia: General | Site: Back | Laterality: Left

## 2019-03-01 MED ORDER — THROMBIN 5000 UNITS EX SOLR
CUTANEOUS | Status: DC | PRN
  Administered 2019-03-01: 10000 [IU] via TOPICAL

## 2019-03-01 MED ORDER — VITAMIN D (ERGOCALCIFEROL) 1.25 MG (50000 UNIT) PO CAPS
50000.0000 [IU] | ORAL_CAPSULE | ORAL | Status: DC
  Administered 2019-03-01: 50000 [IU] via ORAL
  Filled 2019-03-01 (×2): qty 1

## 2019-03-01 MED ORDER — PROPOFOL 10 MG/ML IV BOLUS
INTRAVENOUS | Status: AC
  Filled 2019-03-01: qty 20

## 2019-03-01 MED ORDER — ALUM & MAG HYDROXIDE-SIMETH 200-200-20 MG/5ML PO SUSP: 30.0000 mL | Freq: Four times a day (QID) | ORAL | Status: DC | PRN

## 2019-03-01 MED ORDER — DEXAMETHASONE SODIUM PHOSPHATE 10 MG/ML IJ SOLN
INTRAMUSCULAR | Status: AC
  Filled 2019-03-01: qty 1

## 2019-03-01 MED ORDER — GABAPENTIN 300 MG PO CAPS
300.0000 mg | ORAL_CAPSULE | Freq: Every day | ORAL | Status: DC
  Administered 2019-03-01: 300 mg via ORAL
  Filled 2019-03-01: qty 1

## 2019-03-01 MED ORDER — SODIUM CHLORIDE 0.9 % IV SOLN: 250.0000 mL | INTRAVENOUS | Status: DC

## 2019-03-01 MED ORDER — MEPERIDINE HCL 25 MG/ML IJ SOLN: 6.2500 mg | INTRAMUSCULAR | Status: DC | PRN

## 2019-03-01 MED ORDER — 0.9 % SODIUM CHLORIDE (POUR BTL) OPTIME
TOPICAL | Status: DC | PRN
  Administered 2019-03-01: 1000 mL

## 2019-03-01 MED ORDER — PREDNISONE 20 MG PO TABS: 20.0000 mg | ORAL_TABLET | Freq: Every day | ORAL | Status: DC

## 2019-03-01 MED ORDER — HYDROCODONE-ACETAMINOPHEN 5-325 MG PO TABS: 1.0000 | ORAL_TABLET | ORAL | Status: DC | PRN

## 2019-03-01 MED ORDER — HYDROMORPHONE HCL 1 MG/ML IJ SOLN
0.5000 mg | INTRAMUSCULAR | Status: DC | PRN
  Administered 2019-03-01: 0.5 mg via INTRAVENOUS
  Filled 2019-03-01: qty 0.5

## 2019-03-01 MED ORDER — THROMBIN 5000 UNITS EX SOLR
CUTANEOUS | Status: AC
  Filled 2019-03-01: qty 10000

## 2019-03-01 MED ORDER — SODIUM CHLORIDE 0.9 % IV SOLN
INTRAVENOUS | Status: DC | PRN
  Administered 2019-03-01: 500 mL

## 2019-03-01 MED ORDER — LIDOCAINE-EPINEPHRINE 1 %-1:100000 IJ SOLN
INTRAMUSCULAR | Status: DC | PRN
  Administered 2019-03-01: 10 mL

## 2019-03-01 MED ORDER — PREDNISONE 20 MG PO TABS
30.0000 mg | ORAL_TABLET | Freq: Every day | ORAL | Status: DC
  Administered 2019-03-02: 30 mg via ORAL
  Filled 2019-03-01: qty 1

## 2019-03-01 MED ORDER — CEFAZOLIN SODIUM-DEXTROSE 2-3 GM-%(50ML) IV SOLR
INTRAVENOUS | Status: DC | PRN
  Administered 2019-03-01: 2 g via INTRAVENOUS

## 2019-03-01 MED ORDER — OXYCODONE-ACETAMINOPHEN 5-325 MG PO TABS: 1.0000 | ORAL_TABLET | ORAL | Status: DC | PRN

## 2019-03-01 MED ORDER — FENTANYL CITRATE (PF) 100 MCG/2ML IJ SOLN
INTRAMUSCULAR | Status: DC | PRN
  Administered 2019-03-01: 50 ug via INTRAVENOUS
  Administered 2019-03-01: 150 ug via INTRAVENOUS
  Administered 2019-03-01: 50 ug via INTRAVENOUS

## 2019-03-01 MED ORDER — PANTOPRAZOLE SODIUM 40 MG PO TBEC
40.0000 mg | DELAYED_RELEASE_TABLET | Freq: Every day | ORAL | Status: DC
  Administered 2019-03-01: 40 mg via ORAL
  Filled 2019-03-01: qty 1

## 2019-03-01 MED ORDER — LEVONORGESTREL 19.5 MCG/DAY IU IUD: 1.0000 | INTRAUTERINE_SYSTEM | Freq: Once | INTRAUTERINE | Status: DC

## 2019-03-01 MED ORDER — PHENOL 1.4 % MT LIQD: 1.0000 | OROMUCOSAL | Status: DC | PRN

## 2019-03-01 MED ORDER — OXYCODONE-ACETAMINOPHEN 5-325 MG PO TABS
1.0000 | ORAL_TABLET | ORAL | Status: DC | PRN
  Administered 2019-03-01 – 2019-03-02 (×2): 1 via ORAL
  Filled 2019-03-01 (×3): qty 1

## 2019-03-01 MED ORDER — CEFAZOLIN SODIUM-DEXTROSE 2-4 GM/100ML-% IV SOLN
2.0000 g | Freq: Three times a day (TID) | INTRAVENOUS | Status: AC
  Administered 2019-03-01 (×2): 2 g via INTRAVENOUS
  Filled 2019-03-01 (×2): qty 100

## 2019-03-01 MED ORDER — BUPIVACAINE HCL (PF) 0.25 % IJ SOLN
INTRAMUSCULAR | Status: AC
  Filled 2019-03-01: qty 30

## 2019-03-01 MED ORDER — ONDANSETRON HCL 4 MG/2ML IJ SOLN: 4.0000 mg | Freq: Four times a day (QID) | INTRAMUSCULAR | Status: DC | PRN

## 2019-03-01 MED ORDER — ACETAMINOPHEN 650 MG RE SUPP: 650.0000 mg | RECTAL | Status: DC | PRN

## 2019-03-01 MED ORDER — SCOPOLAMINE 1 MG/3DAYS TD PT72
MEDICATED_PATCH | TRANSDERMAL | Status: DC | PRN
  Administered 2019-03-01: 1 via TRANSDERMAL

## 2019-03-01 MED ORDER — BUPIVACAINE HCL (PF) 0.25 % IJ SOLN
INTRAMUSCULAR | Status: DC | PRN
  Administered 2019-03-01: 10 mL

## 2019-03-01 MED ORDER — LIDOCAINE 2% (20 MG/ML) 5 ML SYRINGE
INTRAMUSCULAR | Status: DC | PRN
  Administered 2019-03-01: 40 mg via INTRAVENOUS

## 2019-03-01 MED ORDER — ROCURONIUM BROMIDE 50 MG/5ML IV SOSY
PREFILLED_SYRINGE | INTRAVENOUS | Status: DC | PRN
  Administered 2019-03-01: 50 mg via INTRAVENOUS

## 2019-03-01 MED ORDER — PHENYLEPHRINE 40 MCG/ML (10ML) SYRINGE FOR IV PUSH (FOR BLOOD PRESSURE SUPPORT)
PREFILLED_SYRINGE | INTRAVENOUS | Status: AC
  Filled 2019-03-01: qty 10

## 2019-03-01 MED ORDER — SODIUM CHLORIDE 0.9% FLUSH
3.0000 mL | Freq: Two times a day (BID) | INTRAVENOUS | Status: DC
  Administered 2019-03-01: 3 mL via INTRAVENOUS

## 2019-03-01 MED ORDER — HYDROMORPHONE HCL 1 MG/ML IJ SOLN
0.2500 mg | INTRAMUSCULAR | Status: DC | PRN
  Administered 2019-03-01 (×2): 0.5 mg via INTRAVENOUS

## 2019-03-01 MED ORDER — SUCCINYLCHOLINE CHLORIDE 200 MG/10ML IV SOSY
PREFILLED_SYRINGE | INTRAVENOUS | Status: DC | PRN
  Administered 2019-03-01: 140 mg via INTRAVENOUS

## 2019-03-01 MED ORDER — ACETAMINOPHEN 325 MG PO TABS: 650.0000 mg | ORAL_TABLET | ORAL | Status: DC | PRN

## 2019-03-01 MED ORDER — CEFAZOLIN SODIUM 1 G IJ SOLR
INTRAMUSCULAR | Status: AC
  Filled 2019-03-01: qty 20

## 2019-03-01 MED ORDER — LACTATED RINGERS IV SOLN: INTRAVENOUS | Status: DC

## 2019-03-01 MED ORDER — ONDANSETRON HCL 4 MG/2ML IJ SOLN
INTRAMUSCULAR | Status: DC | PRN
  Administered 2019-03-01: 4 mg via INTRAVENOUS

## 2019-03-01 MED ORDER — LIDOCAINE 2% (20 MG/ML) 5 ML SYRINGE
INTRAMUSCULAR | Status: AC
  Filled 2019-03-01: qty 5

## 2019-03-01 MED ORDER — MIDAZOLAM HCL 5 MG/5ML IJ SOLN
INTRAMUSCULAR | Status: DC | PRN
  Administered 2019-03-01: 2 mg via INTRAVENOUS

## 2019-03-01 MED ORDER — PROMETHAZINE HCL 25 MG/ML IJ SOLN: 6.2500 mg | INTRAMUSCULAR | Status: DC | PRN

## 2019-03-01 MED ORDER — PREDNISONE 10 MG PO TABS: 10.0000 mg | ORAL_TABLET | Freq: Every day | ORAL | Status: DC

## 2019-03-01 MED ORDER — MENTHOL 3 MG MT LOZG: 1.0000 | LOZENGE | OROMUCOSAL | Status: DC | PRN

## 2019-03-01 MED ORDER — LIDOCAINE-EPINEPHRINE 1 %-1:100000 IJ SOLN
INTRAMUSCULAR | Status: AC
  Filled 2019-03-01: qty 1

## 2019-03-01 MED ORDER — ONDANSETRON HCL 4 MG PO TABS: 4.0000 mg | ORAL_TABLET | Freq: Four times a day (QID) | ORAL | Status: DC | PRN

## 2019-03-01 MED ORDER — METHOCARBAMOL 500 MG PO TABS
ORAL_TABLET | ORAL | Status: AC
  Filled 2019-03-01: qty 1

## 2019-03-01 MED ORDER — ACETAMINOPHEN 160 MG/5ML PO SOLN: 325.0000 mg | Freq: Once | ORAL | Status: DC | PRN

## 2019-03-01 MED ORDER — ROCURONIUM BROMIDE 10 MG/ML (PF) SYRINGE
PREFILLED_SYRINGE | INTRAVENOUS | Status: AC
  Filled 2019-03-01: qty 10

## 2019-03-01 MED ORDER — PHENYLEPHRINE 40 MCG/ML (10ML) SYRINGE FOR IV PUSH (FOR BLOOD PRESSURE SUPPORT)
PREFILLED_SYRINGE | INTRAVENOUS | Status: DC | PRN
  Administered 2019-03-01: 120 ug via INTRAVENOUS
  Administered 2019-03-01: 80 ug via INTRAVENOUS

## 2019-03-01 MED ORDER — FENTANYL CITRATE (PF) 250 MCG/5ML IJ SOLN
INTRAMUSCULAR | Status: AC
  Filled 2019-03-01: qty 5

## 2019-03-01 MED ORDER — EPHEDRINE SULFATE-NACL 50-0.9 MG/10ML-% IV SOSY
PREFILLED_SYRINGE | INTRAVENOUS | Status: DC | PRN
  Administered 2019-03-01 (×2): 10 mg via INTRAVENOUS

## 2019-03-01 MED ORDER — OXYCODONE HCL 5 MG PO TABS
10.0000 mg | ORAL_TABLET | ORAL | Status: DC | PRN
  Administered 2019-03-01 (×3): 10 mg via ORAL
  Filled 2019-03-01 (×2): qty 2

## 2019-03-01 MED ORDER — HYDROMORPHONE HCL 1 MG/ML IJ SOLN
INTRAMUSCULAR | Status: AC
  Administered 2019-03-01: 0.5 mg via INTRAVENOUS
  Filled 2019-03-01: qty 1

## 2019-03-01 MED ORDER — LACTATED RINGERS IV SOLN: INTRAVENOUS | Status: DC | PRN

## 2019-03-01 MED ORDER — PROPOFOL 10 MG/ML IV BOLUS
INTRAVENOUS | Status: DC | PRN
  Administered 2019-03-01: 120 mg via INTRAVENOUS

## 2019-03-01 MED ORDER — OXYCODONE HCL 5 MG PO TABS
ORAL_TABLET | ORAL | Status: AC
  Filled 2019-03-01: qty 2

## 2019-03-01 MED ORDER — ONDANSETRON HCL 4 MG/2ML IJ SOLN
INTRAMUSCULAR | Status: AC
  Filled 2019-03-01: qty 2

## 2019-03-01 MED ORDER — HEMOSTATIC AGENTS (NO CHARGE) OPTIME
TOPICAL | Status: DC | PRN
  Administered 2019-03-01: 1 via TOPICAL

## 2019-03-01 MED ORDER — ACETAMINOPHEN 10 MG/ML IV SOLN: 1000.0000 mg | Freq: Once | INTRAVENOUS | Status: DC | PRN

## 2019-03-01 MED ORDER — PANTOPRAZOLE SODIUM 40 MG IV SOLR: 40.0000 mg | Freq: Every day | INTRAVENOUS | Status: DC

## 2019-03-01 MED ORDER — DEXAMETHASONE SODIUM PHOSPHATE 10 MG/ML IJ SOLN
INTRAMUSCULAR | Status: DC | PRN
  Administered 2019-03-01: 10 mg via INTRAVENOUS

## 2019-03-01 MED ORDER — ACETAMINOPHEN 500 MG PO TABS: 1000.0000 mg | ORAL_TABLET | Freq: Four times a day (QID) | ORAL | Status: DC | PRN

## 2019-03-01 MED ORDER — BACLOFEN 10 MG PO TABS
10.0000 mg | ORAL_TABLET | Freq: Three times a day (TID) | ORAL | Status: DC | PRN
  Administered 2019-03-01 – 2019-03-02 (×2): 10 mg via ORAL
  Filled 2019-03-01 (×2): qty 1

## 2019-03-01 MED ORDER — CYCLOBENZAPRINE HCL 10 MG PO TABS: 10.0000 mg | ORAL_TABLET | Freq: Three times a day (TID) | ORAL | Status: DC | PRN

## 2019-03-01 MED ORDER — HYDROMORPHONE HCL 1 MG/ML IJ SOLN: 0.2500 mg | INTRAMUSCULAR | Status: DC | PRN

## 2019-03-01 MED ORDER — PREDNISONE 10 MG PO TABS: 10.0000 mg | ORAL_TABLET | ORAL | Status: DC

## 2019-03-01 MED ORDER — MIDAZOLAM HCL 2 MG/2ML IJ SOLN
INTRAMUSCULAR | Status: AC
  Filled 2019-03-01: qty 2

## 2019-03-01 MED ORDER — METHOCARBAMOL 500 MG PO TABS
500.0000 mg | ORAL_TABLET | Freq: Two times a day (BID) | ORAL | Status: DC
  Administered 2019-03-01 – 2019-03-02 (×3): 500 mg via ORAL
  Filled 2019-03-01 (×4): qty 1

## 2019-03-01 MED ORDER — ACETAMINOPHEN 325 MG PO TABS: 325.0000 mg | ORAL_TABLET | Freq: Once | ORAL | Status: DC | PRN

## 2019-03-01 MED ORDER — SODIUM CHLORIDE 0.9% FLUSH: 3.0000 mL | INTRAVENOUS | Status: DC | PRN

## 2019-03-01 SURGICAL SUPPLY — 57 items
ADH SKN CLS APL DERMABOND .7 (GAUZE/BANDAGES/DRESSINGS) ×1
APL SKNCLS STERI-STRIP NONHPOA (GAUZE/BANDAGES/DRESSINGS) ×1
BAG DECANTER FOR FLEXI CONT (MISCELLANEOUS) ×2 IMPLANT
BENZOIN TINCTURE PRP APPL 2/3 (GAUZE/BANDAGES/DRESSINGS) ×2 IMPLANT
BLADE CLIPPER SURG (BLADE) IMPLANT
BLADE SURG 11 STRL SS (BLADE) ×2 IMPLANT
BUR CUTTER 7.0 ROUND (BURR) ×2 IMPLANT
BUR MATCHSTICK NEURO 3.0 LAGG (BURR) ×2 IMPLANT
CANISTER SUCT 3000ML PPV (MISCELLANEOUS) ×2 IMPLANT
CARTRIDGE OIL MAESTRO DRILL (MISCELLANEOUS) ×1 IMPLANT
COVER WAND RF STERILE (DRAPES) ×2 IMPLANT
DECANTER SPIKE VIAL GLASS SM (MISCELLANEOUS) ×2 IMPLANT
DERMABOND ADVANCED (GAUZE/BANDAGES/DRESSINGS) ×1
DERMABOND ADVANCED .7 DNX12 (GAUZE/BANDAGES/DRESSINGS) ×1 IMPLANT
DIFFUSER DRILL AIR PNEUMATIC (MISCELLANEOUS) ×2 IMPLANT
DRAPE HALF SHEET 40X57 (DRAPES) IMPLANT
DRAPE LAPAROTOMY 100X72X124 (DRAPES) ×2 IMPLANT
DRAPE MICROSCOPE LEICA (MISCELLANEOUS) ×2 IMPLANT
DRAPE SURG 17X23 STRL (DRAPES) ×2 IMPLANT
DRSG OPSITE POSTOP 4X6 (GAUZE/BANDAGES/DRESSINGS) ×1 IMPLANT
DURAPREP 26ML APPLICATOR (WOUND CARE) ×2 IMPLANT
ELECT REM PT RETURN 9FT ADLT (ELECTROSURGICAL) ×2
ELECTRODE REM PT RTRN 9FT ADLT (ELECTROSURGICAL) ×1 IMPLANT
GAUZE 4X4 16PLY RFD (DISPOSABLE) IMPLANT
GAUZE SPONGE 4X4 12PLY STRL (GAUZE/BANDAGES/DRESSINGS) ×2 IMPLANT
GLOVE BIO SURGEON STRL SZ7 (GLOVE) IMPLANT
GLOVE BIO SURGEON STRL SZ8 (GLOVE) ×1 IMPLANT
GLOVE BIOGEL PI IND STRL 7.0 (GLOVE) IMPLANT
GLOVE BIOGEL PI INDICATOR 7.0 (GLOVE)
GLOVE EXAM NITRILE XL STR (GLOVE) IMPLANT
GLOVE INDICATOR 8.5 STRL (GLOVE) ×1 IMPLANT
GLOVE SKINSENSE NS SZ8.0 LF (GLOVE) ×1
GLOVE SKINSENSE STRL SZ8.0 LF (GLOVE) IMPLANT
GLOVE SS N UNI LF 7.0 STRL (GLOVE) ×1 IMPLANT
GOWN STRL REUS W/ TWL LRG LVL3 (GOWN DISPOSABLE) ×1 IMPLANT
GOWN STRL REUS W/ TWL XL LVL3 (GOWN DISPOSABLE) ×2 IMPLANT
GOWN STRL REUS W/TWL 2XL LVL3 (GOWN DISPOSABLE) IMPLANT
GOWN STRL REUS W/TWL LRG LVL3 (GOWN DISPOSABLE) ×2
GOWN STRL REUS W/TWL XL LVL3 (GOWN DISPOSABLE) ×4
KIT BASIN OR (CUSTOM PROCEDURE TRAY) ×2 IMPLANT
KIT TURNOVER KIT B (KITS) ×2 IMPLANT
NDL SPNL 22GX3.5 QUINCKE BK (NEEDLE) ×1 IMPLANT
NEEDLE HYPO 22GX1.5 SAFETY (NEEDLE) ×2 IMPLANT
NEEDLE SPNL 22GX3.5 QUINCKE BK (NEEDLE) ×2 IMPLANT
NS IRRIG 1000ML POUR BTL (IV SOLUTION) ×2 IMPLANT
OIL CARTRIDGE MAESTRO DRILL (MISCELLANEOUS) ×2
PACK LAMINECTOMY NEURO (CUSTOM PROCEDURE TRAY) ×2 IMPLANT
RUBBERBAND STERILE (MISCELLANEOUS) ×4 IMPLANT
SPONGE SURGIFOAM ABS GEL SZ50 (HEMOSTASIS) ×2 IMPLANT
STRIP CLOSURE SKIN 1/2X4 (GAUZE/BANDAGES/DRESSINGS) ×2 IMPLANT
SUT VIC AB 0 CT1 18XCR BRD8 (SUTURE) ×1 IMPLANT
SUT VIC AB 0 CT1 8-18 (SUTURE) ×2
SUT VIC AB 2-0 CT1 18 (SUTURE) ×2 IMPLANT
SUT VICRYL 4-0 PS2 18IN ABS (SUTURE) ×2 IMPLANT
TOWEL GREEN STERILE (TOWEL DISPOSABLE) ×2 IMPLANT
TOWEL GREEN STERILE FF (TOWEL DISPOSABLE) ×2 IMPLANT
WATER STERILE IRR 1000ML POUR (IV SOLUTION) ×2 IMPLANT

## 2019-03-01 NOTE — Op Note (Signed)
Pre-Operative diagnosis: Herniated nucleus pulposus L4-5 left  Postoperative diagnosis: Same  Procedure: Lumbar laminectomy microdiscectomy L4-5 on the left with microdissection of the left L5 nerve root microscopic discectomy  Surgeon: Jillyn Hidden Yusif Gnau  Assistant: Julien Girt  Anesthesia: General  EBL: Minimal  HPI: 29 year old female presents with back and left leg pain rating down L5 nerve root pattern had slight weakness on dorsiflexion of her left foot due to her failed conservative treatment imaging findings and progression of clinical syndrome I recommended limited to microdiscectomy at L4-5 on the left.  I extensively reviewed the risks and benefits of that operation with her as well as perioperative course expectations of outcome and alternatives of surgery and she understands and agrees to proceed forward.  Operative procedure: Patient brought into the OR was induced under general anesthesia positioned prone the Wilson frame her back was prepped and draped in routine sterile fashion preoperative x-ray localized the appropriate level so after infiltration of 10 cc lidocaine with epi midline incision was made and Bovie electrocautery was used to take down the subcutaneous tissue and subperiosteal dissection was carried out on the lamina of L4 and L5 confirmed by intraoperative x-ray.  Utilizing high-speed drill inferior aspect of lamina of L4 medial facet complex superior aspect of the lamina of L5 was drilled down laminotomy was begun with a 3 and 4 movement Kerrison punch.  Ligament flavum was removed in piecemeal fashion and under microscopic illumination identified the L5 pedicle under bit the medial facet complex to identify the disc base dissecting the L5 nerve off a very large free fragment does still partially contained in the ligament annulotomy was made with low blade scalpel and several large fragments were removed from under the thecal sac and underneath the L5 nerve root.  1 large  fragment just inferior to the disc base.  Disc base was then entered several additional fragments removed from central compartment as well as lateral compartment the disc base of the space was cleaned out pituitary rongeurs and Epstein curettes.  At the end of discectomy there is no further stenosis a coronary dilator was easily passed out the foramen across the midline to confirm adequate decompression of thecal sac and left L5 nerve root.  Wound was then copiously irrigated meticulous hemostasis was maintained Gelfoam was overlaid top of dura the muscle fascia approximate layers with Vicryl skin was closed running 4 subcuticular Dermabond benzoin Steri-Strips and a sterile dressing was applied patient recovery room in stable condition.  At the end the case all needle count sponge counts were correct.

## 2019-03-01 NOTE — Transfer of Care (Signed)
Immediate Anesthesia Transfer of Care Note  Patient: Lisa Christian  Procedure(s) Performed: Microdiscectomy - Lumbar Four-Lumbar Five - left (Left Back)  Patient Location: PACU  Anesthesia Type:General  Level of Consciousness: drowsy and patient cooperative  Airway & Oxygen Therapy: Patient Spontanous Breathing  Post-op Assessment: Report given to RN and Post -op Vital signs reviewed and stable  Post vital signs: Reviewed and stable  Last Vitals:  Vitals Value Taken Time  BP 154/90 03/01/19 0900  Temp    Pulse 83 03/01/19 0900  Resp 16 03/01/19 0900  SpO2 98 % 03/01/19 0900  Vitals shown include unvalidated device data.  Last Pain:  Vitals:   03/01/19 0656  TempSrc:   PainSc: 7       Patients Stated Pain Goal: 3 (03/01/19 0254)  Complications: No apparent anesthesia complications

## 2019-03-01 NOTE — Anesthesia Preprocedure Evaluation (Addendum)
Anesthesia Evaluation  Patient identified by MRN, date of birth, ID band Patient awake    Reviewed: Allergy & Precautions, NPO status , Patient's Chart, lab work & pertinent test results  Airway Mallampati: III  TM Distance: >3 FB Neck ROM: Full    Dental  (+) Teeth Intact, Dental Advisory Given   Pulmonary neg pulmonary ROS,    breath sounds clear to auscultation       Cardiovascular negative cardio ROS   Rhythm:Regular Rate:Normal     Neuro/Psych  Headaches, Anxiety    GI/Hepatic Neg liver ROS, GERD  ,  Endo/Other  negative endocrine ROS  Renal/GU negative Renal ROS     Musculoskeletal negative musculoskeletal ROS (+)   Abdominal Normal abdominal exam  (+)   Peds  Hematology negative hematology ROS (+)   Anesthesia Other Findings   Reproductive/Obstetrics                            Anesthesia Physical Anesthesia Plan  ASA: II  Anesthesia Plan: General   Post-op Pain Management:    Induction: Intravenous  PONV Risk Score and Plan: 4 or greater and Ondansetron, Dexamethasone, Midazolam and Scopolamine patch - Pre-op  Airway Management Planned: Oral ETT  Additional Equipment: None  Intra-op Plan:   Post-operative Plan: Extubation in OR  Informed Consent: I have reviewed the patients History and Physical, chart, labs and discussed the procedure including the risks, benefits and alternatives for the proposed anesthesia with the patient or authorized representative who has indicated his/her understanding and acceptance.     Dental advisory given  Plan Discussed with: CRNA  Anesthesia Plan Comments:        Anesthesia Quick Evaluation

## 2019-03-01 NOTE — H&P (Signed)
Lisa Christian is an 29 y.o. female.   Chief Complaint: Back and left leg pain HPI: 29 year old female with progressive worsening back left leg pain weakness in her left foot.  Work-up revealed a very large disclamation L4-5 causing severe thecal sac compression of the left L5 nerve root compression.  Due to patient's progression of clinical syndrome imaging findings and failure of conservative treatment I recommended limited to microdiscectomy L4-5 on the left I extensively gone over the risks and benefits of that operation with her as well as perioperative course expectations of outcome and alternatives of surgery and she understands and agrees to proceed forward.  Past Medical History:  Diagnosis Date  . Anxiety   . Family history of adverse reaction to anesthesia    " not sure why second cousin can't get put to sleep."  . GERD (gastroesophageal reflux disease)   . Headache   . HNP (herniated nucleus pulposus), lumbar   . Panic attack   . Panic attacks   . Panic attacks   . Vitamin D deficiency     Past Surgical History:  Procedure Laterality Date  . BIOPSY N/A 01/11/2014   Procedure: BIOPSY;  Surgeon: Danie Binder, MD;  Location: [facility];  Service: Endoscopy;  Laterality: N/A;  duodenal and gastric  . ESOPHAGOGASTRODUODENOSCOPY (EGD) WITH PROPOFOL N/A 01/11/2014   [id number] non-erosive  . WISDOM TOOTH EXTRACTION      Family History  Problem Relation Age of Onset  . Anxiety disorder Mother   . Heart disease Maternal Grandfather   . Cancer Maternal Grandmother        breast  . Colon cancer Other    Social History:  reports that she has never smoked. She has never used smokeless tobacco. She reports previous alcohol use. She reports that she does not use drugs.  Allergies:  Allergies  Allergen Reactions  . Latex     Vaginal irritation    Medications Prior to Admission  Medication Sig Dispense Refill  . acetaminophen (TYLENOL) 500 MG tablet Take 2 tablets  (1,000 mg total) by mouth every 6 (six) hours as needed for mild pain or moderate pain. 21 tablet 0  . HYDROcodone-acetaminophen (NORCO/VICODIN) 5-325 MG tablet Take 1 tablet by mouth every 4 (four) hours as needed for moderate pain. 15 tablet 0  . methocarbamol (ROBAXIN) 500 MG tablet Take 1 tablet (500 mg total) by mouth 2 (two) times daily. 20 tablet 0  . oxyCODONE-acetaminophen (PERCOCET/ROXICET) 5-325 MG tablet Take 1 tablet by mouth every 4 (four) hours as needed for severe pain. 15 tablet 0  . predniSONE (DELTASONE) 20 MG tablet 3 tabs po daily x 3 days, then 2 tabs x 3 days, then 1.5 tabs x 3 days, then 1 tab x 3 days, then 0.5 tabs x 3 days (Patient taking differently: Take 10-60 mg by mouth See admin instructions. Take 60mg  daily x 3 days, then 40mg  daily x 3 days, then 30mg  daily x 3 days, then 20mg  daily x 3 days, then 10mg  daily  x 3 days) 27 tablet 0  . Vitamin D, Ergocalciferol, (DRISDOL) 1.25 MG (50000 UNIT) CAPS capsule Take 50,000 Units by mouth once a week.    . baclofen (LIORESAL) 10 MG tablet Take 10 mg by mouth 3 (three) times daily as needed for muscle spasms.    Marland Kitchen gabapentin (NEURONTIN) 300 MG capsule Take 300 mg by mouth at bedtime.    Marland Kitchen levonorgestrel (LILETTA, 52 MG,) 19.5 MCG/DAY IUD IUD 1 each by  Intrauterine route once.    . lidocaine (LIDODERM) 5 % Place 1 patch onto the skin daily. Remove & Discard patch within 12 hours or as directed by MD 30 patch 0    Results for orders placed or performed during the hospital encounter of 03/01/19 (from the past 48 hour(s))  Pregnancy, urine POC     Status: None   Collection Time: 03/01/19  7:08 AM  Result Value Ref Range   Preg Test, Ur NEGATIVE NEGATIVE    Comment:        THE SENSITIVITY OF THIS METHODOLOGY IS >24 mIU/mL    No results found.  Review of Systems  Musculoskeletal: Positive for back pain.  Neurological: Positive for weakness and numbness.    Blood pressure 119/80, pulse 69, temperature 98.3 F (36.8 C),  temperature source Oral, resp. rate 20, height 5\' 6"  (1.676 m), weight 86.2 kg, SpO2 96 %. Physical Exam  Constitutional: She is oriented to person, place, and time. She appears well-developed.  HENT:  Head: Normocephalic.  Eyes: Pupils are equal, round, and reactive to light.  Cardiovascular: Normal rate.  Respiratory: Effort normal.  GI: Soft.  Musculoskeletal:     Cervical back: Normal range of motion.  Neurological: She is alert and oriented to person, place, and time. She has normal strength. GCS eye subscore is 4. GCS verbal subscore is 5. GCS motor subscore is 6.  Awake and alert strength 4+ out of 5 left dorsiflexion and EHL otherwise 5 out of 5 lower extremities bilaterally  Skin: Skin is warm and dry.     Assessment/Plan 29 year old presents for L4-5 limited to microdiscectomy on the left.  Leelynd Maldonado P, MD 03/01/2019, 7:21 AM

## 2019-03-01 NOTE — Anesthesia Procedure Notes (Signed)
Procedure Name: Intubation Date/Time: 03/01/2019 7:40 AM Performed by: Rosiland Oz, CRNA Pre-anesthesia Checklist: Patient identified, Emergency Drugs available, Suction available, Patient being monitored and Timeout performed Patient Re-evaluated:Patient Re-evaluated prior to induction Oxygen Delivery Method: Circle system utilized Preoxygenation: Pre-oxygenation with 100% oxygen Induction Type: IV induction, Rapid sequence and Cricoid Pressure applied Laryngoscope Size: Miller and 2 Grade View: Grade I Tube type: Oral Tube size: 7.0 mm Number of attempts: 1 Airway Equipment and Method: Stylet Placement Confirmation: ETT inserted through vocal cords under direct vision,  positive ETCO2 and breath sounds checked- equal and bilateral Secured at: 21 cm Tube secured with: Tape Dental Injury: Teeth and Oropharynx as per pre-operative assessment

## 2019-03-01 NOTE — Anesthesia Postprocedure Evaluation (Signed)
Anesthesia Post Note  Patient: Lisa Christian  Procedure(s) Performed: Microdiscectomy - Lumbar Four-Lumbar Five - left (Left Back)     Patient location during evaluation: PACU Anesthesia Type: General Level of consciousness: awake and alert Pain management: pain level controlled Vital Signs Assessment: post-procedure vital signs reviewed and stable Respiratory status: spontaneous breathing, nonlabored ventilation, respiratory function stable and patient connected to nasal cannula oxygen Cardiovascular status: blood pressure returned to baseline and stable Postop Assessment: no apparent nausea or vomiting Anesthetic complications: no    Last Vitals:  Vitals:   03/01/19 1000 03/01/19 1025  BP: 131/85 125/78  Pulse: 75 62  Resp: 15 20  Temp: 36.7 C 36.9 C  SpO2: 96% 96%    Last Pain:  Vitals:   03/01/19 1025  TempSrc: Oral  PainSc:     LLE Motor Response: (P) Purposeful movement (03/01/19 1025) LLE Sensation: (P) Numbness (03/01/19 1025) RLE Motor Response: (P) Purposeful movement (03/01/19 1025) RLE Sensation: (P) Full sensation (03/01/19 1025)      Shelton Silvas

## 2019-03-02 DIAGNOSIS — M5126 Other intervertebral disc displacement, lumbar region: Secondary | ICD-10-CM | POA: Diagnosis not present

## 2019-03-02 NOTE — Progress Notes (Signed)
Pt given D/C instructions with verbal understanding. Rx's were sent to pharmacy by MD. Pt's incision is clean and dry with no sign of infection. Pt's IV was removed prior to D/C. Pt D/C'd home via wheelchair per MD order. Pt is stable @ D/C and has no other needs at this time. Nickcole Bralley, RN  

## 2019-03-02 NOTE — Discharge Instructions (Signed)

## 2019-03-02 NOTE — Discharge Summary (Signed)
Physician Discharge Summary  Patient ID: Lisa Christian MRN: [id number] DOB/AGE: [birth year] 29 y.o.  Admit date: 03/01/2019 Discharge date: 03/02/2019  Admission Diagnoses: Herniated nucleus pulposus L4-5 left   Discharge Diagnoses: same   Discharged Condition: good  Hospital Course: The patient was admitted on 03/01/2019 and taken to the operating room where the patient underwent microdiscectomy L4-5 Left. The patient tolerated the procedure well and was taken to the recovery room and then to the floor in stable condition. The hospital course was routine. There were no complications. The wound remained clean dry and intact. Pt had appropriate back soreness. No complaints of leg pain or new N/T/W. The patient remained afebrile with stable vital signs, and tolerated a regular diet. The patient continued to increase activities, and pain was well controlled with oral pain medications.   Consults: None  Significant Diagnostic Studies:  Results for orders placed or performed during the hospital encounter of 03/01/19  Basic metabolic panel  Result Value Ref Range   Sodium 139 135 - 145 mmol/L   Potassium 3.8 3.5 - 5.1 mmol/L   Chloride 103 98 - 111 mmol/L   CO2 25 22 - 32 mmol/L   Glucose, Bld 99 70 - 99 mg/dL   BUN 9 6 - 20 mg/dL   Creatinine, Ser 0.10 0.44 - 1.00 mg/dL   Calcium 9.3 8.9 - 27.2 mg/dL   GFR calc non Af Amer >60 >60 mL/min   GFR calc Af Amer >60 >60 mL/min   Anion gap 11 5 - 15  CBC  Result Value Ref Range   WBC 12.3 (H) 4.0 - 10.5 K/uL   RBC 4.09 3.87 - 5.11 MIL/uL   Hemoglobin 13.2 12.0 - 15.0 g/dL   HCT 53.6 64.4 - 03.4 %   MCV 95.8 80.0 - 100.0 fL   MCH 32.3 26.0 - 34.0 pg   MCHC 33.7 30.0 - 36.0 g/dL   RDW 74.2 59.5 - 63.8 %   Platelets 328 150 - 400 K/uL   nRBC 0.0 0.0 - 0.2 %  Pregnancy, urine POC  Result Value Ref Range   Preg Test, Ur NEGATIVE NEGATIVE    DG Lumbar Spine 2-3 Views  Result Date: 03/01/2019 CLINICAL DATA:  Localization films obtained in  operating room. EXAM: LUMBAR SPINE - 2-3 VIEW COMPARISON:  MRI 02/23/2019 FINDINGS: On the first film there is a surgical probe posterior to the L4-5 disc space. On the second film tissue spreader is identified posterior to L5. A surgical probe is also identified with tip posterior to the L5 vertebra IMPRESSION: Surgical probe localization of the L5 vertebra. Electronically Signed   By: Signa Kell M.D.   On: 03/01/2019 10:22   MR LUMBAR SPINE WO CONTRAST  Result Date: 02/23/2019 CLINICAL DATA:  Progressive low back pain for for over 6 weeks with numbness EXAM: MRI LUMBAR SPINE WITHOUT CONTRAST TECHNIQUE: Multiplanar, multisequence MR imaging of the lumbar spine was performed. No intravenous contrast was administered. COMPARISON:  None. FINDINGS: Segmentation: Transitional S1 vertebra with rudimentary S1-2 disc space. Alignment:  Normal Vertebrae:  No fracture, evidence of discitis, or bone lesion. Conus medullaris and cauda equina: Conus extends to the L1 level. Conus and cauda equina appear normal. Paraspinal and other soft tissues: Negative Disc levels: T12- L1: Unremarkable. L1-L2: Unremarkable. L2-L3: Unremarkable. L3-L4: Unremarkable. L4-L5: Disc narrowing and desiccation with left eccentric and inferior pointing disc extrusion causing high-grade spinal stenosis and asymmetric compression of the descending left L5 nerve root. The foramina are patent. L5-S1:Disc  narrowing and mild endplate degeneration with desiccation. Right eccentric protrusion which contacts the descending S1 nerve root. The foramina are patent. IMPRESSION: 1. L4-5 left eccentric extrusion with high-grade/compressive spinal stenosis. 2. L5-S1 right eccentric protrusion contacting the right S1 nerve root. Electronically Signed   By: Marnee Spring M.D.   On: 02/23/2019 05:03    Antibiotics:  Anti-infectives (From admission, onward)   Start     Dose/Rate Route Frequency Ordered Stop   03/01/19 1015  ceFAZolin (ANCEF) IVPB 2g/100  mL premix     2 g 200 mL/hr over 30 Minutes Intravenous Every 8 hours 03/01/19 1012 03/01/19 1926   03/01/19 0812  bacitracin 50,000 Units in sodium chloride 0.9 % 500 mL irrigation  Status:  Discontinued       As needed 03/01/19 0812 03/01/19 0855      Discharge Exam: Blood pressure (!) 90/56, pulse 64, temperature 98.5 F (36.9 C), temperature source Oral, resp. rate 18, height 5\' 6"  (1.676 m), weight 86.2 kg, SpO2 99 %. Neurologic: Grossly normal Ambulating and voiding well   Discharge Medications:   Allergies as of 03/02/2019      Reactions   Latex    Vaginal irritation      Medication List    TAKE these medications   acetaminophen 500 MG tablet Commonly known as: TYLENOL Take 2 tablets (1,000 mg total) by mouth every 6 (six) hours as needed for mild pain or moderate pain.   baclofen 10 MG tablet Commonly known as: LIORESAL Take 10 mg by mouth 3 (three) times daily as needed for muscle spasms.   gabapentin 300 MG capsule Commonly known as: NEURONTIN Take 300 mg by mouth at bedtime.   HYDROcodone-acetaminophen 5-325 MG tablet Commonly known as: NORCO/VICODIN Take 1 tablet by mouth every 4 (four) hours as needed for moderate pain.   lidocaine 5 % Commonly known as: Lidoderm Place 1 patch onto the skin daily. Remove & Discard patch within 12 hours or as directed by MD   Liletta (52 MG) 19.5 MCG/DAY Iud IUD Generic drug: levonorgestrel 1 each by Intrauterine route once.   methocarbamol 500 MG tablet Commonly known as: ROBAXIN Take 1 tablet (500 mg total) by mouth 2 (two) times daily.   oxyCODONE-acetaminophen 5-325 MG tablet Commonly known as: PERCOCET/ROXICET Take 1 tablet by mouth every 4 (four) hours as needed for severe pain.   predniSONE 20 MG tablet Commonly known as: DELTASONE 3 tabs po daily x 3 days, then 2 tabs x 3 days, then 1.5 tabs x 3 days, then 1 tab x 3 days, then 0.5 tabs x 3 days What changed:   how much to take  how to take this  when  to take this  additional instructions   Vitamin D (Ergocalciferol) 1.25 MG (50000 UNIT) Caps capsule Commonly known as: DRISDOL Take 50,000 Units by mouth once a week.       Disposition: home   Final Dx: microdiscectomy L4-5 left  Discharge Instructions     Remove dressing in 72 hours   Complete by: As directed    Call MD for:  difficulty breathing, headache or visual disturbances   Complete by: As directed    Call MD for:  hives   Complete by: As directed    Call MD for:  persistant dizziness or light-headedness   Complete by: As directed    Call MD for:  persistant nausea and vomiting   Complete by: As directed    Call MD for:  redness,  tenderness, or signs of infection (pain, swelling, redness, odor or green/yellow discharge around incision site)   Complete by: As directed    Call MD for:  severe uncontrolled pain   Complete by: As directed    Call MD for:  temperature >100.4   Complete by: As directed    Diet - low sodium heart healthy   Complete by: As directed    Driving Restrictions   Complete by: As directed    No driving for 2 weeks, no riding in the car for 1 week   Increase activity slowly   Complete by: As directed    Lifting restrictions   Complete by: As directed    No lifting more than 8 lbs         Signed: Ocie Cornfield Jayke Caul 03/02/2019, 8:50 AM

## 2019-06-29 ENCOUNTER — Encounter: Payer: Self-pay | Admitting: *Deleted

## 2019-06-30 ENCOUNTER — Ambulatory Visit: Payer: Medicaid Other | Admitting: Diagnostic Neuroimaging

## 2019-06-30 ENCOUNTER — Telehealth: Payer: Self-pay | Admitting: Diagnostic Neuroimaging

## 2019-06-30 ENCOUNTER — Encounter: Payer: Self-pay | Admitting: Diagnostic Neuroimaging

## 2019-06-30 VITALS — BP 111/73 | HR 79 | Ht 66.0 in | Wt 195.6 lb

## 2019-06-30 DIAGNOSIS — H4710 Unspecified papilledema: Secondary | ICD-10-CM

## 2019-06-30 NOTE — Patient Instructions (Signed)
OPTIC DISC EDEMA - check MRI brain - refer to retina specialist  HEADACHES (migraine vs IIH) - check LP after MRI - consider migraine treatments

## 2019-06-30 NOTE — Telephone Encounter (Signed)
Medicaid order sent to GI. They will obtain the auth and reach out to the patient to schedule.  

## 2019-06-30 NOTE — Progress Notes (Signed)
[facility]  PATIENT: Lisa Christian DOB: 1990/04/24  REFERRING CLINICIAN: Kaylyn Lim, OD HISTORY FROM: patient  REASON FOR VISIT: new consult    HISTORICAL  CHIEF COMPLAINT:  Chief Complaint  Patient presents with  . Swollen Optic Nerve    rm 7 New Pt    HISTORY OF PRESENT ILLNESS:   29 year old female here for evaluation of papilledema. Patient had routine eye exam was diagnosed with bilateral optic disc edema. She has been having headaches associated with nausea, photophobia, squeezing sensation, throbbing sensation. She has had some whooshing sound in her right ear. No transient visual obscuration. Patient has had some weight gain over the last 5 years from 150 pounds up to 220 pounds. She also had IUD placed last year.    REVIEW OF SYSTEMS: Full 14 system review of systems performed and negative with exception of: As per HPI.  ALLERGIES: Allergies  Allergen Reactions  . Latex     Vaginal irritation    HOME MEDICATIONS: Outpatient Medications Prior to Visit  Medication Sig Dispense Refill  . acetaminophen (TYLENOL) 500 MG tablet Take 2 tablets (1,000 mg total) by mouth every 6 (six) hours as needed for mild pain or moderate pain. 21 tablet 0  . levonorgestrel (LILETTA, 52 MG,) 19.5 MCG/DAY IUD IUD 1 each by Intrauterine route once.    . meloxicam (MOBIC) 7.5 MG tablet Take 7.5 mg by mouth as needed for pain.    . methocarbamol (ROBAXIN) 500 MG tablet Take 1 tablet (500 mg total) by mouth 2 (two) times daily. 20 tablet 0  . HYDROcodone-acetaminophen (NORCO/VICODIN) 5-325 MG tablet Take 1 tablet by mouth every 4 (four) hours as needed for moderate pain. (Patient not taking: Reported on 06/30/2019) 15 tablet 0  . oxyCODONE-acetaminophen (PERCOCET/ROXICET) 5-325 MG tablet Take 1 tablet by mouth every 4 (four) hours as needed for severe pain. (Patient not taking: Reported on 06/30/2019) 15 tablet 0  . Vitamin D, Ergocalciferol, (DRISDOL) 1.25 MG  (50000 UNIT) CAPS capsule Take 50,000 Units by mouth once a week.    . baclofen (LIORESAL) 10 MG tablet Take 10 mg by mouth 3 (three) times daily as needed for muscle spasms.    Marland Kitchen gabapentin (NEURONTIN) 300 MG capsule Take 300 mg by mouth at bedtime.    . lidocaine (LIDODERM) 5 % Place 1 patch onto the skin daily. Remove & Discard patch within 12 hours or as directed by MD 30 patch 0  . predniSONE (DELTASONE) 20 MG tablet 3 tabs po daily x 3 days, then 2 tabs x 3 days, then 1.5 tabs x 3 days, then 1 tab x 3 days, then 0.5 tabs x 3 days (Patient taking differently: Take 10-60 mg by mouth See admin instructions. Take 60mg  daily x 3 days, then 40mg  daily x 3 days, then 30mg  daily x 3 days, then 20mg  daily x 3 days, then 10mg  daily  x 3 days) 27 tablet 0   No facility-administered medications prior to visit.    PAST MEDICAL HISTORY: Past Medical History:  Diagnosis Date  . Anxiety   . Family history of adverse reaction to anesthesia    " not sure why second cousin can't get put to sleep."  . GERD (gastroesophageal reflux disease)   . Headache   . HNP (herniated nucleus pulposus), lumbar   . Panic attack   . Panic attacks   . Vitamin D deficiency     PAST SURGICAL HISTORY: Past Surgical History:  Procedure Laterality Date  .  BIOPSY N/A 01/11/2014   Procedure: BIOPSY;  Surgeon: West Bali, MD;  Location: [facility];  Service: Endoscopy;  Laterality: N/A;  duodenal and gastric  . ESOPHAGOGASTRODUODENOSCOPY (EGD) WITH PROPOFOL N/A 01/11/2014   [id number] non-erosive  . LUMBAR LAMINECTOMY/DECOMPRESSION MICRODISCECTOMY Left 03/01/2019   Procedure: Microdiscectomy - Lumbar Four-Lumbar Five - left;  Surgeon: Donalee Citrin, MD;  Location: [facility] OR;  Service: Neurosurgery;  Laterality: Left;  Microdiscectomy - Lumbar Four-Lumbar Five - left  . WISDOM TOOTH EXTRACTION      FAMILY HISTORY: Family History  Problem Relation Age of Onset  . Anxiety disorder Mother   . Heart disease Maternal  Grandfather   . Cancer Maternal Grandmother        breast  . Colon cancer Other     SOCIAL HISTORY: Social History   Socioeconomic History  . Marital status: Single    Spouse name: Not on file  . Number of children: 1  . Years of education: Not on file  . Highest education level: Associate degree: occupational, Scientist, product/process development, or vocational program  Occupational History  . Occupation: cosmetologist    Comment: at home  Tobacco Use  . Smoking status: Never Smoker  . Smokeless tobacco: Never Used  Substance and Sexual Activity  . Alcohol use: Not Currently  . Drug use: No  . Sexual activity: Yes    Birth control/protection: I.U.D.  Other Topics Concern  . Not on file  Social History Narrative   Lives with child   Social Determinants of Health   Financial Resource Strain:   . Difficulty of Paying Living Expenses:   Food Insecurity:   . Worried About Programme researcher, broadcasting/film/video in the Last Year:   . Barista in the Last Year:   Transportation Needs:   . Freight forwarder (Medical):   Marland Kitchen Lack of Transportation (Non-Medical):   Physical Activity:   . Days of Exercise per Week:   . Minutes of Exercise per Session:   Stress:   . Feeling of Stress :   Social Connections:   . Frequency of Communication with Friends and Family:   . Frequency of Social Gatherings with Friends and Family:   . Attends Religious Services:   . Active Member of Clubs or Organizations:   . Attends Banker Meetings:   Marland Kitchen Marital Status:   Intimate Partner Violence:   . Fear of Current or Ex-Partner:   . Emotionally Abused:   Marland Kitchen Physically Abused:   . Sexually Abused:      PHYSICAL EXAM  GENERAL EXAM/CONSTITUTIONAL: Vitals:  Vitals:   06/30/19 1112  BP: 111/73  Pulse: 79  Weight: 195 lb 9.6 oz (88.7 kg)  Height: 5\' 6"  (1.676 m)     Body mass index is 31.57 kg/m. Wt Readings from Last 3 Encounters:  06/30/19 195 lb 9.6 oz (88.7 kg)  03/01/19 190 lb (86.2 kg)   02/27/19 190 lb (86.2 kg)     Patient is in no distress; well developed, nourished and groomed; neck is supple  CARDIOVASCULAR:  Examination of carotid arteries is normal; no carotid bruits  Regular rate and rhythm, no murmurs  Examination of peripheral vascular system by observation and palpation is normal  EYES:  Ophthalmoscopic exam of optic discs and posterior segments is normal; no papilledema or hemorrhages  No exam data present  MUSCULOSKELETAL:  Gait, strength, tone, movements noted in Neurologic exam below  NEUROLOGIC: MENTAL STATUS:  No flowsheet data found.  awake, alert, oriented  to person, place and time  recent and remote memory intact  normal attention and concentration  language fluent, comprehension intact, naming intact  fund of knowledge appropriate  CRANIAL NERVE:   2nd - no papilledema on fundoscopic exam  2nd, 3rd, 4th, 6th - pupils equal and reactive to light, visual fields full to confrontation, extraocular muscles intact, no nystagmus  5th - facial sensation symmetric  7th - facial strength symmetric  8th - hearing intact  9th - palate elevates symmetrically, uvula midline  11th - shoulder shrug symmetric  12th - tongue protrusion midline  MOTOR:   normal bulk and tone, full strength in the BUE, BLE  SENSORY:   normal and symmetric to light touch, temperature, vibration  COORDINATION:   finger-nose-finger, fine finger movements normal  REFLEXES:   deep tendon reflexes present and symmetric  GAIT/STATION:   narrow based gait     DIAGNOSTIC DATA (LABS, IMAGING, TESTING) - I reviewed patient records, labs, notes, testing and imaging myself where available.  Lab Results  Component Value Date   WBC 12.3 (H) 03/01/2019   HGB 13.2 03/01/2019   HCT 39.2 03/01/2019   MCV 95.8 03/01/2019   PLT 328 03/01/2019      Component Value Date/Time   NA 139 03/01/2019 0637   NA 138 02/02/2018 1531   K 3.8 03/01/2019  0637   CL 103 03/01/2019 0637   CO2 25 03/01/2019 0637   GLUCOSE 99 03/01/2019 0637   BUN 9 03/01/2019 0637   BUN 6 02/02/2018 1531   CREATININE 0.56 03/01/2019 0637   CALCIUM 9.3 03/01/2019 0637   PROT 7.0 02/02/2018 1531   ALBUMIN 4.4 02/02/2018 1531   AST 18 02/02/2018 1531   ALT 17 02/02/2018 1531   ALKPHOS 63 02/02/2018 1531   BILITOT 0.4 02/02/2018 1531   GFRNONAA >60 03/01/2019 0637   GFRAA >60 03/01/2019 1610   No results found for: CHOL, HDL, LDLCALC, LDLDIRECT, TRIG, CHOLHDL No results found for: HGBA1C No results found for: VITAMINB12 Lab Results  Component Value Date   TSH 3.100 02/02/2018       ASSESSMENT AND PLAN  29 y.o. 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 Also with headaches with migraine features. We will proceed with further work-up and evaluation.  Dx:  1. Optic disc edema     PLAN:  OPTIC DISC EDEMA (? IIH, inflamm, mass, pseudo-papilledema / drusen) - check MRI brain - refer to retina specialist  HEADACHES (migraine vs IIH) - check LP after MRI - consider migraine treatments  Orders Placed This Encounter  Procedures  . MR BRAIN W WO CONTRAST  . Ambulatory referral to Ophthalmology   Return in about 6 months (around 12/30/2019).    Penni Bombard, MD 09/26/452, 09:81 AM Certified in Neurology, Neurophysiology and Neuroimaging  [facility] [address], [place], [place] [address] [phone]

## 2019-07-12 ENCOUNTER — Other Ambulatory Visit: Payer: Self-pay

## 2019-07-12 ENCOUNTER — Ambulatory Visit (HOSPITAL_COMMUNITY): Payer: Medicaid Other | Attending: Neurosurgery | Admitting: Physical Therapy

## 2019-07-12 DIAGNOSIS — M6281 Muscle weakness (generalized): Secondary | ICD-10-CM | POA: Insufficient documentation

## 2019-07-12 DIAGNOSIS — M5442 Lumbago with sciatica, left side: Secondary | ICD-10-CM | POA: Diagnosis present

## 2019-07-12 DIAGNOSIS — G8929 Other chronic pain: Secondary | ICD-10-CM | POA: Insufficient documentation

## 2019-07-12 NOTE — Therapy (Signed)
[place], [place], [address] Phone: [phone]   Fax:  [phone]  Physical Therapy Evaluation  Patient Details  Name: Lisa Christian MRN: [id number] Date of Birth: 1990/05/28 Referring Provider (PT): Kary Kos MD   Encounter Date: 07/12/2019   PT End of Session - 07/12/19 0930    Visit Number 1    Number of Visits 16    Date for PT Re-Evaluation 09/06/19    Authorization Type medicaid - check auth    Authorization - Visit Number 1    Authorization - Number of Visits 1    Progress Note Due on Visit 10    PT Start Time 0930   pt 15 minutes late to session   PT Stop Time 1015    PT Time Calculation (min) 45 min           Past Medical History:  Diagnosis Date  . Anxiety   . Family history of adverse reaction to anesthesia    " not sure why second cousin can't get put to sleep."  . GERD (gastroesophageal reflux disease)   . Headache   . HNP (herniated nucleus pulposus), lumbar   . Panic attack   . Panic attacks   . Vitamin D deficiency     Past Surgical History:  Procedure Laterality Date  . BIOPSY N/A 01/11/2014   Procedure: BIOPSY;  Surgeon: Danie Binder, MD;  Location: [facility];  Service: Endoscopy;  Laterality: N/A;  duodenal and gastric  . ESOPHAGOGASTRODUODENOSCOPY (EGD) WITH PROPOFOL N/A 01/11/2014   GGY:IRSWNIOEV/OJJK non-erosive  . LUMBAR LAMINECTOMY/DECOMPRESSION MICRODISCECTOMY Left 03/01/2019   Procedure: Microdiscectomy - Lumbar Four-Lumbar Five - left;  Surgeon: Kary Kos, MD;  Location: [facility];  Service: Neurosurgery;  Laterality: Left;  Microdiscectomy - Lumbar Four-Lumbar Five - left  . WISDOM TOOTH EXTRACTION      There were no vitals filed for this visit.    Subjective Assessment - 07/12/19 0932    Subjective States she had surgery in February. Still is having some of the nerve pain she was having before. States it is not as bad prior to surgery. States prior to surgery she was  having left leg pain and now it is in both legs. States that it's not constant, states that she is under the impression she is not supposed to lift bend or twist. 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 Current pain is 3/10 pretty constant in the legs. Back feels worse. States she has dealt with her whole life. States that laying down feels good, standing and sitting bother her. Walking is better then sitting and standing. Hard surfaces are really painful on her back. Massage helps with her pain    Currently in Pain? Yes    Pain Score 3     Pain Location Back    Pain Orientation Lower;Mid    Pain Descriptors / Indicators Aching    Pain Type Chronic pain    Pain Radiating Towards down into leg - into hips    Pain Onset More than a month ago    Pain Frequency Constant    Aggravating Factors  sititng, standing    Pain Relieving Factors laying down, meloxicam              [facility] PT Assessment - 07/12/19 0001      Assessment   Medical Diagnosis lumbar pain  Referring Provider (PT) Donalee Citrin MD    Onset Date/Surgical Date 03/01/19    Prior Therapy yes - 2 visits for low back prior to surgery       Precautions   Precautions None      Restrictions   Weight Bearing Restrictions No      Balance Screen   Has the patient fallen in the past 6 months Yes    How many times? 1   due to pain    Has the patient had a decrease in activity level because of a fear of falling?  Yes      Home Environment   Living Environment Private residence    Available Help at Discharge Family;Friend(s)    Type of Home House    Home Access Stairs to enter    Entrance Stairs-Number of Steps 3    Home Layout One level    Additional Comments no difficulty with steps       Cognition   Overall Cognitive Status Within Functional Limits for tasks assessed      Observation/Other Assessments   Other Surveys  Oswestry Disability Index    Oswestry  Disability Index  26/50 --> 52% disability      Posture/Postural Control   Posture Comments sacral sititng      ROM / Strength   AROM / PROM / Strength AROM;Strength      AROM   AROM Assessment Site Lumbar;Hip    Right/Left Hip Right;Left    Right Hip Flexion 110   hesistant to move and painful    Right Hip External Rotation  45    Right Hip Internal Rotation  30    Left Hip Flexion 110   hesistant to move and painful    Left Hip External Rotation  45    Left Hip Internal Rotation  30    Lumbar Flexion 75% limited    hesistant to move and painful    Lumbar Extension 100% limited   hesistant to move and painful    Lumbar - Right Side Bend 50% limited   hesistant to move and painful    Lumbar - Left Side Bend 75% limited   hesistant to move and painful    Lumbar - Right Rotation 100% limited   hesistant to move and painful    Lumbar - Left Rotation 100% limited   hesistant to move and painful      Strength   Strength Assessment Site Hip;Knee;Ankle    Right/Left Hip Left;Right    Right Hip Flexion 4/5    Left Hip Flexion 4+/5    Right/Left Knee Right;Left    Right Knee Flexion 3+/5    Right Knee Extension 4+/5    Left Knee Flexion 3+/5    Left Knee Extension 4+/5    Right/Left Ankle Right;Left    Right Ankle Dorsiflexion 4+/5    Left Ankle Dorsiflexion 4+/5                      Objective measurements completed on examination: See above findings.       [facility] Adult PT Treatment/Exercise - 07/12/19 0001      Exercises   Exercises Knee/Hip      Knee/Hip Exercises: Stretches   Other Knee/Hip Stretches SKC with towel x5 15" holds B; lumbar rotation in supine - x20 B                   PT Education - 07/12/19  [id number]    Education Details in current condition, in post op healing, in gentle motion and muscle activation.    Person(s) Educated Patient    Methods Explanation    Comprehension Verbalized understanding            PT Short Term Goals -  07/12/19 1017      PT SHORT TERM GOAL #1   Title Patient will be independent in self management strategies to improve quality of life and functional outcomes.    Time 4    Period Weeks    Status New    Target Date 08/09/19      PT SHORT TERM GOAL #2   Title Patient will report at least 25% improvement in overall symptoms and/or function to demonstrate improved functional mobility    Time 4    Period Weeks    Status New    Target Date 08/09/19             PT Long Term Goals - 07/12/19 1022      PT LONG TERM GOAL #1   Title Patient will be able to demonstrated at least 50% ROM in all directions of lumbar spine to demonstrate improved lumbar mobility    Time 8    Period Weeks    Status New    Target Date 09/06/19      PT LONG TERM GOAL #2   Title Patient will report at least 50% improvement in overall symptoms and/or function to demonstrate improved functional mobility    Time 8    Period Weeks    Status New    Target Date 09/06/19      PT LONG TERM GOAL #3   Title Patient will score with < 43% on Oswesry Low back index to demonstrate clinically improved change in overall back disability.    Time 8    Period Weeks    Status New    Target Date 09/06/19                  Plan - 07/12/19 1014    Clinical Impression Statement Patient presents to therapy with complaints of chronic low back pain. Pain recently progressed over the last 5 months and elected to undergo left microdiscectomy at L4/5. Patient now presents with less severe pain but continued patient that now radiates into both lower legs instead of just her left. She is very hesitant and fearful of moving as she fears causing further injury to her back. Session focused on education and patient tolerated unloaded exercises well with no increase in symptoms. Patient would benefit from skilled physical therapy to improve functional mobility and return her to optimal function.    Personal Factors and Comorbidities  Comorbidity 1    Comorbidities chronic low back pain    Examination-Activity Limitations Bathing;Bed Mobility;Bend;Caring for Others;Lift;Locomotion Level;Stand;Squat;Sit    Examination-Participation Restrictions Cleaning;Community Activity;Driving;Meal Prep;Laundry;Yard Work;Shop    Stability/Clinical Decision Making Stable/Uncomplicated    Clinical Decision Making Low    Rehab Potential Good    PT Frequency 2x / week    PT Duration 8 weeks    PT Treatment/Interventions ADLs/Self Care Home Management;Aquatic Therapy;Cryotherapy;Electrical Stimulation;Iontophoresis 4mg /ml Dexamethasone;Moist Heat;Traction;Balance training;Therapeutic exercise;Therapeutic activities;Functional mobility training;Stair training;Gait training;DME Instruction;Neuromuscular re-education;Patient/family education;Manual techniques;Dry needling;Passive range of motion;Joint Manipulations    PT Next Visit Plan floor transfers-per patient request to get up/off ground while at the beach, gravity eliminated lumbar/hip mobility, TRA activation    PT Home Exercise Plan 6/21 SKCs, lumbar rotation  Consulted and Agree with Plan of Care Patient           Patient will benefit from skilled therapeutic intervention in order to improve the following deficits and impairments:  Pain, Decreased range of motion, Difficulty walking, Decreased activity tolerance, Decreased strength, Decreased endurance, Improper body mechanics, Postural dysfunction  Visit Diagnosis: Chronic left-sided low back pain with left-sided sciatica  Muscle weakness (generalized)     Problem List Patient Active Problem List   Diagnosis Date Noted  . HNP (herniated nucleus pulposus), lumbar 03/01/2019  . Encounter for IUD insertion 02/24/2018  . Shoulder dystocia during labor and delivery, delivered 02/14/2015  . CIN I (cervical intraepithelial neoplasia I) 04/25/2014  . GERD (gastroesophageal reflux disease) 11/24/2013  . Globus hystericus  11/24/2013  . Regurgitation 11/24/2013   10:30 AM, 07/12/19 Tereasa Coop, DPT Physical Therapy with [facility]  [phone] office  [facility] [address] [place], [place], [address] Phone: [phone]   Fax:  [phone]  Name: Lisa Christian MRN: [id number] Date of Birth: July 04, 1990

## 2019-07-19 ENCOUNTER — Other Ambulatory Visit: Payer: Self-pay

## 2019-07-19 ENCOUNTER — Ambulatory Visit (HOSPITAL_COMMUNITY): Payer: Medicaid Other | Admitting: Physical Therapy

## 2019-07-19 DIAGNOSIS — M5442 Lumbago with sciatica, left side: Secondary | ICD-10-CM | POA: Diagnosis not present

## 2019-07-19 DIAGNOSIS — M6281 Muscle weakness (generalized): Secondary | ICD-10-CM

## 2019-07-19 DIAGNOSIS — G8929 Other chronic pain: Secondary | ICD-10-CM

## 2019-07-19 NOTE — Therapy (Signed)
[facility] [address] [place], [place], [address] Phone: [phone]   Fax:  [phone]  Physical Therapy Treatment  Patient Details  Name: Lisa Christian MRN: [id number] Date of Birth: 10-19-1990 Referring Provider (PT): Donalee Citrin MD   Encounter Date: 07/19/2019   PT End of Session - 07/19/19 1012    Visit Number 2    Number of Visits 16    Date for PT Re-Evaluation 09/06/19    Authorization Type medicaid - check auth    Authorization Time Period patient approved3 units from 6/24 to 7/3    Authorization - Visit Number 1    Authorization - Number of Visits 3    Progress Note Due on Visit 10    PT Start Time 1013   pt 11 minutes late to session   PT Stop Time 1040    PT Time Calculation (min) 27 min           Past Medical History:  Diagnosis Date  . Anxiety   . Family history of adverse reaction to anesthesia    " not sure why second cousin can't get put to sleep."  . GERD (gastroesophageal reflux disease)   . Headache   . HNP (herniated nucleus pulposus), lumbar   . Panic attack   . Panic attacks   . Vitamin D deficiency     Past Surgical History:  Procedure Laterality Date  . BIOPSY N/A 01/11/2014   Procedure: BIOPSY;  Surgeon: West Bali, MD;  Location: [facility];  Service: Endoscopy;  Laterality: N/A;  duodenal and gastric  . ESOPHAGOGASTRODUODENOSCOPY (EGD) WITH PROPOFOL N/A 01/11/2014   ERD:[id number]/HUDJ non-erosive  . LUMBAR LAMINECTOMY/DECOMPRESSION MICRODISCECTOMY Left 03/01/2019   Procedure: Microdiscectomy - Lumbar Four-Lumbar Five - left;  Surgeon: Donalee Citrin, MD;  Location: [facility];  Service: Neurosurgery;  Laterality: Left;  Microdiscectomy - Lumbar Four-Lumbar Five - left  . WISDOM TOOTH EXTRACTION      There were no vitals filed for this visit.   Subjective Assessment - 07/19/19 1015    Subjective States she did the exercises about 2 days and then she got numbness in her feet that she hasn't had in  a while and that scared her so she stopped them.    Currently in Pain? Yes    Pain Score 4     Pain Location Back    Pain Orientation Lower;Mid    Pain Descriptors / Indicators Aching    Pain Type Chronic pain              [facility] PT Assessment - 07/19/19 0001      Assessment   Medical Diagnosis lumbar pain     Referring Provider (PT) Donalee Citrin MD    Onset Date/Surgical Date 03/01/19                         [facility] Adult PT Treatment/Exercise - 07/19/19 0001      Transfers   Transfers Floor to Transfer    Floor to Transfer 6: Modified independent (Device/Increase time);Other (comment)    Transfer via Lift Equipment Other/comments    Comments different ways to get up safely with first UE assist then no assist - 15 minutes       Knee/Hip Exercises: Supine   Other Supine Knee/Hip Exercises TRA activation in supine 10 minutes of practice  PT Short Term Goals - 07/12/19 1017      PT SHORT TERM GOAL #1   Title Patient will be independent in self management strategies to improve quality of life and functional outcomes.    Time 4    Period Weeks    Status New    Target Date 08/09/19      PT SHORT TERM GOAL #2   Title Patient will report at least 25% improvement in overall symptoms and/or function to demonstrate improved functional mobility    Time 4    Period Weeks    Status New    Target Date 08/09/19             PT Long Term Goals - 07/12/19 1022      PT LONG TERM GOAL #1   Title Patient will be able to demonstrated at least 50% ROM in all directions of lumbar spine to demonstrate improved lumbar mobility    Time 8    Period Weeks    Status New    Target Date 09/06/19      PT LONG TERM GOAL #2   Title Patient will report at least 50% improvement in overall symptoms and/or function to demonstrate improved functional mobility    Time 8    Period Weeks    Status New    Target Date 09/06/19      PT LONG TERM GOAL #3     Title Patient will score with < 43% on Oswesry Low back index to demonstrate clinically improved change in overall back disability.    Time 8    Period Weeks    Status New    Target Date 09/06/19                 Plan - 07/19/19 1016    Clinical Impression Statement Focused on floor transfers secondary to upcoming trip to the beach. Tolerated this well but fatigue noted. Patient very fearful of doing something wrong and hurting herself. Reassured patient. Added core activation exercise, minor discomfort noted in tailbone but she had previously brought this up with her MD and he said it was normal. Will continue to progress strength and ROM as tolerated.    Personal Factors and Comorbidities Comorbidity 1    Comorbidities chronic low back pain    Examination-Activity Limitations Bathing;Bed Mobility;Bend;Caring for Others;Lift;Locomotion Level;Stand;Squat;Sit    Examination-Participation Restrictions Cleaning;Community Activity;Driving;Meal Prep;Laundry;Yard Work;Shop    Stability/Clinical Decision Making Stable/Uncomplicated    Rehab Potential Good    PT Frequency 2x / week    PT Duration 8 weeks    PT Treatment/Interventions ADLs/Self Care Home Management;Aquatic Therapy;Cryotherapy;Electrical Stimulation;Iontophoresis 4mg /ml Dexamethasone;Moist Heat;Traction;Balance training;Therapeutic exercise;Therapeutic activities;Functional mobility training;Stair training;Gait training;DME Instruction;Neuromuscular re-education;Patient/family education;Manual techniques;Dry needling;Passive range of motion;Joint Manipulations    PT Next Visit Plan gravity eliminated lumbar/hip mobility, TRA activation, LE strengthening    PT Home Exercise Plan 6/21 SKCs, lumbar rotation; 6/28 floor transfers, TRA activtation    Consulted and Agree with Plan of Care Patient           Patient will benefit from skilled therapeutic intervention in order to improve the following deficits and impairments:  Pain,  Decreased range of motion, Difficulty walking, Decreased activity tolerance, Decreased strength, Decreased endurance, Improper body mechanics, Postural dysfunction  Visit Diagnosis: Chronic left-sided low back pain with left-sided sciatica  Muscle weakness (generalized)     Problem List Patient Active Problem List   Diagnosis Date Noted  . HNP (herniated nucleus pulposus), lumbar 03/01/2019  .  Encounter for IUD insertion 02/24/2018  . Shoulder dystocia during labor and delivery, delivered 02/14/2015  . CIN I (cervical intraepithelial neoplasia I) 04/25/2014  . GERD (gastroesophageal reflux disease) 11/24/2013  . 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 Regurgitation 11/24/2013    10:43 AM, 07/19/19 [name], DPT Physical Therapy with [facility]  [phone] office  [place] [address] [place], [place], [address] Phone: [phone]   Fax:  [phone]  Name: Lisa Christian MRN: [id number] Date of Birth: [birth year]

## 2019-07-21 ENCOUNTER — Encounter (HOSPITAL_COMMUNITY): Payer: Self-pay

## 2019-07-21 ENCOUNTER — Ambulatory Visit (HOSPITAL_COMMUNITY): Payer: Medicaid Other

## 2019-07-21 ENCOUNTER — Other Ambulatory Visit: Payer: Self-pay

## 2019-07-21 DIAGNOSIS — M5442 Lumbago with sciatica, left side: Secondary | ICD-10-CM

## 2019-07-21 DIAGNOSIS — M6281 Muscle weakness (generalized): Secondary | ICD-10-CM

## 2019-07-21 NOTE — Patient Instructions (Signed)
Isometric Abdominal Contraction    Tuck in stomach muscles and push low back against back of chair. Pair abdominal tightness with exhalation. Hold 5 seconds while counting out loud. Repeat 10 times. Do 3 sessions per day.  http://gt2.exer.us/624   Copyright  VHI. All rights reserved.

## 2019-07-21 NOTE — Therapy (Signed)
[facility] [address] [place], [place], [address] Phone: [phone]   Fax:  [phone]  Physical Therapy Treatment  Patient Details  Name: Lisa Christian MRN: [id number] Date of Birth: Sep 11, 1990 Referring Provider (PT): Donalee Citrin MD   Encounter Date: 07/21/2019   PT End of Session - 07/21/19 0935    Visit Number 3    Number of Visits 16    Date for PT Re-Evaluation 09/06/19    Authorization Type medicaid - check auth    Authorization Time Period patient approved3 units from 6/24 to 7/3    Authorization - Visit Number 2    Authorization - Number of Visits 3    Progress Note Due on Visit 10    PT Start Time 0922    PT Stop Time 1003    PT Time Calculation (min) 41 min    Activity Tolerance Patient tolerated treatment well   Fearful of movements/prone positoin          Past Medical History:  Diagnosis Date  . Anxiety   . Family history of adverse reaction to anesthesia    " not sure why second cousin can't get put to sleep."  . GERD (gastroesophageal reflux disease)   . Headache   . HNP (herniated nucleus pulposus), lumbar   . Panic attack   . Panic attacks   . Vitamin D deficiency     Past Surgical History:  Procedure Laterality Date  . BIOPSY N/A 01/11/2014   Procedure: BIOPSY;  Surgeon: West Bali, MD;  Location: [facility];  Service: Endoscopy;  Laterality: N/A;  duodenal and gastric  . ESOPHAGOGASTRODUODENOSCOPY (EGD) WITH PROPOFOL N/A 01/11/2014   DQQ:[id number]/JHER non-erosive  . LUMBAR LAMINECTOMY/DECOMPRESSION MICRODISCECTOMY Left 03/01/2019   Procedure: Microdiscectomy - Lumbar Four-Lumbar Five - left;  Surgeon: Donalee Citrin, MD;  Location: [facility] OR;  Service: Neurosurgery;  Laterality: Left;  Microdiscectomy - Lumbar Four-Lumbar Five - left  . WISDOM TOOTH EXTRACTION      There were no vitals filed for this visit.   Subjective Assessment - 07/21/19 0925    Subjective Pt stated she is sore following last apt.   Feels confident with floor to standing prior to going to beach.  Current pain scale LBP 4/10, no nerve pain this morning.  Reports the nerve pain comes and goes.    Currently in Pain? Yes    Pain Score 4     Pain Location Back    Pain Orientation Lower    Pain Descriptors / Indicators Aching    Pain Type Chronic pain    Pain Onset More than a month ago    Pain Frequency Constant    Aggravating Factors  sitting, standing    Pain Relieving Factors laying down, meloxicam              [facility] PT Assessment - 07/21/19 0001      Assessment   Medical Diagnosis lumbar pain     Referring Provider (PT) Donalee Citrin MD    Onset Date/Surgical Date 03/01/19    Prior Therapy yes - 2 visits for low back prior to surgery       Precautions   Precautions None      AROM   AROM Assessment Site Lumbar;Hip    Right/Left Hip Right;Left    Right Hip Flexion 110   was 110   Right Hip External Rotation  --   was 45   Right Hip Internal Rotation  --  was 30   Left Hip Flexion 108   was 110   Left Hip External Rotation  --   was 45   Left Hip Internal Rotation  --   was 30   Lumbar Flexion 75% limited   was 75% limited   Lumbar Extension 100% limited    was 100% limited c/o being stuck fearful of other disc isses   Lumbar - Right Side Bend 50%   was 50% limited   Lumbar - Left Side Bend 50% limited   was 75% limited   Lumbar - Right Rotation 90% limited c/o pulling   was 100% limited   Lumbar - Left Rotation 90% limited c/o pulling   was 100% limited     Strength   Strength Assessment Site Hip;Knee;Ankle    Right/Left Hip Right;Left    Right Hip Flexion 4/5   was 4/5   Right Hip Extension 3+/5   complete in seated due to fear of prone   Right Hip ABduction 4/5    Left Hip Flexion 4/5   was 4+/5   Left Hip Extension 3+/5   complete in seated due to fear of prone   Left Hip ABduction 4/5    Right/Left Knee Right;Left    Right Knee Flexion 3+/5   was 3+/5 seated positoin due to fear of prone    Right Knee Extension 4/5   was 4+   Left Knee Flexion 3+/5   was 3+/5 seated positoin due to fear of prone   Left Knee Extension 4/5   was 4+   Right/Left Ankle Right;Left    Right Ankle Dorsiflexion --   was 4+   Left Ankle Dorsiflexion --   was 4+                        [facility] Adult PT Treatment/Exercise - 07/21/19 0001      Transfers   Transfers Floor to Transfer    Floor to Transfer 6: Modified independent (Device/Increase time);Other (comment)    Transfer via Lift Equipment Other/comments    Comments different ways to get up safely with first UE assist then no assist - 15 minutes       Knee/Hip Exercises: Stretches   Other Knee/Hip Stretches LTR 5x 10"      Knee/Hip Exercises: Supine   Other Supine Knee/Hip Exercises TRA activation in supine 10 minutes of practice    Other Supine Knee/Hip Exercises Iso core paired with breathing      Knee/Hip Exercises: Prone   Other Prone Exercises quadruped TRA activation 5x 5"                  PT Education - 07/21/19 1236    Education Details Discussed importance of core strengthening for pain control, body mechanics    Person(s) Educated Patient    Methods Explanation    Comprehension Verbalized understanding            PT Short Term Goals - 07/21/19 0936      PT SHORT TERM GOAL #1   Title Patient will be independent in self management strategies to improve quality of life and functional outcomes.    Baseline 07/21/19:  Reports compliance with HEP 1x daily.      PT SHORT TERM GOAL #2   Title Patient will report at least 25% improvement in overall symptoms and/or function to demonstrate improved functional mobility    Baseline 07/21/19:  20% improvements Reports improvements with  knowledge of mechanics from floor to standing, as well as importance of core strengthening as well as s/s.             PT Long Term Goals - 07/21/19 0951      PT LONG TERM GOAL #1   Title Patient will be able to  demonstrated at least 50% ROM in all directions of lumbar spine to demonstrate improved lumbar mobility    Status On-going      PT LONG TERM GOAL #2   Title Patient will report at least 50% improvement in overall symptoms and/or function to demonstrate improved functional mobility    Baseline 07/21/19:  20% improvements Reports improvements with knowledge of mechanics from floor to standing, as well as importance of core strengthening as well as s/s.                 Plan - 07/21/19 1206    Clinical Impression Statement Pt continues to be fearful of movements due to fear of injury.  Reviewed goals this session for Medicaid re-authorizaiton.  Pt reports she has been compliant with HEP daily and feels she has improved 20% with improved knowledge of body mechanics and importance of core strengtheing.  Pt fearful of prone exercise but tolerated TRA activation training in quadruped position.  Verbal and tactile cueing to improve activatoin with core muscualture and educated importance of not holding breath.  Pt continues to demonstrate hip and core weakness.  Will continue to benefit from therapy to progress core strengtheing and improve lumbar mobility.    Personal Factors and Comorbidities Comorbidity 1    Comorbidities chronic low back pain    Examination-Activity Limitations Bathing;Bed Mobility;Bend;Caring for Others;Lift;Locomotion Level;Stand;Squat;Sit    Examination-Participation Restrictions Cleaning;Community Activity;Driving;Meal Prep;Laundry;Yard Work;Shop    Stability/Clinical Decision Making Stable/Uncomplicated    Clinical Decision Making Low    Rehab Potential Good    PT Frequency 2x / week    PT Duration 8 weeks    PT Treatment/Interventions ADLs/Self Care Home Management;Aquatic Therapy;Cryotherapy;Electrical Stimulation;Iontophoresis 4mg /ml Dexamethasone;Moist Heat;Traction;Balance training;Therapeutic exercise;Therapeutic activities;Functional mobility training;Stair  training;Gait training;DME Instruction;Neuromuscular re-education;Patient/family education;Manual techniques;Dry needling;Passive range of motion;Joint Manipulations    PT Next Visit Plan gravity eliminated lumbar/hip mobility, TRA activation, LE strengthening    PT Home Exercise Plan 6/21 SKCs, lumbar rotation; 6/28 floor transfers, TRA activtation           Patient will benefit from skilled therapeutic intervention in order to improve the following deficits and impairments:  Pain, Decreased range of motion, Difficulty walking, Decreased activity tolerance, Decreased strength, Decreased endurance, Improper body mechanics, Postural dysfunction  Visit Diagnosis: Chronic left-sided low back pain with left-sided sciatica  Muscle weakness (generalized)     Problem List Patient Active Problem List   Diagnosis Date Noted  . HNP (herniated nucleus pulposus), lumbar 03/01/2019  . Encounter for IUD insertion 02/24/2018  . Shoulder dystocia during labor and delivery, delivered 02/14/2015  . CIN I (cervical intraepithelial neoplasia I) 04/25/2014  . GERD (gastroesophageal reflux disease) 11/24/2013  . 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 Regurgitation 11/24/2013   13/04/2013, LPTA/CLT; CBIS [phone]  [phone] 07/21/2019, 12:39 PM  [facility] [address] [place], [place], [place] Phone: [phone]   Fax:  [phone]  Name: Lisa Christian MRN: Haywood Lasso Date of Birth: 15-Apr-1990

## 2019-08-03 ENCOUNTER — Telehealth: Payer: Self-pay

## 2019-08-03 DIAGNOSIS — H4710 Unspecified papilledema: Secondary | ICD-10-CM

## 2019-08-03 NOTE — Telephone Encounter (Signed)
Patient has the new medicaid insurance healthy blue. I started a case online and uploaded the notes. It is pending.

## 2019-08-03 NOTE — Telephone Encounter (Signed)
Pt left a VM asking for a call to discuss scheduling her MRI and ophthalmology referral. She has not been contacted yet for ophthalmology referral and only once about the MRI and was never called back.

## 2019-08-04 ENCOUNTER — Ambulatory Visit (HOSPITAL_COMMUNITY): Payer: Medicaid Other

## 2019-08-04 NOTE — Telephone Encounter (Deleted)
Wylie Hail I need a referral for a neuro optomologiust

## 2019-08-04 NOTE — Addendum Note (Signed)
Addended by: [place] Pink on: 08/04/2019 03:54 PM   Modules accepted: Orders

## 2019-08-04 NOTE — Telephone Encounter (Signed)
Checked the healthy blue portal it is still pending.  

## 2019-08-04 NOTE — Telephone Encounter (Signed)
Neuro Ophthalmology referral placed for Dr Theone Murdoch, [facility].

## 2019-08-04 NOTE — Telephone Encounter (Signed)
Retina specialist is saying patient needs to be seen but Neuro Op . Patient is seeing Dr. Theone Murdoch this Friday July 16 th  Arrive at 7:00 am for 7:40 am  Patient has directions Telephone [phone] - Fax [phone] .  Wylie Hail please place order for Neuro OP .

## 2019-08-05 ENCOUNTER — Telehealth (HOSPITAL_COMMUNITY): Payer: Self-pay | Admitting: Physical Therapy

## 2019-08-05 ENCOUNTER — Encounter (HOSPITAL_COMMUNITY): Payer: Medicaid Other | Admitting: Physical Therapy

## 2019-08-05 NOTE — Telephone Encounter (Signed)
pt called to cancel roday's appt due to she has a stomach bug

## 2019-08-06 ENCOUNTER — Encounter (HOSPITAL_COMMUNITY): Payer: Medicaid Other

## 2019-08-10 ENCOUNTER — Telehealth: Payer: Self-pay | Admitting: *Deleted

## 2019-08-10 ENCOUNTER — Ambulatory Visit (HOSPITAL_COMMUNITY): Payer: Medicaid Other | Attending: Neurosurgery | Admitting: Physical Therapy

## 2019-08-10 ENCOUNTER — Encounter: Payer: Self-pay | Admitting: *Deleted

## 2019-08-10 DIAGNOSIS — G8929 Other chronic pain: Secondary | ICD-10-CM | POA: Insufficient documentation

## 2019-08-10 DIAGNOSIS — M6281 Muscle weakness (generalized): Secondary | ICD-10-CM | POA: Insufficient documentation

## 2019-08-10 DIAGNOSIS — M5442 Lumbago with sciatica, left side: Secondary | ICD-10-CM | POA: Insufficient documentation

## 2019-08-10 NOTE — Telephone Encounter (Signed)
Received office notes from [place], neuro-ophthalmology, Dr Darcella Cheshire. Note states patient needs MRI asap; tried to call patient to advise her to call [facility]. No answer and voice MB not set up. Sent my chart and will call again later.

## 2019-08-10 NOTE — Telephone Encounter (Signed)
Patient has scheduled her MRI brain with [facility] for 08/26/19. Dr Marjory Lies aware.

## 2019-08-11 ENCOUNTER — Ambulatory Visit (HOSPITAL_COMMUNITY): Payer: Medicaid Other | Admitting: Physical Therapy

## 2019-08-11 ENCOUNTER — Other Ambulatory Visit: Payer: Self-pay

## 2019-08-11 ENCOUNTER — Encounter (HOSPITAL_COMMUNITY): Payer: Self-pay | Admitting: Physical Therapy

## 2019-08-11 DIAGNOSIS — M6281 Muscle weakness (generalized): Secondary | ICD-10-CM | POA: Diagnosis present

## 2019-08-11 DIAGNOSIS — G8929 Other chronic pain: Secondary | ICD-10-CM | POA: Diagnosis present

## 2019-08-11 DIAGNOSIS — M5442 Lumbago with sciatica, left side: Secondary | ICD-10-CM | POA: Diagnosis not present

## 2019-08-11 NOTE — Patient Instructions (Signed)
Access Code: [email or web address] URL: [email or web address] Date: 08/11/2019 Prepared by: Georges Lynch  Exercises Supine Lower Trunk Rotation - 2 x daily - 7 x weekly - 1 sets - 10 reps - 5 hold Supine Transversus Abdominis Bracing - Hands on Stomach - 2 x daily - 7 x weekly - 2 sets - 10 reps - 5 hold Supine Bridge - 2 x daily - 7 x weekly - 2 sets - 10 reps - 5 hold Hooklying Single Knee to Chest Stretch - 2 x daily - 7 x weekly - 1 sets - 10 reps - 5 hold

## 2019-08-12 ENCOUNTER — Encounter (HOSPITAL_COMMUNITY): Payer: Medicaid Other | Admitting: Physical Therapy

## 2019-08-12 NOTE — Therapy (Signed)
[facility] [address] [place], [place], [address] Phone: [phone]   Fax:  [phone]  Physical Therapy Treatment  Patient Details  Name: Lisa Christian MRN: [id number] Date of Birth: 05-05-1990 Referring Provider (PT): Donalee Citrin MD   Encounter Date: 08/11/2019   PT End of Session - 08/11/19 1739    Visit Number 4    Number of Visits 16    Date for PT Re-Evaluation 09/06/19    Authorization Type medicaid - check auth    Authorization Time Period [employer] Submitted 7/13 pending    Authorization - Visit Number 3    Authorization - Number of Visits 3    Progress Note Due on Visit 10    PT Start Time 1740    PT Stop Time 1818    PT Time Calculation (min) 38 min    Activity Tolerance Patient tolerated treatment well   Fearful of movements/prone positoin   Behavior During Therapy [facility] for tasks assessed/performed           Past Medical History:  Diagnosis Date  . Anxiety   . Family history of adverse reaction to anesthesia    " not sure why second cousin can't get put to sleep."  . GERD (gastroesophageal reflux disease)   . Headache   . HNP (herniated nucleus pulposus), lumbar   . Panic attack   . Panic attacks   . Vitamin D deficiency     Past Surgical History:  Procedure Laterality Date  . BIOPSY N/A 01/11/2014   Procedure: BIOPSY;  Surgeon: West Bali, MD;  Location: [facility];  Service: Endoscopy;  Laterality: N/A;  duodenal and gastric  . ESOPHAGOGASTRODUODENOSCOPY (EGD) WITH PROPOFOL N/A 01/11/2014   BTD:[id number] non-erosive  . LUMBAR LAMINECTOMY/DECOMPRESSION MICRODISCECTOMY Left 03/01/2019   Procedure: Microdiscectomy - Lumbar Four-Lumbar Five - left;  Surgeon: Donalee Citrin, MD;  Location: [facility] OR;  Service: Neurosurgery;  Laterality: Left;  Microdiscectomy - Lumbar Four-Lumbar Five - left  . WISDOM TOOTH EXTRACTION      There were no vitals filed for this visit.   Subjective Assessment - 08/11/19 1743     Subjective Patient says she has been working on prior exercise. Says she went away on beach trip then got sick so was not able to return to therapy for a few weeks. Patient says she still has inflammation in her back, and has occasional flare ups. Says she feels better overall and that nerve pain is still present but not as bad as before surgery. Says she is still nervous to move certain ways.    Currently in Pain? Yes    Pain Score 3     Pain Location Back    Pain Orientation Lower    Pain Descriptors / Indicators Aching    Pain Type Chronic pain    Pain Onset More than a month ago             08/11/19 0001  Exercises  Exercises Lumbar  Lumbar Exercises: Stretches  Lower Trunk Rotation 5 reps;10 seconds  Lumbar Exercises: Supine  Ab Set 15 reps;5 seconds  Bent Knee Raise 20 reps  Bridge 15 reps;5 seconds       PT Education - 08/12/19 1819    Education Details on POC, HEP, current exercise and technique, red flags of pain and pacing of daily activity    Person(s) Educated Patient    Methods Explanation;Handout    Comprehension Verbalized understanding  PT Short Term Goals - 07/21/19 0936      PT SHORT TERM GOAL #1   Title Patient will be independent in self management strategies to improve quality of life and functional outcomes.    Baseline 07/21/19:  Reports compliance with HEP 1x daily.      PT SHORT TERM GOAL #2   Title Patient will report at least 25% improvement in overall symptoms and/or function to demonstrate improved functional mobility    Baseline 07/21/19:  20% improvements Reports improvements with knowledge of mechanics from floor to standing, as well as importance of core strengthening as well as s/s.             PT Long Term Goals - 07/21/19 0951      PT LONG TERM GOAL #1   Title Patient will be able to demonstrated at least 50% ROM in all directions of lumbar spine to demonstrate improved lumbar mobility    Status On-going       PT LONG TERM GOAL #2   Title Patient will report at least 50% improvement in overall symptoms and/or function to demonstrate improved functional mobility    Baseline 07/21/19:  20% improvements Reports improvements with knowledge of mechanics from floor to standing, as well as importance of core strengthening as well as s/s.                 Plan - 08/12/19 1815    Clinical Impression Statement Patient had several questions this session about POC and current HEP. Patient educated on exercise progressions, role of deep core muscles in lumbar stabilization and reviewed proper form and function of current HEP. Progressed core strengthening to include ab marching. Patient cued on proper form and function of added exercise, as well as appropriate range of motion with LTR and hip bridging. Patient issued updated HEP handout and educated on proper frequency and pacing of activity to avoid increased lumbar pain. Patient verbalized understanding.    Personal Factors and Comorbidities Comorbidity 1    Comorbidities chronic low back pain    Examination-Activity Limitations Bathing;Bed Mobility;Bend;Caring for Others;Lift;Locomotion Level;Stand;Squat;Sit    Examination-Participation Restrictions Cleaning;Community Activity;Driving;Meal Prep;Laundry;Yard Work;Shop    Stability/Clinical Decision Making Stable/Uncomplicated    Rehab Potential Good    PT Frequency 2x / week    PT Duration 8 weeks    PT Treatment/Interventions ADLs/Self Care Home Management;Aquatic Therapy;Cryotherapy;Electrical Stimulation;Iontophoresis 4mg /ml Dexamethasone;Moist Heat;Traction;Balance training;Therapeutic exercise;Therapeutic activities;Functional mobility training;Stair training;Gait training;DME Instruction;Neuromuscular re-education;Patient/family education;Manual techniques;Dry needling;Passive range of motion;Joint Manipulations    PT Next Visit Plan gravity eliminated lumbar/hip mobility, TRA activation, LE  strengthening    PT Home Exercise Plan 6/21 SKCs, lumbar rotation; 6/28 floor transfers, TRA activtation    Consulted and Agree with Plan of Care Patient           Patient will benefit from skilled therapeutic intervention in order to improve the following deficits and impairments:  Pain, Decreased range of motion, Difficulty walking, Decreased activity tolerance, Decreased strength, Decreased endurance, Improper body mechanics, Postural dysfunction  Visit Diagnosis: Chronic left-sided low back pain with left-sided sciatica  Muscle weakness (generalized)     Problem List Patient Active Problem List   Diagnosis Date Noted  . HNP (herniated nucleus pulposus), lumbar 03/01/2019  . Encounter for IUD insertion 02/24/2018  . Shoulder dystocia during labor and delivery, delivered 02/14/2015  . CIN I (cervical intraepithelial neoplasia I) 04/25/2014  . GERD (gastroesophageal reflux disease) 11/24/2013  . 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 Regurgitation 11/24/2013  6:23 PM, 08/12/19 Georges Lynch PT DPT  Physical Therapist with [facility]  [facility]  [phone]   [facility] [address] [place], [place], [address] Phone: [phone]   Fax:  [phone]  Name: Lisa Christian MRN: [id number] Date of Birth: 10-Jun-1990

## 2019-08-14 NOTE — Telephone Encounter (Signed)
[employer] healthy blue medicaid Lisa HarveyLevonne Lapping Lisa Christian #[id number] - [phone] exp 12/28/2019. Patient is scheduled at GI for 08/26/19.

## 2019-08-16 ENCOUNTER — Encounter (HOSPITAL_COMMUNITY): Payer: Self-pay

## 2019-08-16 ENCOUNTER — Other Ambulatory Visit: Payer: Self-pay

## 2019-08-16 ENCOUNTER — Ambulatory Visit (HOSPITAL_COMMUNITY): Payer: Medicaid Other | Admitting: Physical Therapy

## 2019-08-16 NOTE — Telephone Encounter (Signed)
[employer]y [place]: [id number] (exp. 08/03/19 to 10/04/19)

## 2019-08-16 NOTE — Telephone Encounter (Signed)
Healthy [employer] medicaid auth: [id number] (exp. 08/03/19 to 10/04/19) 

## 2019-08-18 ENCOUNTER — Other Ambulatory Visit: Payer: Self-pay

## 2019-08-18 ENCOUNTER — Encounter (HOSPITAL_COMMUNITY): Payer: Self-pay

## 2019-08-18 ENCOUNTER — Ambulatory Visit (HOSPITAL_COMMUNITY): Payer: Medicaid Other

## 2019-08-18 DIAGNOSIS — M6281 Muscle weakness (generalized): Secondary | ICD-10-CM

## 2019-08-18 DIAGNOSIS — M5442 Lumbago with sciatica, left side: Secondary | ICD-10-CM | POA: Diagnosis not present

## 2019-08-18 DIAGNOSIS — G8929 Other chronic pain: Secondary | ICD-10-CM

## 2019-08-18 NOTE — Therapy (Signed)
[facility] [address] [place], [place], [address] Phone: [phone]   Fax:  [phone]  Physical Therapy Treatment  Patient Details  Name: Lisa Christian MRN: [id number] Date of Birth: 05-06-1990 Referring Provider (PT): Donalee Citrin MD   Encounter Date: 08/18/2019   PT End of Session - 08/18/19 1014    Visit Number 5    Number of Visits 16    Date for PT Re-Evaluation 09/06/19    Authorization Type medicaid - check auth    Authorization Time Period [employer] Submitted 7/13 pending    Authorization - Visit Number 2    Authorization - Number of Visits 27    Progress Note Due on Visit 10    PT Start Time 1005    PT Stop Time 1045    PT Time Calculation (min) 40 min    Activity Tolerance Patient tolerated treatment well;No increased pain    Behavior During Therapy WFL for tasks assessed/performed           Past Medical History:  Diagnosis Date  . Anxiety   . Family history of adverse reaction to anesthesia    " not sure why second cousin can't get put to sleep."  . GERD (gastroesophageal reflux disease)   . Headache   . HNP (herniated nucleus pulposus), lumbar   . Panic attack   . Panic attacks   . Vitamin D deficiency     Past Surgical History:  Procedure Laterality Date  . BIOPSY N/A 01/11/2014   Procedure: BIOPSY;  Surgeon: West Bali, MD;  Location: [facility];  Service: Endoscopy;  Laterality: N/A;  duodenal and gastric  . ESOPHAGOGASTRODUODENOSCOPY (EGD) WITH PROPOFOL N/A 01/11/2014   WPY:[id number]/[id number] non-erosive  . LUMBAR LAMINECTOMY/DECOMPRESSION MICRODISCECTOMY Left 03/01/2019   Procedure: Microdiscectomy - Lumbar Four-Lumbar Five - left;  Surgeon: Donalee Citrin, MD;  Location: [facility] OR;  Service: Neurosurgery;  Laterality: Left;  Microdiscectomy - Lumbar Four-Lumbar Five - left  . WISDOM TOOTH EXTRACTION      There were no vitals filed for this visit.   Subjective Assessment - 08/18/19 1010    Subjective  Reports she had nerve pain following last session.  REports she has done nothing to cause the flare up following.  Reports she noticed her lower back pop, no reports of pain wiht the popping.    Currently in Pain? Yes    Pain Score 3     Pain Location Back    Pain Orientation Lower    Pain Descriptors / Indicators Aching;Tightness    Pain Type Chronic pain    Pain Radiating Towards down into leg into hips    Pain Onset More than a month ago    Pain Frequency Constant    Aggravating Factors  sitting, standing    Pain Relieving Factors laying down, meloxicam              [facility] PT Assessment - 08/18/19 0001      Assessment   Medical Diagnosis lumbar pain     Referring Provider (PT) Donalee Citrin MD    Onset Date/Surgical Date 03/01/19    Prior Therapy yes - 2 visits for low back prior to surgery       Precautions   Precautions None                         [facility] Adult PT Treatment/Exercise - 08/18/19 0001      Exercises  Exercises Lumbar      Lumbar Exercises: Stretches   Lower Trunk Rotation 5 reps;10 seconds    Other Lumbar Stretch Exercise prone position wiht pillow under hips      Lumbar Exercises: Supine   Ab Set 15 reps;5 seconds    Bent Knee Raise 10 reps;5 seconds    Bent Knee Raise Limitations paired with opposite UE    Bridge 10 reps;3 seconds    Isometric Hip Flexion 5 reps;5 seconds                  PT Education - 08/18/19 1055    Education Details Reviewed importance of core strengthening for lumbar stability, educated techniques to improve tolerance in prone    Person(s) Educated Patient    Methods Explanation    Comprehension Verbalized understanding;Returned demonstration;Verbal cues required;Tactile cues required;Need further instruction            PT Short Term Goals - 07/21/19 0936      PT SHORT TERM GOAL #1   Title Patient will be independent in self management strategies to improve quality of life and functional  outcomes.    Baseline 07/21/19:  Reports compliance with HEP 1x daily.      PT SHORT TERM GOAL #2   Title Patient will report at least 25% improvement in overall symptoms and/or function to demonstrate improved functional mobility    Baseline 07/21/19:  20% improvements Reports improvements with knowledge of mechanics from floor to standing, as well as importance of core strengthening as well as s/s.             PT Long Term Goals - 07/21/19 0951      PT LONG TERM GOAL #1   Title Patient will be able to demonstrated at least 50% ROM in all directions of lumbar spine to demonstrate improved lumbar mobility    Status On-going      PT LONG TERM GOAL #2   Title Patient will report at least 50% improvement in overall symptoms and/or function to demonstrate improved functional mobility    Baseline 07/21/19:  20% improvements Reports improvements with knowledge of mechanics from floor to standing, as well as importance of core strengthening as well as s/s.                 Plan - 08/18/19 1033    Clinical Impression Statement Reviewed importance of core strengthening and answered several questions concerning purpose of the exercises.  Pt educated on role of deep core muscles to assist wtih lumbar stabilition and verbal/tactile cueing to improve abdominal contraction. Pt continues to be fearful of prone position, educated use of pillow under hips to assist with lumbar alignment to reduce any increased lordacic curvature.  Pt able to tolerate prone position with no reports of increased pain or reports of nerve based symptoms.    Personal Factors and Comorbidities Comorbidity 1    Comorbidities chronic low back pain    Examination-Activity Limitations Bathing;Bed Mobility;Bend;Caring for Others;Lift;Locomotion Level;Stand;Squat;Sit    Examination-Participation Restrictions Cleaning;Community Activity;Driving;Meal Prep;Laundry;Yard Work;Shop    Stability/Clinical Decision Making  Stable/Uncomplicated    Clinical Decision Making Low    Rehab Potential Good    PT Frequency 2x / week    PT Duration 8 weeks    PT Treatment/Interventions ADLs/Self Care Home Management;Aquatic Therapy;Cryotherapy;Electrical Stimulation;Iontophoresis 4mg /ml Dexamethasone;Moist Heat;Traction;Balance training;Therapeutic exercise;Therapeutic activities;Functional mobility training;Stair training;Gait training;DME Instruction;Neuromuscular re-education;Patient/family education;Manual techniques;Dry needling;Passive range of motion;Joint Manipulations    PT Next Visit Plan gravity eliminated  lumbar/hip mobility, TRA activation, LE strengthening    PT Home Exercise Plan 6/21 SKCs, lumbar rotation; 6/28 floor transfers, TRA activtation           Patient will benefit from skilled therapeutic intervention in order to improve the following deficits and impairments:  Pain, Decreased range of motion, Difficulty walking, Decreased activity tolerance, Decreased strength, Decreased endurance, Improper body mechanics, Postural dysfunction  Visit Diagnosis: Chronic left-sided low back pain with left-sided sciatica  Muscle weakness (generalized)     Problem List Patient Active Problem List   Diagnosis Date Noted  . HNP (herniated nucleus pulposus), lumbar 03/01/2019  . Encounter for IUD insertion 02/24/2018  . Shoulder dystocia during labor and delivery, delivered 02/14/2015  . CIN I (cervical intraepithelial neoplasia I) 04/25/2014  . GERD (gastroesophageal reflux disease) 11/24/2013  . 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 Regurgitation 11/24/2013   Becky Sax, LPTA/CLT; CBIS [phone]  Juel Burrow 08/18/2019, 10:58 AM   [facility] [address] [place], [place], [address] Phone: [phone]   Fax:  [phone]  Name: Lisa Christian MRN: [id number] Date of Birth: December 05, 1990

## 2019-08-23 ENCOUNTER — Other Ambulatory Visit: Payer: Self-pay

## 2019-08-23 ENCOUNTER — Encounter (HOSPITAL_COMMUNITY): Payer: Self-pay | Admitting: Physical Therapy

## 2019-08-23 ENCOUNTER — Ambulatory Visit (HOSPITAL_COMMUNITY): Payer: Medicaid Other | Attending: Neurosurgery | Admitting: Physical Therapy

## 2019-08-23 DIAGNOSIS — G8929 Other chronic pain: Secondary | ICD-10-CM | POA: Insufficient documentation

## 2019-08-23 DIAGNOSIS — M5442 Lumbago with sciatica, left side: Secondary | ICD-10-CM | POA: Insufficient documentation

## 2019-08-23 DIAGNOSIS — M6281 Muscle weakness (generalized): Secondary | ICD-10-CM | POA: Insufficient documentation

## 2019-08-23 NOTE — Therapy (Signed)
[facility] [address] [place], [place], [address] Phone: [phone]   Fax:  [phone]  Physical Therapy Treatment  Patient Details  Name: Lisa Christian MRN: [id number] Date of Birth: 07-19-1990 Referring Provider (PT): Donalee Citrin MD   Encounter Date: 08/23/2019   PT End of Session - 08/23/19 1052    Visit Number 6    Number of Visits 16    Date for PT Re-Evaluation 09/06/19    Authorization Type medicaid - check auth    Authorization Time Period [employer] Submitted 7/13 pending    Authorization - Visit Number 3    Authorization - Number of Visits 27    Progress Note Due on Visit 10    PT Start Time 1052   pt late to session   PT Stop Time 1125    PT Time Calculation (min) 33 min    Activity Tolerance Patient tolerated treatment well;No increased pain    Behavior During Therapy WFL for tasks assessed/performed           Past Medical History:  Diagnosis Date  . Anxiety   . Family history of adverse reaction to anesthesia    " not sure why second cousin can't get put to sleep."  . GERD (gastroesophageal reflux disease)   . Headache   . HNP (herniated nucleus pulposus), lumbar   . Panic attack   . Panic attacks   . Vitamin D deficiency     Past Surgical History:  Procedure Laterality Date  . BIOPSY N/A 01/11/2014   Procedure: BIOPSY;  Surgeon: West Bali, MD;  Location: [facility];  Service: Endoscopy;  Laterality: N/A;  duodenal and gastric  . ESOPHAGOGASTRODUODENOSCOPY (EGD) WITH PROPOFOL N/A 01/11/2014   YIF:[id number]/OMVE non-erosive  . LUMBAR LAMINECTOMY/DECOMPRESSION MICRODISCECTOMY Left 03/01/2019   Procedure: Microdiscectomy - Lumbar Four-Lumbar Five - left;  Surgeon: Donalee Citrin, MD;  Location: [facility] OR;  Service: Neurosurgery;  Laterality: Left;  Microdiscectomy - Lumbar Four-Lumbar Five - left  . WISDOM TOOTH EXTRACTION      There were no vitals filed for this visit.   Subjective Assessment - 08/23/19 1104     Subjective States that she feels really tight in the back and she doesn't know if she slept wrong. States that she is really diligent about writing down her daily symptoms. States that she has not had any foot numbness about 1.5 months after surgery and then she started having that symptoms again after the last couple apts. States she doesn't know which exercise is causing her symptoms return. States that she is currently not having and foot numbness but she was having it in her left foot last night despite her back feeling pretty good. States that her right leg has been bothering her after surgery.    Currently in Pain? Yes    Pain Score 3     Pain Location Back    Pain Orientation Lower    Pain Descriptors / Indicators Aching;Tightness    Pain Type Chronic pain              [facility] PT Assessment - 08/23/19 0001      Assessment   Medical Diagnosis lumbar pain     Referring Provider (PT) Donalee Citrin MD    Onset Date/Surgical Date 03/01/19                         [facility] Adult PT Treatment/Exercise - 08/23/19 0001  Lumbar Exercises: Stretches   Single Knee to Chest Stretch 20 seconds;Right;Left   x10 B with towel    Lower Trunk Rotation Limitations 5 minutes       Lumbar Exercises: Supine   Other Supine Lumbar Exercises hip flexion holds - 3x5 5" holds                  PT Education - 08/23/19 1125    Education Details reviewed HEP and educated patient in rationale for exercises and discussed possibility of bridges being too challenging at the moment- instructed patient to discontinue bridges at home for time being    Person(s) Educated Patient    Methods Explanation    Comprehension Verbalized understanding            PT Short Term Goals - 07/21/19 0936      PT SHORT TERM GOAL #1   Title Patient will be independent in self management strategies to improve quality of life and functional outcomes.    Baseline 07/21/19:  Reports compliance with HEP  1x daily.      PT SHORT TERM GOAL #2   Title Patient will report at least 25% improvement in overall symptoms and/or function to demonstrate improved functional mobility    Baseline 07/21/19:  20% improvements Reports improvements with knowledge of mechanics from floor to standing, as well as importance of core strengthening as well as s/s.             PT Long Term Goals - 07/21/19 0951      PT LONG TERM GOAL #1   Title Patient will be able to demonstrated at least 50% ROM in all directions of lumbar spine to demonstrate improved lumbar mobility    Status On-going      PT LONG TERM GOAL #2   Title Patient will report at least 50% improvement in overall symptoms and/or function to demonstrate improved functional mobility    Baseline 07/21/19:  20% improvements Reports improvements with knowledge of mechanics from floor to standing, as well as importance of core strengthening as well as s/s.                 Plan - 08/23/19 1111    Clinical Impression Statement Focused on assessment on HEP secondary to patient's concerns about numbness in feet. Discussed possible outlier exercise as cause of symptoms and discussed putting bridge on hold for time being until core is strong enough for exercise. Will continue to work on hip and core strengthening as tolerated by pt. Will add bridge back as tolerated by patient.    Personal Factors and Comorbidities Comorbidity 1    Comorbidities chronic low back pain    Examination-Activity Limitations Bathing;Bed Mobility;Bend;Caring for Others;Lift;Locomotion Level;Stand;Squat;Sit    Examination-Participation Restrictions Cleaning;Community Activity;Driving;Meal Prep;Laundry;Yard Work;Shop    Stability/Clinical Decision Making Stable/Uncomplicated    Rehab Potential Good    PT Frequency 2x / week    PT Duration 8 weeks    PT Treatment/Interventions ADLs/Self Care Home Management;Aquatic Therapy;Cryotherapy;Electrical Stimulation;Iontophoresis  4mg /ml Dexamethasone;Moist Heat;Traction;Balance training;Therapeutic exercise;Therapeutic activities;Functional mobility training;Stair training;Gait training;DME Instruction;Neuromuscular re-education;Patient/family education;Manual techniques;Dry needling;Passive range of motion;Joint Manipulations    PT Next Visit Plan gravity eliminated lumbar/hip mobility, TRA activation, LE strengthening, HOLD on bridges for now, continue LE/core strength as tolerated and lumbar mobility.    PT Home Exercise Plan 6/21 SKCs, lumbar rotation; 6/28 floor transfers, TRA activtation    Consulted and Agree with Plan of Care Patient  Patient will benefit from skilled therapeutic intervention in order to improve the following deficits and impairments:  Pain, Decreased range of motion, Difficulty walking, Decreased activity tolerance, Decreased strength, Decreased endurance, Improper body mechanics, Postural dysfunction  Visit Diagnosis: Chronic left-sided low back pain with left-sided sciatica  Muscle weakness (generalized)     Problem List Patient Active Problem List   Diagnosis Date Noted  . HNP (herniated nucleus pulposus), lumbar 03/01/2019  . Encounter for IUD insertion 02/24/2018  . Shoulder dystocia during labor and delivery, delivered 02/14/2015  . CIN I (cervical intraepithelial neoplasia I) 04/25/2014  . GERD (gastroesophageal reflux disease) 11/24/2013  . 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 Regurgitation 11/24/2013   11:31 AM, 08/23/19 Tereasa Coop, DPT Physical Therapy with [facility]  [phone] office  [facility] [address] [place], [place], [address] Phone: [phone]   Fax:  [phone]  Name: Lisa Christian MRN: [id number] Date of Birth: 10-19-1990

## 2019-08-25 ENCOUNTER — Ambulatory Visit (HOSPITAL_COMMUNITY): Payer: Medicaid Other

## 2019-08-25 ENCOUNTER — Encounter (HOSPITAL_COMMUNITY): Payer: Self-pay

## 2019-08-25 ENCOUNTER — Other Ambulatory Visit: Payer: Self-pay

## 2019-08-25 DIAGNOSIS — M5442 Lumbago with sciatica, left side: Secondary | ICD-10-CM | POA: Diagnosis not present

## 2019-08-25 DIAGNOSIS — M6281 Muscle weakness (generalized): Secondary | ICD-10-CM

## 2019-08-25 NOTE — Therapy (Signed)
[facility] [address] [place], [place], [address] Phone: [phone]   Fax:  [phone]  Physical Therapy Treatment  Patient Details  Name: Lisa Christian MRN: [id number] Date of Birth: 15-Jan-1991 Referring Provider (PT): Donalee Citrin MD   Encounter Date: 08/25/2019   PT End of Session - 08/25/19 1121    Visit Number 7    Number of Visits 16    Date for PT Re-Evaluation 09/06/19    Authorization Type [employer] blue approved 12 units    Authorization Time Period 7/14--09/06/19    Authorization - Visit Number 3    Authorization - Number of Visits 12    Progress Note Due on Visit 10    PT Start Time 1055   late for apt   PT Stop Time 1130    PT Time Calculation (min) 35 min    Activity Tolerance Patient tolerated treatment well;No increased pain    Behavior During Therapy WFL for tasks assessed/performed           Past Medical History:  Diagnosis Date  . Anxiety   . Family history of adverse reaction to anesthesia    " not sure why second cousin can't get put to sleep."  . GERD (gastroesophageal reflux disease)   . Headache   . HNP (herniated nucleus pulposus), lumbar   . Panic attack   . Panic attacks   . Vitamin D deficiency     Past Surgical History:  Procedure Laterality Date  . BIOPSY N/A 01/11/2014   Procedure: BIOPSY;  Surgeon: West Bali, MD;  Location: [facility];  Service: Endoscopy;  Laterality: N/A;  duodenal and gastric  . ESOPHAGOGASTRODUODENOSCOPY (EGD) WITH PROPOFOL N/A 01/11/2014   QBV:[id number]/KCMK non-erosive  . LUMBAR LAMINECTOMY/DECOMPRESSION MICRODISCECTOMY Left 03/01/2019   Procedure: Microdiscectomy - Lumbar Four-Lumbar Five - left;  Surgeon: Donalee Citrin, MD;  Location: [facility] OR;  Service: Neurosurgery;  Laterality: Left;  Microdiscectomy - Lumbar Four-Lumbar Five - left  . WISDOM TOOTH EXTRACTION      There were no vitals filed for this visit.   Subjective Assessment - 08/25/19 1056    Subjective Pt  reports reduction in nerve pain following holding the bridge exercise.  Reports back is stiff today, pain scale 5/10.    Currently in Pain? Yes    Pain Location Back    Pain Orientation Lower    Pain Descriptors / Indicators Tightness    Pain Type Chronic pain                             [facility] Adult PT Treatment/Exercise - 08/25/19 0001      Exercises   Exercises Lumbar      Lumbar Exercises: Standing   Forward Lunge 10 reps    Forward Lunge Limitations 6in step core engagement prior movement    Other Standing Lumbar Exercises 452 no AD    Other Standing Lumbar Exercises NBOS on foam green weighted ball forward then above 20x each; marching 20x each on foam; vector stance 3x 5"      Lumbar Exercises: Seated   Other Seated Lumbar Exercises TrA activation tactile cueing and visual feedback to improve posture with                    PT Short Term Goals - 07/21/19 0936      PT SHORT TERM GOAL #1   Title Patient will be  independent in self management strategies to improve quality of life and functional outcomes.    Baseline 07/21/19:  Reports compliance with HEP 1x daily.      PT SHORT TERM GOAL #2   Title Patient will report at least 25% improvement in overall symptoms and/or function to demonstrate improved functional mobility    Baseline 07/21/19:  20% improvements Reports improvements with knowledge of mechanics from floor to standing, as well as importance of core strengthening as well as s/s.             PT Long Term Goals - 07/21/19 0951      PT LONG TERM GOAL #1   Title Patient will be able to demonstrated at least 50% ROM in all directions of lumbar spine to demonstrate improved lumbar mobility    Status On-going      PT LONG TERM GOAL #2   Title Patient will report at least 50% improvement in overall symptoms and/or function to demonstrate improved functional mobility    Baseline 07/21/19:  20% improvements Reports improvements  with knowledge of mechanics from floor to standing, as well as importance of core strengthening as well as s/s.                 Plan - 08/25/19 1301    Clinical Impression Statement Progressed core and proximal strengthening to standing activities.  Pt tolerated well wtih no reports of nerve based pain.  Majority of exericses based upon flexed based standing activities for core engagement.  Verbal and tactile cuieng required for TrA activation in seated position.    Personal Factors and Comorbidities Comorbidity 1    Comorbidities chronic low back pain    Examination-Activity Limitations Bathing;Bed Mobility;Bend;Caring for Others;Lift;Locomotion Level;Stand;Squat;Sit    Examination-Participation Restrictions Cleaning;Community Activity;Driving;Meal Prep;Laundry;Yard Work;Shop    Stability/Clinical Decision Making Stable/Uncomplicated    Clinical Decision Making Low    Rehab Potential Good    PT Frequency 2x / week    PT Duration 8 weeks    PT Treatment/Interventions ADLs/Self Care Home Management;Aquatic Therapy;Cryotherapy;Electrical Stimulation;Iontophoresis 4mg /ml Dexamethasone;Moist Heat;Traction;Balance training;Therapeutic exercise;Therapeutic activities;Functional mobility training;Stair training;Gait training;DME Instruction;Neuromuscular re-education;Patient/family education;Manual techniques;Dry needling;Passive range of motion;Joint Manipulations    PT Next Visit Plan Progress lumbar stability and hip mobility, TRA activation and LE strengthening.  Hold on bridges for now.    PT Home Exercise Plan 6/21 SKCs, lumbar rotation; 6/28 floor transfers, TRA activtation           Patient will benefit from skilled therapeutic intervention in order to improve the following deficits and impairments:  Pain, Decreased range of motion, Difficulty walking, Decreased activity tolerance, Decreased strength, Decreased endurance, Improper body mechanics, Postural dysfunction  Visit  Diagnosis: Chronic left-sided low back pain with left-sided sciatica  Muscle weakness (generalized)     Problem List Patient Active Problem List   Diagnosis Date Noted  . HNP (herniated nucleus pulposus), lumbar 03/01/2019  . Encounter for IUD insertion 02/24/2018  . Shoulder dystocia during labor and delivery, delivered 02/14/2015  . CIN I (cervical intraepithelial neoplasia I) 04/25/2014  . GERD (gastroesophageal reflux disease) 11/24/2013  . 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 Regurgitation 11/24/2013   13/04/2013, LPTA/CLT; CBIS [phone]  [phone] 08/25/2019, 1:06 PM  [facility] [address] [place], [place], [place] Phone: [phone]   Fax:  [phone]  Name: Lisa Christian MRN: Haywood Lasso Date of Birth: 1990/08/13

## 2019-08-26 ENCOUNTER — Ambulatory Visit
Admission: RE | Admit: 2019-08-26 | Discharge: 2019-08-26 | Disposition: A | Discharge status: Home / Self Care | Payer: Medicaid Other | Source: Ambulatory Visit | Attending: Diagnostic Neuroimaging | Admitting: Diagnostic Neuroimaging

## 2019-08-26 DIAGNOSIS — H4710 Unspecified papilledema: Secondary | ICD-10-CM | POA: Diagnosis not present

## 2019-08-26 MED ORDER — GADOBENATE DIMEGLUMINE 529 MG/ML IV SOLN
18.0000 mL | Freq: Once | INTRAVENOUS | Status: AC | PRN
  Administered 2019-08-26: 18 mL via INTRAVENOUS

## 2019-08-27 ENCOUNTER — Other Ambulatory Visit: Payer: Self-pay | Admitting: Diagnostic Neuroimaging

## 2019-08-27 DIAGNOSIS — G932 Benign intracranial hypertension: Secondary | ICD-10-CM

## 2019-08-30 ENCOUNTER — Telehealth: Payer: Self-pay | Admitting: *Deleted

## 2019-08-30 NOTE — Telephone Encounter (Signed)
Called patient and advised her MRI brain results are unremarkable imaging results. Dr Marjory Lies will  proceed with LP to check opening pressure.  She stated that her headaches are not bad, and he had told her that if headaches weren;t bad she may not need LP. I discussed his notes with her, and she insisted to see him before scheduled LP on 09/14/19.  We scheduled for 8/18. Patient verbalized understanding, appreciation.

## 2019-08-31 ENCOUNTER — Ambulatory Visit (HOSPITAL_COMMUNITY): Payer: Medicaid Other | Admitting: Physical Therapy

## 2019-09-02 ENCOUNTER — Encounter (HOSPITAL_COMMUNITY): Payer: Medicaid Other

## 2019-09-02 ENCOUNTER — Ambulatory Visit (HOSPITAL_COMMUNITY): Payer: Medicaid Other | Admitting: Physical Therapy

## 2019-09-02 ENCOUNTER — Other Ambulatory Visit: Payer: Self-pay

## 2019-09-02 DIAGNOSIS — M5442 Lumbago with sciatica, left side: Secondary | ICD-10-CM | POA: Diagnosis not present

## 2019-09-02 DIAGNOSIS — G8929 Other chronic pain: Secondary | ICD-10-CM

## 2019-09-02 DIAGNOSIS — M6281 Muscle weakness (generalized): Secondary | ICD-10-CM

## 2019-09-02 NOTE — Therapy (Addendum)
[facility] [address] [place], [place], [address] Phone: [phone]   Fax:  [phone]  Physical Therapy Treatment and Discharge Note  Patient Details  Name: Lisa Christian MRN: [id number] Date of Birth: 06-Jun-1990 Referring Provider (PT): Kary Kos MD   PHYSICAL THERAPY DISCHARGE SUMMARY  Visits from Start of Care: 8  Current functional level related to goals / functional outcomes: See below   Remaining deficits: Continued pain   Education / Equipment: See below  Plan: Patient agrees to discharge.  Patient goals were not met. Patient is being discharged due to the patient's request.  ?????    09/22/19 - Spoke with patient on phone and pain continues to increase/is worse compared to when she has started PT. Would like to follow up with MD and wait until she is cleared from MD prior to starting PT back up.   11:37 AM, 09/22/19 [name], DPT Physical Therapy with [facility]  [phone] office    Encounter Date: 09/02/2019   PT End of Session - 09/02/19 1514    Visit Number 8    Number of Visits 16    Date for PT Re-Evaluation 09/06/19    Authorization Type medicaid - check auth    Authorization Time Period [employer] Submitted 7/13 pending    Authorization - Visit Number 3    Authorization - Number of Visits 27    Progress Note Due on Visit 10    PT Start Time 1450    PT Stop Time 1530    PT Time Calculation (min) 40 min    Activity Tolerance Patient tolerated treatment well;No increased pain    Behavior During Therapy WFL for tasks assessed/performed           Past Medical History:  Diagnosis Date  . Anxiety   . Family history of adverse reaction to anesthesia    " not sure why second cousin can't get put to sleep."  . GERD (gastroesophageal reflux disease)   . Headache   . HNP (herniated nucleus pulposus), lumbar   . Panic attack   . Panic attacks   . Vitamin D deficiency      Past Surgical History:  Procedure Laterality Date  . BIOPSY N/A 01/11/2014   Procedure: BIOPSY;  Surgeon: Danie Binder, MD;  Location: [facility];  Service: Endoscopy;  Laterality: N/A;  duodenal and gastric  . ESOPHAGOGASTRODUODENOSCOPY (EGD) WITH PROPOFOL N/A 01/11/2014   JGG:EZMOQHUTM/LYYT non-erosive  . LUMBAR LAMINECTOMY/DECOMPRESSION MICRODISCECTOMY Left 03/01/2019   Procedure: Microdiscectomy - Lumbar Four-Lumbar Five - left;  Surgeon: Kary Kos, MD;  Location: [facility];  Service: Neurosurgery;  Laterality: Left;  Microdiscectomy - Lumbar Four-Lumbar Five - left  . WISDOM TOOTH EXTRACTION      There were no vitals filed for this visit.   Subjective Assessment - 09/02/19 1454    Subjective PT states that she feels that physical therapy is making her worse.  It is a hit or miss.  She continues to have nerve pain.    How long can you sit comfortably? able to sit for five minutes when she has pain going down her right leg    How long can you stand comfortably? Able to stand for 30 minutes    How long can you walk comfortably? able to walk 10 minutes.    Pain Score --   different every time; pt states back 3; radiating 10   Pain Location Back  Pain Orientation Lower    Pain Type Chronic pain    Pain Radiating Towards Down to Rt foot    Pain Onset More than a month ago    Pain Frequency Constant    Aggravating Factors  sitting ,    Pain Relieving Factors laying down meloxicam              [facility] PT Assessment - 09/02/19 0001      Assessment   Medical Diagnosis lumbar pain     Referring Provider (PT) Kary Kos MD    Onset Date/Surgical Date 03/01/19    Prior Therapy yes - 2 visits for low back prior to surgery       Precautions   Precautions None      Restrictions   Weight Bearing Restrictions No      [place] residence    Available Help at Discharge Family;Friend(s)    Type of [place] to enter     Entrance Stairs-Number of Steps [place] One level    Additional Comments no difficulty with steps       Cognition   Overall Cognitive Status Within Functional Limits for tasks assessed      Observation/Other Assessments   Other Surveys  Oswestry Disability Index    Oswestry Disability Index  26/50 --> 52% disability      Posture/Postural Control   Posture Comments sacral sititng      AROM   Right Hip Flexion 110   hesistant to move and painful    Right Hip External Rotation  45    Right Hip Internal Rotation  30    Left Hip Flexion 110   hesistant to move and painful    Left Hip External Rotation  45    Left Hip Internal Rotation  30    Lumbar Flexion 75% limited    hesistant to move and painful was 75%    Lumbar Extension 90% limited   hesistant to move and painful  was 100% limited    Lumbar - Right Side Bend 50% limited   hesistant to move and painful  continues to be 50% limited    Lumbar - Left Side Bend 75% limited   hesistant to move and painful  was 75% limited    Lumbar - Right Rotation 100% limited   hesistant to move and painful    Lumbar - Left Rotation 100% limited   hesistant to move and painful      Strength   Right Hip Flexion 4/5    Left Hip Flexion 4+/5    Right Knee Flexion 3+/5    Right Knee Extension 4-/5   was 4+   Left Knee Flexion 3+/5    Left Knee Extension 4-/5   was 4+   Right Ankle Dorsiflexion 3+/5   was 4+    Left Ankle Dorsiflexion 3+/5   was 4+                        [facility] Adult PT Treatment/Exercise - 09/02/19 0001      Exercises   Exercises Lumbar                    PT Short Term Goals - 09/02/19 1516      PT SHORT TERM GOAL #1   Title Patient will be independent in self management strategies to  improve quality of life and functional outcomes.    Baseline 07/21/19:  Reports compliance with HEP 1x daily.    Status Not Met      PT SHORT TERM GOAL #2   Title Patient will report at least 25%  improvement in overall symptoms and/or function to demonstrate improved functional mobility    Baseline 07/21/19:  20% improvements Reports improvements with knowledge of mechanics from floor to standing, as well as importance of core strengthening as well as s/s.    Status Not Met             PT Long Term Goals - 09/02/19 1517      PT LONG TERM GOAL #1   Title Patient will be able to demonstrated at least 50% ROM in all directions of lumbar spine to demonstrate improved lumbar mobility    Status Not Met      PT LONG TERM GOAL #2   Title Patient will report at least 50% improvement in overall symptoms and/or function to demonstrate improved functional mobility    Baseline 07/21/19:  20% improvements Reports improvements with knowledge of mechanics from floor to standing, as well as importance of core strengthening as well as s/s.    Status Not Met      PT LONG TERM GOAL #3   Title Patient will score with < 43% on Oswesry Low back index to demonstrate clinically improved change in overall back disability.    Status Not Met                 Plan - 09/02/19 1524    Clinical Impression Statement PT reassessed she has very limited motion and is very fearful of moving.  She is concerned that she is now having the nerve pain that was going down her left leg going down her right.  She is only walking ten minutes at a time and is not trying to progress.  Most mm strengths have actually decrease instead of inccreased.  The patient would like to continue but have more consistancy with therapists as she feels that she has a complicated diagnosis.  Ms Alva Garnet is 29 yo s/p back surgery in Febuary who should be moving and walking significantly bettter than she is; she is obviously nervous about her condition and would progress better if she is allow to stay with one therapist.  She will benefit from skilled PT to focus on both pain-free mobility and stability of the spine.    Personal Factors and  Comorbidities Comorbidity 1    Comorbidities chronic low back pain    Examination-Activity Limitations Bathing;Bed Mobility;Bend;Caring for Others;Lift;Locomotion Level;Stand;Squat;Sit    Examination-Participation Restrictions Cleaning;Community Activity;Driving;Meal Prep;Laundry;Yard Work;Shop    Stability/Clinical Decision Making Stable/Uncomplicated    Rehab Potential Good    PT Frequency 2x / week    PT Duration 8 weeks    PT Treatment/Interventions ADLs/Self Care Home Management;Aquatic Therapy;Cryotherapy;Electrical Stimulation;Iontophoresis 4mg /ml Dexamethasone;Moist Heat;Traction;Balance training;Therapeutic exercise;Therapeutic activities;Functional mobility training;Stair training;Gait training;DME Instruction;Neuromuscular re-education;Patient/family education;Manual techniques;Dry needling;Passive range of motion;Joint Manipulations    PT Next Visit Plan Progress lumbar stability and hip mobility, TRA activation and LE strengthening.    PT Home Exercise Plan 6/21 SKCs, lumbar rotation; 6/28 floor transfers, TRA activtation           Patient will benefit from skilled therapeutic intervention in order to improve the following deficits and impairments:  Pain, Decreased range of motion, Difficulty walking, Decreased activity tolerance, Decreased strength, Decreased endurance, Improper body mechanics, Postural dysfunction  Visit Diagnosis: Chronic left-sided low back pain with left-sided sciatica  Muscle weakness (generalized)     Problem List Patient Active Problem List   Diagnosis Date Noted  . HNP (herniated nucleus pulposus), lumbar 03/01/2019  . Encounter for IUD insertion 02/24/2018  . Shoulder dystocia during labor and delivery, delivered 02/14/2015  . CIN I (cervical intraepithelial neoplasia I) 04/25/2014  . GERD (gastroesophageal reflux disease) 11/24/2013  . 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 Regurgitation 11/24/2013  Rayetta Humphrey, PT  CLT [id number] 09/02/2019, 3:54 PM  [facility] [address] [place], [place], [address] Phone: [phone]   Fax:  [phone]  Name: Lisa Christian MRN: [id number] Date of Birth: 1990-12-12

## 2019-09-07 ENCOUNTER — Encounter (HOSPITAL_COMMUNITY): Payer: Medicaid Other

## 2019-09-08 ENCOUNTER — Encounter: Payer: Self-pay | Admitting: Diagnostic Neuroimaging

## 2019-09-08 ENCOUNTER — Ambulatory Visit: Payer: Medicaid Other | Admitting: Diagnostic Neuroimaging

## 2019-09-08 VITALS — BP 103/69 | HR 89 | Ht 66.0 in | Wt 200.0 lb

## 2019-09-08 DIAGNOSIS — G932 Benign intracranial hypertension: Secondary | ICD-10-CM

## 2019-09-08 DIAGNOSIS — H4710 Unspecified papilledema: Secondary | ICD-10-CM | POA: Diagnosis not present

## 2019-09-08 NOTE — Progress Notes (Signed)
[facility]  PATIENT: Lisa Christian DOB: 08-04-1990  REFERRING CLINICIAN: [facility] [place] [facility] HISTORY FROM: patient  REASON FOR VISIT: follow up   HISTORICAL  CHIEF COMPLAINT:  Chief Complaint  Patient presents with  . Optic disk edema    rm 7  FU to discuss MRI results before LP on 8/24    HISTORY OF PRESENT ILLNESS:   UPDATE (09/08/19, VRP): Since last visit, saw Dr. Darcella [facility]) and confirmed mild papilledema in right eye.  MRI results reviewed. Planning for LP soon. Patient is reluctant to get LP.   PRIOR HPI (06/30/19, VRP): 29 year old female here for evaluation of papilledema. Patient had routine eye exam was diagnosed with bilateral optic disc edema. She has been having headaches associated with nausea, photophobia, squeezing sensation, throbbing sensation. She has had some whooshing sound in her right ear. No transient visual obscuration. Patient has had some weight gain over the last 5 years from 150 pounds up to 220 pounds. She also had IUD placed last year.    REVIEW OF SYSTEMS: Full 14 system review of systems performed and negative with exception of: As per HPI.  ALLERGIES: Allergies  Allergen Reactions  . Latex     Vaginal irritation    HOME MEDICATIONS: Outpatient Medications Prior to Visit  Medication Sig Dispense Refill  . acetaminophen (TYLENOL) 500 MG tablet Take 2 tablets (1,000 mg total) by mouth every 6 (six) hours as needed for mild pain or moderate pain. 21 tablet 0  . levonorgestrel (LILETTA, 52 MG,) 19.5 MCG/DAY IUD IUD 1 each by Intrauterine route once.    . meloxicam (MOBIC) 7.5 MG tablet Take 7.5 mg by mouth as needed for pain.    . methocarbamol (ROBAXIN) 500 MG tablet Take 1 tablet (500 mg total) by mouth 2 (two) times daily. 20 tablet 0  . HYDROcodone-acetaminophen (NORCO/VICODIN) 5-325 MG tablet Take 1 tablet by mouth every 4 (four) hours as needed for moderate pain. (Patient not taking: Reported on 06/30/2019) 15  tablet 0  . oxyCODONE-acetaminophen (PERCOCET/ROXICET) 5-325 MG tablet Take 1 tablet by mouth every 4 (four) hours as needed for severe pain. (Patient not taking: Reported on 06/30/2019) 15 tablet 0  . Vitamin D, Ergocalciferol, (DRISDOL) 1.25 MG (50000 UNIT) CAPS capsule Take 50,000 Units by mouth once a week. (Patient not taking: Reported on 09/08/2019)     No facility-administered medications prior to visit.    PAST MEDICAL HISTORY: Past Medical History:  Diagnosis Date  . Anxiety   . Family history of adverse reaction to anesthesia    " not sure why second cousin can't get put to sleep."  . GERD (gastroesophageal reflux disease)   . Headache   . HNP (herniated nucleus pulposus), lumbar   . Panic attack   . Panic attacks   . Vitamin D deficiency     PAST SURGICAL HISTORY: Past Surgical History:  Procedure Laterality Date  . BIOPSY N/A 01/11/2014   Procedure: BIOPSY;  Surgeon: West Bali, MD;  Location: [facility];  Service: Endoscopy;  Laterality: N/A;  duodenal and gastric  . ESOPHAGOGASTRODUODENOSCOPY (EGD) WITH PROPOFOL N/A 01/11/2014   JOA:CZYSAYTKZ/SWFU non-erosive  . LUMBAR LAMINECTOMY/DECOMPRESSION MICRODISCECTOMY Left 03/01/2019   Procedure: Microdiscectomy - Lumbar Four-Lumbar Five - left;  Surgeon: Donalee Citrin, MD;  Location: [facility];  Service: Neurosurgery;  Laterality: Left;  Microdiscectomy - Lumbar Four-Lumbar Five - left  . WISDOM TOOTH EXTRACTION      FAMILY HISTORY: Family History  Problem Relation Age of  Onset  . Anxiety disorder Mother   . Heart disease Maternal Grandfather   . Cancer Maternal Grandmother        breast  . Colon cancer Other     SOCIAL HISTORY: Social History   Socioeconomic History  . Marital status: Single    Spouse name: Not on file  . Number of children: 1  . Years of education: Not on file  . Highest education level: Associate degree: occupational, Scientist, product/process development, or vocational program  Occupational History  . Occupation:  cosmetologist    Comment: at home  Tobacco Use  . Smoking status: Never Smoker  . Smokeless tobacco: Never Used  Vaping Use  . Vaping Use: Never used  Substance and Sexual Activity  . Alcohol use: Not Currently  . Drug use: No  . Sexual activity: Yes    Birth control/protection: I.U.D.  Other Topics Concern  . Not on file  Social History Narrative   Lives with child   Social Determinants of Health   Financial Resource Strain:   . Difficulty of Paying Living Expenses:   Food Insecurity:   . Worried About Programme researcher, broadcasting/film/video in the Last Year:   . Barista in the Last Year:   Transportation Needs:   . Freight forwarder (Medical):   Marland Kitchen Lack of Transportation (Non-Medical):   Physical Activity:   . Days of Exercise per Week:   . Minutes of Exercise per Session:   Stress:   . Feeling of Stress :   Social Connections:   . Frequency of Communication with Friends and Family:   . Frequency of Social Gatherings with Friends and Family:   . Attends Religious Services:   . Active Member of Clubs or Organizations:   . Attends Banker Meetings:   Marland Kitchen Marital Status:   Intimate Partner Violence:   . Fear of Current or Ex-Partner:   . Emotionally Abused:   Marland Kitchen Physically Abused:   . Sexually Abused:      PHYSICAL EXAM  GENERAL EXAM/CONSTITUTIONAL: Vitals:  Vitals:   09/08/19 1524  BP: 103/69  Pulse: 89  Weight: 200 lb (90.7 kg)  Height: 5\' 6"  (1.676 m)   Body mass index is 32.28 kg/m. Wt Readings from Last 3 Encounters:  09/08/19 200 lb (90.7 kg)  06/30/19 195 lb 9.6 oz (88.7 kg)  03/01/19 190 lb (86.2 kg)    Patient is in no distress; well developed, nourished and groomed; neck is supple  CARDIOVASCULAR:  Examination of carotid arteries is normal; no carotid bruits  Regular rate and rhythm, no murmurs  Examination of peripheral vascular system by observation and palpation is normal  EYES:  Ophthalmoscopic exam of optic discs and  posterior segments is notable for mild papilledema in right eye No exam data present  MUSCULOSKELETAL:  Gait, strength, tone, movements noted in Neurologic exam below  NEUROLOGIC: MENTAL STATUS:  No flowsheet data found.  awake, alert, oriented to person, place and time  recent and remote memory intact  normal attenmild papilledema in right eyetion and concentration  language fluent, comprehension intact, naming intact  fund of knowledge appropriate  CRANIAL NERVE:   2nd - mild papilledema in right eye  2nd, 3rd, 4th, 6th - pupils equal and reactive to light, visual fields full to confrontation, extraocular muscles intact, no nystagmus  5th - facial sensation symmetric  7th - facial strength symmetric  8th - hearing intact  9th - palate elevates symmetrically, uvula midline  11th -  shoulder shrug symmetric  12th - tongue protrusion midline  MOTOR:   normal bulk and tone, full strength in the BUE, BLE  SENSORY:   normal and symmetric to light touch, temperature, vibration  COORDINATION:   finger-nose-finger, fine finger movements normal  REFLEXES:   deep tendon reflexes present and symmetric  GAIT/STATION:   narrow based gait     DIAGNOSTIC DATA (LABS, IMAGING, TESTING) - I reviewed patient records, labs, notes, testing and imaging myself where available.  Lab Results  Component Value Date   WBC 12.3 (H) 03/01/2019   HGB 13.2 03/01/2019   HCT 39.2 03/01/2019   MCV 95.8 03/01/2019   PLT 328 03/01/2019      Component Value Date/Time   NA 139 03/01/2019 0637   NA 138 02/02/2018 1531   K 3.8 03/01/2019 0637   CL 103 03/01/2019 0637   CO2 25 03/01/2019 0637   GLUCOSE 99 03/01/2019 0637   BUN 9 03/01/2019 0637   BUN 6 02/02/2018 1531   CREATININE 0.56 03/01/2019 0637   CALCIUM 9.3 03/01/2019 0637   PROT 7.0 02/02/2018 1531   ALBUMIN 4.4 02/02/2018 1531   AST 18 02/02/2018 1531   ALT 17 02/02/2018 1531   ALKPHOS 63 02/02/2018 1531    BILITOT 0.4 02/02/2018 1531   GFRNONAA >60 03/01/2019 0637   GFRAA >60 03/01/2019 0637   No results found for: CHOL, HDL, LDLCALC, LDLDIRECT, TRIG, CHOLHDL No results found for: RCBU3A No results found for: VITAMINB12 Lab Results  Component Value Date   TSH 3.100 02/02/2018    08/26/19 MRI brain (with and without) demonstrating; -Enlarged optic nerve sheaths with tortuous courses.  These are nonspecific findings but can be seen in setting of idiopathic intracranial hypertension. -Otherwise normal brain parenchyma.    ASSESSMENT AND PLAN  29 y.o. 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 Also with headaches with migraine features. We will proceed with further work-up and evaluation.  Dx:  1. IIH (idiopathic intracranial hypertension)   2. Optic disc edema      PLAN:  OPTIC DISC EDEMA + HEADACHES (suspect idiopathic intracranial hypertension (pseudotumor cerebri)) - check lumbar puncture to measure opening pressure; patient is reluctant due to history of back surgery (Feb 2021) - consider to start topiramate; may change to acetazolamide if needed (based on symptoms, papilledema progression / stability and LP results) - follow up with ophthalmology (Dr. Daphine Deutscher) in 3-6 months  Return in about 6 months (around 03/10/2020).    Suanne Marker, MD 09/08/2019, 3:40 PM Certified in Neurology, Neurophysiology and Neuroimaging  [facility] [address], [address] [place], [place] [address] [phone]

## 2019-09-08 NOTE — Patient Instructions (Addendum)
  OPTIC DISC EDEMA + HEADACHES (suspect idiopathic intracranial hypertension (pseudotumor cerebri))  - consider lumbar puncture to measure opening pressure  - consider to start topiramate; may change to acetazolamide if needed  - follow up with ophthalmology (Dr. Daphine Deutscher) in 3-6 months

## 2019-09-09 ENCOUNTER — Encounter (HOSPITAL_COMMUNITY): Payer: Medicaid Other

## 2019-09-14 ENCOUNTER — Inpatient Hospital Stay: Admission: RE | Admit: 2019-09-14 | Payer: Medicaid Other | Source: Ambulatory Visit

## 2019-09-17 ENCOUNTER — Encounter (HOSPITAL_COMMUNITY): Payer: Self-pay

## 2019-09-17 ENCOUNTER — Ambulatory Visit (HOSPITAL_COMMUNITY): Payer: Medicaid Other

## 2019-09-17 ENCOUNTER — Telehealth (HOSPITAL_COMMUNITY): Payer: Self-pay

## 2019-09-17 NOTE — Telephone Encounter (Signed)
pt cancelled for today because she has a flare up and is in pain

## 2019-09-22 ENCOUNTER — Encounter (HOSPITAL_COMMUNITY): Payer: Medicaid Other | Admitting: Physical Therapy

## 2019-09-28 ENCOUNTER — Encounter (HOSPITAL_COMMUNITY): Payer: Medicaid Other | Admitting: Physical Therapy

## 2019-11-08 ENCOUNTER — Encounter: Payer: Self-pay | Admitting: Women's Health

## 2019-11-08 ENCOUNTER — Ambulatory Visit (INDEPENDENT_AMBULATORY_CARE_PROVIDER_SITE_OTHER): Payer: Medicaid Other | Admitting: Women's Health

## 2019-11-08 ENCOUNTER — Other Ambulatory Visit: Payer: Self-pay

## 2019-11-08 VITALS — BP 109/70 | HR 79 | Ht 66.0 in | Wt 207.0 lb

## 2019-11-08 DIAGNOSIS — Z3202 Encounter for pregnancy test, result negative: Secondary | ICD-10-CM | POA: Diagnosis not present

## 2019-11-08 DIAGNOSIS — Z30432 Encounter for removal of intrauterine contraceptive device: Secondary | ICD-10-CM

## 2019-11-08 DIAGNOSIS — Z30018 Encounter for initial prescription of other contraceptives: Secondary | ICD-10-CM

## 2019-11-08 LAB — POCT URINE PREGNANCY: Preg Test, Ur: NEGATIVE

## 2019-11-08 MED ORDER — PHEXXI 1.8-1-0.4 % VA GEL: VAGINAL | 11 refills | Status: DC

## 2019-11-08 NOTE — Progress Notes (Signed)
   IUD REMOVAL  Patient name: Lisa Christian MRN [id number]  Date of birth: 04-05-1990 Subjective Findings:   Lisa Christian is a 28 y.o. G25P1001 Caucasian female being seen today for removal of a Liletta  IUD. Her IUD was placed 02/24/18.  She desires removal because of ?nerve pain r/t IUD, also opthlamic nerve swelling- which opthamologist said could potentially be r/t IUD and recommended having it removed. Signed copy of informed consent in chart.  Depression screen [facility] 2/9 02/02/2018  Decreased Interest 0  Down, Depressed, Hopeless 0  PHQ - 2 Score 0    No LMP recorded. (Menstrual status: IUD). Last pap1/13/20. Results were:  normal The planned method of family planning is discussed options, wants to try phexxi since it is non-hormonal, may go back to pills later, will let us know Pertinent History Reviewed:   Reviewed past medical,surgical, social, obstetrical and family history.  Reviewed problem list, medications and allergies. Objective Findings & Procedure:    Vitals:   11/08/19 0850  BP: 109/70  Pulse: 79  Weight: 207 lb (93.9 kg)  Height: 5\' 6"  (1.676 m)  Body mass index is 33.41 kg/m.  Results for orders placed or performed in visit on 11/08/19 (from the past 24 hour(s))  POCT urine pregnancy   Collection Time: 11/08/19  8:55 AM  Result Value Ref Range   Preg Test, Ur Negative Negative     Time out was performed. Procedure performed by Dr. 11/10/19 A graves speculum was placed in the vagina.  The cervix was visualized, and the strings were visible. They were grasped and the Liletta  IUD was easily removed intact without complications. The patient tolerated the procedure well.   Chaperone: Nicki [place]   Assessment & Plan:   1) Liletta  IUD removal Follow-up prn problems  2) Contraception management> rx phexxi, 2 sample boxes given, let [name] know if wants to go back to pills  Orders Placed This Encounter  Procedures  . POCT urine pregnancy    Follow-up:  Return in about 1 year (around 11/07/2020) for Physical.  11/09/2020 CNM, WHNP-BC 11/08/2019 9:25 AM

## 2020-01-03 ENCOUNTER — Ambulatory Visit: Payer: Medicaid Other | Admitting: Diagnostic Neuroimaging

## 2020-01-03 ENCOUNTER — Telehealth: Payer: Self-pay | Admitting: *Deleted

## 2020-01-03 NOTE — Telephone Encounter (Signed)
Patient was no show for follow up today. 

## 2020-03-01 ENCOUNTER — Other Ambulatory Visit: Payer: Self-pay

## 2020-03-01 ENCOUNTER — Ambulatory Visit
Admission: EM | Admit: 2020-03-01 | Discharge: 2020-03-01 | Disposition: A | Discharge status: Home / Self Care | Payer: Medicaid Other | Attending: Family Medicine | Admitting: Family Medicine

## 2020-03-01 DIAGNOSIS — J0110 Acute frontal sinusitis, unspecified: Secondary | ICD-10-CM | POA: Diagnosis not present

## 2020-03-01 MED ORDER — FLUCONAZOLE 150 MG PO TABS: ORAL_TABLET | ORAL | 0 refills | Status: DC

## 2020-03-01 MED ORDER — AMOXICILLIN-POT CLAVULANATE 875-125 MG PO TABS: 1.0000 | ORAL_TABLET | Freq: Two times a day (BID) | ORAL | 0 refills | Status: AC

## 2020-03-01 NOTE — ED Provider Notes (Incomplete)
[facility]    CSN: [id number] Arrival date & time: 03/01/20  1916      History   Chief Complaint Chief Complaint  Patient presents with  . Nasal Congestion    HPI Lisa Christian is a 30 y.o. female.   HPI  Past Medical History:  Diagnosis Date  . Anxiety   . Family history of adverse reaction to anesthesia    " not sure why second cousin can't get put to sleep."  . GERD (gastroesophageal reflux disease)   . Headache   . HNP (herniated nucleus pulposus), lumbar   . Panic attack   . Panic attacks   . Vitamin D deficiency     Patient Active Problem List   Diagnosis Date Noted  . HNP (herniated nucleus pulposus), lumbar 03/01/2019  . Shoulder dystocia during labor and delivery, delivered 02/14/2015  . CIN I (cervical intraepithelial neoplasia I) 04/25/2014  . GERD (gastroesophageal reflux disease) 11/24/2013  . 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 Regurgitation 11/24/2013    Past Surgical History:  Procedure Laterality Date  . BIOPSY N/A 01/11/2014   Procedure: BIOPSY;  Surgeon: West Bali, MD;  Location: [facility];  Service: Endoscopy;  Laterality: N/A;  duodenal and gastric  . ESOPHAGOGASTRODUODENOSCOPY (EGD) WITH PROPOFOL N/A 01/11/2014   UMP:[id number] non-erosive  . LUMBAR LAMINECTOMY/DECOMPRESSION MICRODISCECTOMY Left 03/01/2019   Procedure: Microdiscectomy - Lumbar Four-Lumbar Five - left;  Surgeon: Donalee Citrin, MD;  Location: [facility];  Service: Neurosurgery;  Laterality: Left;  Microdiscectomy - Lumbar Four-Lumbar Five - left  . WISDOM TOOTH EXTRACTION      OB History    Gravida  1   Para  1   Term  1   Preterm      AB      Living  1     SAB      IAB      Ectopic      Multiple  0   Live Births  1            Home Medications    Prior to Admission medications   Medication Sig Start Date End Date Taking? Authorizing Provider  acetaminophen (TYLENOL) 500 MG tablet Take 2 tablets (1,000 mg total) by mouth every 6 (six)  hours as needed for mild pain or moderate pain. 02/27/19   Caccavale, Sophia, PA-C  Lactic Ac-Citric Ac-Pot Bitart (PHEXXI) 1.8-1-0.4 % GEL Insert 1 applicatorful vaginally up to an hour before sex 11/08/19   Cheral Marker, CNM  meloxicam (MOBIC) 7.5 MG tablet Take 7.5 mg by mouth as needed for pain.    [provider]  methocarbamol (ROBAXIN) 500 MG tablet Take 1 tablet (500 mg total) by mouth 2 (two) times daily. 02/27/19   Caccavale, Sophia, PA-C  Vitamin D, Ergocalciferol, (DRISDOL) 1.25 MG (50000 UNIT) CAPS capsule Take 50,000 Units by mouth once a week. Patient not taking: Reported on 09/08/2019 01/31/19   [provider]    Family History Family History  Problem Relation Age of Onset  . Anxiety disorder Mother   . Heart disease Maternal Grandfather   . Cancer Maternal Grandmother        breast  . Colon cancer Other     Social History Social History   Tobacco Use  . Smoking status: Never Smoker  . Smokeless tobacco: Never Used  Vaping Use  . Vaping Use: Never used  Substance Use Topics  . Alcohol use: Not Currently  . Drug use: No  Allergies   Latex   Review of Systems Review of Systems   Physical Exam Triage Vital Signs ED Triage Vitals  Enc Vitals Group     BP 03/01/20 1950 122/79     Pulse Rate 03/01/20 1950 (!) 103     Resp 03/01/20 1950 16     Temp 03/01/20 1950 99.4 F (37.4 C)     Temp Source 03/01/20 1950 Oral     SpO2 03/01/20 1950 97 %     Weight --      Height --      Head Circumference --      Peak Flow --      Pain Score 03/01/20 2001 0     Pain Loc --      Pain Edu? --      Excl. in GC? --    No data found.  Updated Vital Signs BP 122/79 (BP Location: Right Arm)   Pulse (!) 103   Temp 99.4 F (37.4 C) (Oral)   Resp 16   SpO2 97%   Visual Acuity Right Eye Distance:   Left Eye Distance:   Bilateral Distance:    Right Eye Near:   Left Eye Near:    Bilateral Near:     Physical Exam   UC Treatments /  Results  Labs (all labs ordered are listed, but only abnormal results are displayed) Labs Reviewed - No data to display  EKG   Radiology No results found.  Procedures Procedures (including critical care time)  Medications Ordered in UC Medications - No data to display  Initial Impression / Assessment and Plan / UC Course  I have reviewed the triage vital signs and the nursing notes.  Pertinent labs & imaging results that were available during my care of the patient were reviewed by me and considered in my medical decision making (see chart for details).     *** Final Clinical Impressions(s) / UC Diagnoses   Final diagnoses:  None   Discharge Instructions   None    ED Prescriptions    None     PDMP not reviewed this encounter.

## 2020-03-01 NOTE — Discharge Instructions (Signed)
I have sent in Augmentin for you to take twice a day for 7 days.  I have sent in fluconazole in case of yeast. Take one tablet at the onset of symptoms. If symptoms are still present in 3 days, take the second tablet.   Follow up with this office or with primary care if symptoms are persisting.  Follow up in the ER for high fever, trouble swallowing, trouble breathing, other concerning symptoms.  

## 2020-03-01 NOTE — ED Triage Notes (Signed)
Pt presents with nasal congestion and fatigue that developed after having covid 4 weeks ago

## 2020-03-02 NOTE — ED Provider Notes (Signed)
[facility]   [id number] 03/01/20 Arrival Time: 1916  EL:FYBO THROAT  SUBJECTIVE: History from: patient.  Lisa Christian is a 30 y.o. female who presents with abrupt onset of nasal congestion, headache, fatigue for the last week. Reports that she had Covid about a month ago. Has not had Covid vaccines. Has tried mucinex without relief. There are no aggravating symptoms. Denies previous symptoms in the past.     Denies fever, chills, ear pain, SOB, wheezing, chest pain, nausea, rash, changes in bowel or bladder habits.     ROS: As per HPI.  All other pertinent ROS negative.     Past Medical History:  Diagnosis Date  . Anxiety   . Family history of adverse reaction to anesthesia    " not sure why second cousin can't get put to sleep."  . GERD (gastroesophageal reflux disease)   . Headache   . HNP (herniated nucleus pulposus), lumbar   . Panic attack   . Panic attacks   . Vitamin D deficiency    Past Surgical History:  Procedure Laterality Date  . BIOPSY N/A 01/11/2014   Procedure: BIOPSY;  Surgeon: West Bali, MD;  Location: [facility];  Service: Endoscopy;  Laterality: N/A;  duodenal and gastric  . ESOPHAGOGASTRODUODENOSCOPY (EGD) WITH PROPOFOL N/A 01/11/2014   FBP:[id number] non-erosive  . LUMBAR LAMINECTOMY/DECOMPRESSION MICRODISCECTOMY Left 03/01/2019   Procedure: Microdiscectomy - Lumbar Four-Lumbar Five - left;  Surgeon: Donalee Citrin, MD;  Location: [facility] OR;  Service: Neurosurgery;  Laterality: Left;  Microdiscectomy - Lumbar Four-Lumbar Five - left  . WISDOM TOOTH EXTRACTION     Allergies  Allergen Reactions  . Latex     Vaginal irritation   No current facility-administered medications on file prior to encounter.   Current Outpatient Medications on File Prior to Encounter  Medication Sig Dispense Refill  . acetaminophen (TYLENOL) 500 MG tablet Take 2 tablets (1,000 mg total) by mouth every 6 (six) hours as needed for mild pain or moderate pain. 21 tablet 0   . Lactic Ac-Citric Ac-Pot Bitart (PHEXXI) 1.8-1-0.4 % GEL Insert 1 applicatorful vaginally up to an hour before sex 5 g 11  . meloxicam (MOBIC) 7.5 MG tablet Take 7.5 mg by mouth as needed for pain.    . methocarbamol (ROBAXIN) 500 MG tablet Take 1 tablet (500 mg total) by mouth 2 (two) times daily. 20 tablet 0  . Vitamin D, Ergocalciferol, (DRISDOL) 1.25 MG (50000 UNIT) CAPS capsule Take 50,000 Units by mouth once a week. (Patient not taking: Reported on 09/08/2019)     Social History   Socioeconomic History  . Marital status: Single    Spouse name: Not on file  . Number of children: 1  . Years of education: Not on file  . Highest education level: Associate degree: occupational, Scientist, product/process development, or vocational program  Occupational History  . Occupation: cosmetologist    Comment: at home  Tobacco Use  . Smoking status: Never Smoker  . Smokeless tobacco: Never Used  Vaping Use  . Vaping Use: Never used  Substance and Sexual Activity  . Alcohol use: Not Currently  . Drug use: No  . Sexual activity: Yes    Birth control/protection: I.U.D.  Other Topics Concern  . Not on file  Social History Narrative   Lives with child   Social Determinants of Health   Financial Resource Strain: Not on file  Food Insecurity: Not on file  Transportation Needs: Not on file  Physical Activity: Not on file  Stress: Not on file  Social Connections: Not on file  Intimate Partner Violence: Not on file   Family History  Problem Relation Age of Onset  . Anxiety disorder Mother   . Heart disease Maternal Grandfather   . Cancer Maternal Grandmother        breast  . Colon cancer Other     OBJECTIVE:  Vitals:   03/01/20 1950  BP: 122/79  Pulse: (!) 103  Resp: 16  Temp: 99.4 F (37.4 C)  TempSrc: Oral  SpO2: 97%     General appearance: alert; appears fatigued, but nontoxic, speaking in full sentences and managing own secretions HEENT: NCAT; Ears: EACs clear, TMs pearly gray with visible cone  of light, without erythema; Eyes: PERRL, EOMI grossly; Nose: no obvious rhinorrhea; Throat: oropharynx mildly erythematous, cobblestoning present, tonsils 1+ and mildly erythematous without white tonsillar exudates, uvula midline; Sinuses: maxillary sinuses tender to palpation Neck: supple without LAD Lungs: CTA bilaterally without adventitious breath sounds; cough absent Heart: regular rate and rhythm.  Radial pulses 2+ symmetrical bilaterally Skin: warm and dry Psychological: alert and cooperative; normal mood and affect  LABS: No results found for this or any previous visit (from the past 24 hour(s)).   ASSESSMENT & PLAN:  1. Acute non-recurrent frontal sinusitis     Meds ordered this encounter  Medications  . amoxicillin-clavulanate (AUGMENTIN) 875-125 MG tablet    Sig: Take 1 tablet by mouth 2 (two) times daily for 7 days.    Dispense:  14 tablet    Refill:  0    Order Specific Question:   Supervising Provider    Answer:   Merrilee Jansky [id number]  . fluconazole (DIFLUCAN) 150 MG tablet    Sig: Take one tablet at the onset of symptoms. If symptoms are still present 3 days later, take the second tablet.    Dispense:  2 tablet    Refill:  0    Order Specific Question:   Supervising Provider    Answer:   Merrilee Jansky [id number]    Acute Sinusitis Push fluids and get rest Prescribed amoxicillin 875mg  twice daily for 7 days.  Prescribed fluconazole in case of yeast  Take as directed and to completion.  Drink warm or cool liquids, use throat lozenges, or popsicles to help alleviate symptoms Take OTC ibuprofen or tylenol as needed for pain May use Zyrtec D and flonase to help alleviate symptoms Follow up with PCP if symptoms persist Return or go to ER if you have any new or worsening symptoms such as fever, chills, nausea, vomiting, worsening sore throat, cough, abdominal pain, chest pain, changes in bowel or bladder habits.   Reviewed expectations re: course of  current medical issues. Questions answered. Outlined signs and symptoms indicating need for more acute intervention. Patient verbalized understanding. After Visit Summary given.          , NP 03/02/20 [phone]

## 2020-05-02 ENCOUNTER — Encounter (HOSPITAL_COMMUNITY): Payer: Self-pay

## 2020-05-02 ENCOUNTER — Other Ambulatory Visit: Payer: Self-pay

## 2020-05-02 ENCOUNTER — Emergency Department (HOSPITAL_COMMUNITY)
Admission: EM | Admit: 2020-05-02 | Discharge: 2020-05-03 | Disposition: A | Discharge status: Home / Self Care | Payer: Medicaid Other | Attending: Emergency Medicine | Admitting: Emergency Medicine

## 2020-05-02 DIAGNOSIS — K029 Dental caries, unspecified: Secondary | ICD-10-CM | POA: Diagnosis not present

## 2020-05-02 DIAGNOSIS — U071 COVID-19: Secondary | ICD-10-CM | POA: Insufficient documentation

## 2020-05-02 DIAGNOSIS — K0889 Other specified disorders of teeth and supporting structures: Secondary | ICD-10-CM | POA: Diagnosis present

## 2020-05-02 DIAGNOSIS — Z91040 Latex allergy status: Secondary | ICD-10-CM | POA: Insufficient documentation

## 2020-05-02 DIAGNOSIS — K047 Periapical abscess without sinus: Secondary | ICD-10-CM

## 2020-05-02 LAB — CBC WITH DIFFERENTIAL/PLATELET
Abs Immature Granulocytes: 0.05 10*3/uL (ref 0.00–0.07)
Basophils Absolute: 0.1 10*3/uL (ref 0.0–0.1)
Basophils Relative: 1 %
Eosinophils Absolute: 0.2 10*3/uL (ref 0.0–0.5)
Eosinophils Relative: 1 %
HCT: 38.6 % (ref 36.0–46.0)
Hemoglobin: 13 g/dL (ref 12.0–15.0)
Immature Granulocytes: 0 %
Lymphocytes Relative: 16 %
Lymphs Abs: 2.2 10*3/uL (ref 0.7–4.0)
MCH: 31.6 pg (ref 26.0–34.0)
MCHC: 33.7 g/dL (ref 30.0–36.0)
MCV: 93.9 fL (ref 80.0–100.0)
Monocytes Absolute: 1 10*3/uL (ref 0.1–1.0)
Monocytes Relative: 7 %
Neutro Abs: 10.6 10*3/uL — ABNORMAL HIGH (ref 1.7–7.7)
Neutrophils Relative %: 75 %
Platelets: 260 10*3/uL (ref 150–400)
RBC: 4.11 MIL/uL (ref 3.87–5.11)
RDW: 12 % (ref 11.5–15.5)
WBC: 14 10*3/uL — ABNORMAL HIGH (ref 4.0–10.5)
nRBC: 0 % (ref 0.0–0.2)

## 2020-05-02 LAB — BASIC METABOLIC PANEL
Anion gap: 4 — ABNORMAL LOW (ref 5–15)
BUN: 5 mg/dL — ABNORMAL LOW (ref 6–20)
CO2: 26 mmol/L (ref 22–32)
Calcium: 8.9 mg/dL (ref 8.9–10.3)
Chloride: 106 mmol/L (ref 98–111)
Creatinine, Ser: 0.69 mg/dL (ref 0.44–1.00)
GFR, Estimated: >60 mL/min (ref >60)
Glucose, Bld: 104 mg/dL — ABNORMAL HIGH (ref 70–99)
Potassium: 3.8 mmol/L (ref 3.5–5.1)
Sodium: 136 mmol/L (ref 135–145)

## 2020-05-02 MED ORDER — AMOXICILLIN-POT CLAVULANATE 875-125 MG PO TABS: 1.0000 | ORAL_TABLET | Freq: Two times a day (BID) | ORAL | 0 refills | Status: DC

## 2020-05-02 NOTE — Discharge Instructions (Addendum)
Please take antibiotics as prescribed.  Please follow-up with the dentist. Please use Tylenol or ibuprofen for pain.  You may use 600 mg ibuprofen every 6 hours or 1000 mg of Tylenol every 6 hours.  You may choose to alternate between the 2.  This would be most effective.  Not to exceed 4 g of Tylenol within 24 hours.  Not to exceed 3200 mg ibuprofen 24 hours.   I have given you the information for our dentist on call for follow-up if needed.  PLEASE USE ORAJEL (bought over the counter at [facility])

## 2020-05-02 NOTE — ED Provider Notes (Signed)
[facility] EMERGENCY DEPARTMENT Provider Note   CSN: [id number] Arrival date & time: 05/02/20  1851     History Chief Complaint  Patient presents with  . Dental Pain    Lisa Christian is a 30 y.o. female.  HPI Patient is a 30 year old female with past medical history significant for poor dentition.  She is presented today with right upper dental pain for the past 6 months.  She states that she has had dental work done including a crown placed however she states the crown fell out.  She then had COVID-19 and was unable to be scheduled for dental surgery for quite some time.  She states she was doing well does not seem to be having any pain but then suddenly pain came back over the past couple days.  She states it is achy constant worse with touch and movement he states that it hurts on the right side of her face.  She denies any sore throat nausea vomiting fevers or chills.  Denies difficulty swallowing no tongue swelling or shortness of breath chest pain lightheadedness or dizziness.  She states she otherwise feels well.  States her pain is 5/10 presently.  She is taken no medications for pain prior to ER visit.  No other associate symptoms.  No aggravating mitigating factors apart from worse with touch.     Past Medical History:  Diagnosis Date  . Anxiety   . Family history of adverse reaction to anesthesia    " not sure why second cousin can't get put to sleep."  . GERD (gastroesophageal reflux disease)   . Headache   . HNP (herniated nucleus pulposus), lumbar   . Panic attack   . Panic attacks   . Vitamin D deficiency     Patient Active Problem List   Diagnosis Date Noted  . HNP (herniated nucleus pulposus), lumbar 03/01/2019  . Shoulder dystocia during labor and delivery, delivered 02/14/2015  . CIN I (cervical intraepithelial neoplasia I) 04/25/2014  . GERD (gastroesophageal reflux disease) 11/24/2013  . 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  Regurgitation 11/24/2013    Past Surgical History:  Procedure Laterality Date  . BIOPSY N/A 01/11/2014   Procedure: BIOPSY;  Surgeon: West Bali, MD;  Location: [facility];  Service: Endoscopy;  Laterality: N/A;  duodenal and gastric  . ESOPHAGOGASTRODUODENOSCOPY (EGD) WITH PROPOFOL N/A 01/11/2014   ACZ:[id number] non-erosive  . LUMBAR LAMINECTOMY/DECOMPRESSION MICRODISCECTOMY Left 03/01/2019   Procedure: Microdiscectomy - Lumbar Four-Lumbar Five - left;  Surgeon: Donalee Citrin, MD;  Location: [facility] OR;  Service: Neurosurgery;  Laterality: Left;  Microdiscectomy - Lumbar Four-Lumbar Five - left  . WISDOM TOOTH EXTRACTION       OB History    Gravida  1   Para  1   Term  1   Preterm      AB      Living  1     SAB      IAB      Ectopic      Multiple  0   Live Births  1           Family History  Problem Relation Age of Onset  . Anxiety disorder Mother   . Heart disease Maternal Grandfather   . Cancer Maternal Grandmother        breast  . Colon cancer Other     Social History   Tobacco Use  . Smoking status: Never Smoker  . Smokeless tobacco: Never Used  Vaping Use  . Vaping Use: Never used  Substance Use Topics  . Alcohol use: Not Currently  . Drug use: No    Home Medications Prior to Admission medications   Medication Sig Start Date End Date Taking? Authorizing Provider  acetaminophen (TYLENOL) 500 MG tablet Take 2 tablets (1,000 mg total) by mouth every 6 (six) hours as needed for mild pain or moderate pain. 02/27/19  Yes Caccavale, Sophia, PA-C  amoxicillin-clavulanate (AUGMENTIN) 875-125 MG tablet Take 1 tablet by mouth every 12 (twelve) hours. 05/02/20  Yes Denario Bagot, Stevphen Meuse S, PA  Cholecalciferol (VITAMIN D3) 10 MCG/ML LIQD Take 2 drops by mouth daily.   Yes [provider]  ibuprofen (ADVIL) 800 MG tablet Take 800 mg by mouth every 8 (eight) hours as needed for moderate pain.   Yes [provider]  fluconazole (DIFLUCAN) 150 MG tablet  Take one tablet at the onset of symptoms. If symptoms are still present 3 days later, take the second tablet. Patient not taking: No sig reported 03/01/20   Moshe Cipro, NP  Lactic Ac-Citric Ac-Pot Bitart (PHEXXI) 1.8-1-0.4 % GEL Insert 1 applicatorful vaginally up to an hour before sex Patient not taking: No sig reported 11/08/19   Cheral Marker, CNM  methocarbamol (ROBAXIN) 500 MG tablet Take 1 tablet (500 mg total) by mouth 2 (two) times daily. Patient not taking: No sig reported 02/27/19   Caccavale, Sophia, PA-C    Allergies    Latex  Review of Systems   Review of Systems  Constitutional: Negative for fever.  HENT: Positive for dental problem. Negative for congestion.   Respiratory: Negative for shortness of breath.   Cardiovascular: Negative for chest pain.  Gastrointestinal: Negative for abdominal distention.  Neurological: Negative for dizziness and headaches.    Physical Exam Updated Vital Signs BP 116/75   Pulse 75   Temp 98.1 F (36.7 C) (Oral)   Resp 18   Ht 5\' 5"  (1.651 m)   Wt 85.7 kg   SpO2 100%   BMI 31.45 kg/m   Physical Exam Vitals and nursing note reviewed.  Constitutional:      General: She is not in acute distress.    Appearance: Normal appearance. She is not ill-appearing.  HENT:     Head: Normocephalic and atraumatic.     Mouth/Throat:     Comments: Poor dentition throughout.  Right upper dental pain with no focal abscess.  There are however completely eroded teeth present in the right upper dental region. Full range of motion of tongue and no sublingual tenderness palpation. Eyes:     General: No scleral icterus.       Right eye: No discharge.        Left eye: No discharge.     Conjunctiva/sclera: Conjunctivae normal.  Pulmonary:     Effort: Pulmonary effort is normal.     Breath sounds: No stridor.  Neurological:     Mental Status: She is alert and oriented to person, place, and time. Mental status is at baseline.     ED  Results / Procedures / Treatments   Labs (all labs ordered are listed, but only abnormal results are displayed) Labs Reviewed  BASIC METABOLIC PANEL - Abnormal; Notable for the following components:      Result Value   Glucose, Bld 104 (*)    BUN 5 (*)    Anion gap 4 (*)    All other components within normal limits  CBC WITH DIFFERENTIAL/PLATELET - Abnormal; Notable for the  following components:   WBC 14.0 (*)    Neutro Abs 10.6 (*)    All other components within normal limits    EKG None  Radiology No results found.  Procedures Procedures   Medications Ordered in ED Medications - No data to display  ED Course  I have reviewed the triage vital signs and the nursing notes.  Pertinent labs & imaging results that were available during my care of the patient were reviewed by me and considered in my medical decision making (see chart for details).    MDM Rules/Calculators/A&P                          Patient with severe dental pain.  This is been an ongoing issue for her.  She has a Education officer, community already established.  She has taken medications prior to arrival.  Recommended Tylenol ibuprofen and Orajel.  Her physical exam is remarkably reassuring given the degree of dental cavities present she does not seem to have any dental abscess or fluctuance of the gumline and has forage motion of tongue and no sublingual tenderness.  I doubt Ludwig, doubt RTA, PTA.  Discharged with Augmentin.  She will follow-up with her dentist.  Return precautions given  Final Clinical Impression(s) / ED Diagnoses Final diagnoses:  Infected dental caries    Rx / DC Orders ED Discharge Orders         Ordered    amoxicillin-clavulanate (AUGMENTIN) 875-125 MG tablet  Every 12 hours        05/02/20 2319           Gailen Shelter, [place] 05/04/20 1912    Charlynne Pander, MD 05/04/20 [phone]

## 2020-05-02 NOTE — ED Triage Notes (Signed)
Pt c/o dental pain. Pt has a broken tooth and is scheduled to have an extraction in June. Pain has increased and is now having pain in right ear and face.

## 2020-05-02 NOTE — ED Triage Notes (Signed)
Emergency Medicine Provider Triage Evaluation Note  Lisa Christian , a 30 y.o. female  was evaluated in triage.  Pt complains of right upper tooth pain for last 6 months, states over the last couple days pain is getting worse, has right-sided face pain pressure behind her right eye, and pressure in her throat.  Denies fevers, chills, difficulty swallowing, tongue or throat swelling, shortness breath or chest pain.  Has been unable to follow-up with her dentist for a dental extraction  Review of Systems  Positive: Right-sided face pain, pressure in her throat. Negative: Denies fevers, chills, tongue or throat swelling, difficulty swallowing, shortness of breath or chest pain  Physical Exam  BP 115/82 (BP Location: Left Arm)   Pulse 92   Temp 98.4 F (36.9 C) (Oral)   Resp 16   Ht 5\' 5"  (1.651 m)   Wt 85.7 kg   SpO2 98%   BMI 31.45 kg/m  Gen:   Awake, no distress   HEENT:  Atraumatic, tongue and uvula are both midline controlling secretions Resp:  Normal effort  Cardiac:  Normal rate  MSK:   Moves extremities without difficulty  Neuro:  Speech clear   Medical Decision Making  Medically screening exam initiated at 8:37 PM.  Appropriate orders placed.  KORINNE GREENSTEIN was informed that the remainder of the evaluation will be completed by another provider, this initial triage assessment does not replace that evaluation, and the importance of remaining in the ED until their evaluation is complete.  Clinical Impression  Patient presents with right upper dental pain, lab work has been ordered, patient will need further work-up here in the ED   Haywood Lasso, PA-C 05/02/20 2041

## 2020-08-31 ENCOUNTER — Other Ambulatory Visit: Payer: Self-pay

## 2020-08-31 ENCOUNTER — Ambulatory Visit (HOSPITAL_COMMUNITY): Payer: Medicaid Other | Attending: Neurosurgery | Admitting: Physical Therapy

## 2020-08-31 DIAGNOSIS — M6281 Muscle weakness (generalized): Secondary | ICD-10-CM | POA: Insufficient documentation

## 2020-08-31 DIAGNOSIS — M5416 Radiculopathy, lumbar region: Secondary | ICD-10-CM | POA: Insufficient documentation

## 2020-08-31 NOTE — Addendum Note (Signed)
Addended by: Bella Kennedy on: 08/31/2020 05:10 PM   Modules accepted: Orders

## 2020-08-31 NOTE — Therapy (Addendum)
[facility] [address] [place], [place], [address] Phone: [phone]   Fax:  [phone]  Physical Therapy Evaluation and Discharge Note  Patient Details  Name: CLARICE ZULAUF MRN: [id number] Date of Birth: [birth year] Referring Provider (PT): Kary Kos   PHYSICAL THERAPY DISCHARGE SUMMARY  Visits from Start of Care: 1  Current functional level related to goals / functional outcomes: Unable to assess due to unplanned discharge   Remaining deficits: Unable to assess due to unplanned discharge   Education / Equipment: Unable to assess due to unplanned discharge   Patient agrees to discharge. Patient goals were not met. Patient is being discharged due to not returning since the last visit. 2:33 PM, 09/19/20 [name], DPT Physical Therapy with [facility]  [phone] office    Encounter Date: 08/31/2020   PT End of Session - 08/31/20 1640     Visit Number 1    Number of Visits 8    Date for PT Re-Evaluation 10/06/20   PT will not start treatment until 8/17   Authorization Type medicaidhealthy blue- put aut in    Authorization - Number of Visits 27    Progress Note Due on Visit 8    PT Start Time 1540    PT Stop Time 1620    PT Time Calculation (min) 40 min    Activity Tolerance Patient tolerated treatment well    Behavior During Therapy [facility] for tasks assessed/performed;Anxious             Past Medical History:  Diagnosis Date   Anxiety    Family history of adverse reaction to anesthesia    " not sure why second cousin can't get put to sleep."   GERD (gastroesophageal reflux disease)    Headache    HNP (herniated nucleus pulposus), lumbar    Panic attack    Panic attacks    Vitamin D deficiency     Past Surgical History:  Procedure Laterality Date   BIOPSY N/A 01/11/2014   Procedure: BIOPSY;  Surgeon: Danie Binder, MD;  Location: [facility];  Service: Endoscopy;  Laterality: N/A;   duodenal and gastric   ESOPHAGOGASTRODUODENOSCOPY (EGD) WITH PROPOFOL N/A 01/11/2014   BWG:[id number]/VXBL non-erosive   LUMBAR LAMINECTOMY/DECOMPRESSION MICRODISCECTOMY Left 03/01/2019   Procedure: Microdiscectomy - Lumbar Four-Lumbar Five - left;  Surgeon: Kary Kos, MD;  Location: [facility];  Service: Neurosurgery;  Laterality: Left;  Microdiscectomy - Lumbar Four-Lumbar Five - left   WISDOM TOOTH EXTRACTION      There were no vitals filed for this visit.    Subjective Assessment - 08/31/20 1540     Subjective Ms. Littie Deeds states that she has been having back pain since her back surgery on 03/01/2019.  She states that she had been here before and the therapy made her worse.  She states that her MD sent her back her as this is the only facility that accepts medicaid.  She states her MD told her not to do any bridges.  She states that she quit doing exercises she felt better.  She states whenever she has a misstep she has increased pain.  In May she was walking on the ball field and stepped in a hole and she has had pain ever since. She would like to be with one or two therapist at the most.    Pertinent History previous back surgery, optic nerve swelling.    How long can you sit comfortably? varies; hard surfaces  is uncomfortable, softer is better    How long can you stand comfortably? good and bad days;  good day she can stand for about 30 minutes but on other days she has pain going down to her feet    How long can you walk comfortably? has shopped for 3 hours at  time.    Patient Stated Goals have less nerve pain.  to understand what exercises she can do without having increased pain.    Currently in Pain? Yes    Pain Score 4    lowest pain is a 4; highest pain is a 10/10   Pain Location Back    Pain Orientation Lower    Pain Descriptors / Indicators Aching;Shooting    Pain Type Chronic pain    Pain Radiating Towards Lt calf;  Rt goes to distal thigh.    Pain Onset More than a month ago    Pain  Frequency Intermittent    Aggravating Factors  sleeping in the wrong position, misstepping on the ground    Pain Relieving Factors heat and ice    Effect of Pain on Daily Activities limits                [facility] PT Assessment - 08/31/20 0001       Assessment   Medical Diagnosis B lumbar radiculopathy    Referring Provider (PT) Kary Kos    Onset Date/Surgical Date 03/01/19    Next MD Visit 09/23/2020    Prior Therapy yes increased pain      Precautions   Precautions --   no bridges per MD and pt     Restrictions   Weight Bearing Restrictions No      Balance Screen   Has the patient fallen in the past 6 months No    Has the patient had a decrease in activity level because of a fear of falling?  Yes    Is the patient reluctant to leave their home because of a fear of falling?  No      Home Ecologist residence    Home Access Stairs to enter    Entrance Stairs-Number of Steps 4      Prior Function   Level of Independence Independent      Cognition   Overall Cognitive Status Within Functional Limits for tasks assessed      ROM / Strength   AROM / PROM / Strength AROM;Strength      AROM   AROM Assessment Site Lumbar    Lumbar Flexion nt    Lumbar Extension 12   reps make legs feel tight.   Lumbar - Right Side Bend 18   reps has no change   Lumbar - Left Side Bend 18   no increase pain     Strength   Strength Assessment Site Hip;Knee;Ankle    Right/Left Hip Right;Left    Right Hip Extension 3-/5    Right Hip ABduction 5/5    Left Hip Extension 3-/5    Left Hip ABduction 5/5    Right/Left Knee Right;Left    Right Knee Flexion 4-/5    Right Knee Extension 5/5    Left Knee Flexion 4-/5    Left Knee Extension 5/5    Right/Left Ankle Right;Left    Right Ankle Dorsiflexion 5/5    Left Ankle Dorsiflexion 5/5      Flexibility   Soft Tissue Assessment /Muscle Length yes  Objective measurements  completed on examination: See above findings.       [facility] PT Treatment/Exercise - 08/31/20 0001       Exercises   Exercises Lumbar      Lumbar Exercises: Stretches   Active Hamstring Stretch Left;Right;3 reps;20 seconds      Lumbar Exercises: Supine   Ab Set 10 reps    Glut Set 10 reps                    PT Education - 08/31/20 1639     Education Details HEP    Person(s) Educated Patient    Methods Explanation    Comprehension Verbalized understanding              PT Short Term Goals - 08/31/20 1658       PT SHORT TERM GOAL #1   Title PT to be I in HEP to decrease nerve irritation as demonstrated by pt radicular pain on Rt to be to thigh only; LT to buttock    Time 2    Period Weeks    Status New    Target Date 09/20/20   PT wil not be starting therapy until the 17th     PT SHORT TERM GOAL #2   Title Patient will report at least 25% improvement in overall symptoms and/or function to demonstrate improved functional mobility    Time 2    Period Weeks    Status New               PT Long Term Goals - 08/31/20 1700       PT LONG TERM GOAL #1   Title Patient will be able to demonstrated at least 50% ROM in all directions of lumbar spine to demonstrate improved lumbar mobility    Time 4    Period Weeks    Status New    Target Date 10/04/20      PT LONG TERM GOAL #2   Title Patient will report at least 50% improvement in overall symptoms and/or function to demonstrate improved functional mobility    Time 4    Period Weeks    Status New      PT LONG TERM GOAL #3   Title PT mm strength in B LE to be at least a 4/5 to allow pt to be able to rise from a low cough without difficulty.    Time 6    Period Weeks    Status New                    Plan - 08/31/20 1647     Clinical Impression Statement Ms. Littie Deeds is a 30 yo female who had surgery in February of 2021.  She was referred to PT in June of 2021 but quit after 8 sessions  due to therapy making her worse.  She felt that the bridges caused her to have inreased radicular sx and that her MD agrees that this was to much to soon for her.  She is apprehensive about starting therapy again as she is fearful that is will increase her pain again, however, her MD requested her to try therapy, therefore she is going to.  Evaluation demonstrates that the pt is fearful of moving, she has weakened core and LE strength expecially gluteus maximus B. Ms. Littie Deeds will benefit from skilled PT to promote improved mobility, increased core and LE strength and decrease nerve irritation.  Pt needed two  pillow under abdomen to tolerate prone lying at evaluation.    Personal Factors and Comorbidities Behavior Pattern;Comorbidity 1;Fitness;Time since onset of injury/illness/exacerbation    Comorbidities back surgery, fear of reinjury    Examination-Activity Limitations Caring for Others;Carry;Lift;Locomotion Level;Stairs;Stand    Examination-Participation Restrictions Cleaning;Occupation;Yard Work;Shop    Stability/Clinical Decision Making Stable/Uncomplicated    Clinical Decision Making Moderate    Rehab Potential Fair    PT Frequency 2x / week    PT Duration 4 weeks    PT Treatment/Interventions Patient/family education;Therapeutic activities;Stair training;Gait training;Functional mobility training;Moist Heat;Therapeutic exercise;Manual techniques    PT Next Visit Plan Gently progress core and LE exercises, and  stretching to improve strength and flexibility to improve functional tolerance and decrease pain.    PT Home Exercise Plan Eval:  ab set, glut set active hamsring stretch.             Patient will benefit from skilled therapeutic intervention in order to improve the following deficits and impairments:  Decreased activity tolerance, Decreased range of motion, Decreased strength, Pain, Difficulty walking, Impaired perceived functional ability  Visit Diagnosis: Radiculopathy, lumbar  region  Muscle weakness (generalized)     Problem List Patient Active Problem List   Diagnosis Date Noted   HNP (herniated nucleus pulposus), lumbar 03/01/2019   Shoulder dystocia during labor and delivery, delivered 02/14/2015   CIN I (cervical intraepithelial neoplasia I) 04/25/2014   GERD (gastroesophageal reflux disease) 11/24/2013   Globus hystericus 11/24/2013   Regurgitation 11/24/2013   Rayetta Humphrey, PT CLT [phone]  08/31/2020, 5:04 PM  [facility] [place], [place], [address] Phone: [phone]   Fax:  [phone]  Name: KEELEY SUSSMAN MRN: [id number] Date of Birth: 07/02/1990

## 2020-09-06 ENCOUNTER — Ambulatory Visit (HOSPITAL_COMMUNITY): Payer: Medicaid Other | Admitting: Physical Therapy

## 2020-09-12 ENCOUNTER — Telehealth (HOSPITAL_COMMUNITY): Payer: Self-pay | Admitting: Physical Therapy

## 2020-09-12 ENCOUNTER — Encounter (HOSPITAL_COMMUNITY): Payer: Medicaid Other | Admitting: Physical Therapy

## 2020-09-12 NOTE — Telephone Encounter (Signed)
Called about missed appt, voice mail box full and unable to leave message. Next appt is Th 8/25 at 4:15.  1:20 PM,09/12/20 Esmeralda Links, PT, DPT Physical Therapist at [facility]

## 2020-09-14 ENCOUNTER — Ambulatory Visit (HOSPITAL_COMMUNITY): Payer: Medicaid Other | Admitting: Physical Therapy

## 2020-09-14 ENCOUNTER — Telehealth (HOSPITAL_COMMUNITY): Payer: Self-pay | Admitting: Physical Therapy

## 2020-09-14 NOTE — Telephone Encounter (Signed)
Pt did not show for appointment today (2nd consecutive NS).  Unable to reach by phone as voicemail was full.  Per NS policy all appointments canceled except her next app on Tuesday, 9/30.  Lurena Nida, PTA/CLT [phone]

## 2020-09-19 ENCOUNTER — Ambulatory Visit (HOSPITAL_COMMUNITY): Payer: Medicaid Other | Admitting: Physical Therapy

## 2020-09-19 ENCOUNTER — Telehealth (HOSPITAL_COMMUNITY): Payer: Self-pay | Admitting: Physical Therapy

## 2020-09-19 NOTE — Telephone Encounter (Signed)
No Show. Called patient about missed apt and NS/Cancellation policy but mailbox full and unable to leave VM. Patient to be discharged from PT at this time secondary to non compliance with PT POC. 2:32 PM, 09/19/20 Tereasa Coop, DPT Physical Therapy with [facility]  [phone] office

## 2020-09-21 ENCOUNTER — Encounter (HOSPITAL_COMMUNITY): Payer: Medicaid Other | Admitting: Physical Therapy

## 2020-09-26 ENCOUNTER — Encounter (HOSPITAL_COMMUNITY): Payer: Medicaid Other | Admitting: Physical Therapy

## 2020-09-28 ENCOUNTER — Encounter (HOSPITAL_COMMUNITY): Payer: Medicaid Other

## 2021-04-17 ENCOUNTER — Ambulatory Visit: Payer: Medicaid Other | Admitting: Cardiology

## 2021-04-17 ENCOUNTER — Other Ambulatory Visit: Payer: Self-pay

## 2021-04-17 ENCOUNTER — Encounter: Payer: Self-pay | Admitting: Cardiology

## 2021-04-17 VITALS — BP 105/69 | HR 63 | Temp 98.2°F | Resp 10 | Ht 65.0 in | Wt 172.0 lb

## 2021-04-17 DIAGNOSIS — R072 Precordial pain: Secondary | ICD-10-CM

## 2021-04-17 DIAGNOSIS — R002 Palpitations: Secondary | ICD-10-CM

## 2021-04-17 DIAGNOSIS — R0602 Shortness of breath: Secondary | ICD-10-CM

## 2021-04-17 NOTE — Progress Notes (Signed)
? ?Date:  04/17/2021  ? ?ID:  Lisa Christian, DOB 16-Feb-1990, MRN [id number] ? ?[facility]  [facility]  ?Cardiologist:  Tessa LernerSunit Palmer Fahrner, DO, [facility] (established care 04/17/2021) ? ?REASON FOR CONSULT: Palpitations. ? ?REQUESTING PHYSICIAN:  ?[facility] ?[address] ?[place],  [place] [id number] ? ?Chief Complaint  ?Patient presents with  ? Palpitations  ? Chest Pain  ? ? ?HPI  ?Lisa Christian is a 31 y.o. Caucasian female who does not have any significant past cardiac history presents with a chief complaint of chest pain and palpitations. ? ?She is referred to the office at the request of [facility] for evaluation of palpitations. ? ?Palpitations: ?Symptoms usually were occurring twice every 6 months which she was attributing to her underlying anxiety.  However over the last 2 weeks the symptoms have been more prominent, duration less than a minute, at least once a day, no improving or worsening factors, occurs randomly, self-limited.  No near-syncope or syncopal event.  Does not consume caffeinated beverages, alcohol, illicit drugs, energy drinks, weight loss supplements, stimulants.  Patient recently had her labs performed and informs me that her thyroid and hemoglobin are within normal limits.  I do not have these labs available for independent review as part of today's encounter. ? ?Precordial discomfort: ?Has been experiencing substernal discomfort, ache-like sensation, few minutes in duration, nonexertional, does not resolve with resting, self-limited.  The discomfort is not pleuritic or reproducible.  Confounding factors also include prior back surgeries and nerve pain between the bilateral shoulder girdles. ? ?Patient informs me that several years ago she was evaluated by cardiology and had undergone a Holter monitor and echo both which were unremarkable. ? ?FUNCTIONAL STATUS: ?No structured exercise program or daily routine.  ? ?ALLERGIES: ?Allergies  ?Allergen  Reactions  ? Latex   ?  Vaginal irritation  ? ? ?MEDICATION LIST PRIOR TO VISIT: ?No outpatient medications have been marked as taking for the 04/17/21 encounter (Office Visit) with Tessa Lernerolia, Derryck Shahan, DO.  ?  ? ?PAST MEDICAL HISTORY: ?Past Medical History:  ?Diagnosis Date  ? Anxiety   ? Family history of adverse reaction to anesthesia   ? " not sure why second cousin can't get put to sleep."  ? GERD (gastroesophageal reflux disease)   ? Headache   ? HNP (herniated nucleus pulposus), lumbar   ? Panic attack   ? Panic attacks   ? Vitamin D deficiency   ? ? ?PAST SURGICAL HISTORY: ?Past Surgical History:  ?Procedure Laterality Date  ? BIOPSY N/A 01/11/2014  ? Procedure: BIOPSY;  Surgeon: West BaliSandi L Fields, MD;  Location: [facility];  Service: Endoscopy;  Laterality: N/A;  duodenal and gastric  ? ESOPHAGOGASTRODUODENOSCOPY (EGD) WITH PROPOFOL N/A 01/11/2014  ? QIH:[id number]/OVFISLF:dyspepsia/mild non-erosive  ? LUMBAR LAMINECTOMY/DECOMPRESSION MICRODISCECTOMY Left 03/01/2019  ? Procedure: Microdiscectomy - Lumbar Four-Lumbar Five - left;  Surgeon: Donalee Citrinram, Gary, MD;  [facility];  Service: Neurosurgery;  Laterality: Left;  Microdiscectomy - Lumbar Four-Lumbar Five - left  ? WISDOM TOOTH EXTRACTION    ? ? ?FAMILY HISTORY: ?The patient family history includes Anxiety disorder in her mother; Cancer in her maternal grandmother; Colon cancer in an other family member; Heart disease in her maternal grandfather. ? ?SOCIAL HISTORY:  ?The patient  reports that she has never smoked. She has never used smokeless tobacco. She reports that she does not currently use alcohol. She reports that she does not use drugs. ? ?REVIEW OF SYSTEMS: ?Review of Systems  ?Cardiovascular:  Positive for chest pain and palpitations. Negative for claudication, dyspnea on exertion, leg swelling, near-syncope, orthopnea, paroxysmal nocturnal dyspnea and syncope.  ?Respiratory:  Positive for shortness of breath.   ? ?PHYSICAL EXAM: ? ?  04/17/2021  ?  2:47 PM 05/03/2020  ? 12:04 AM  05/02/2020  ?  8:31 PM  ?Vitals with BMI  ?Height 5\' 5"   5\' 5"   ?Weight 172 lbs  189 lbs  ?BMI 28.62  31.45  ?Systolic 105 116   ?Diastolic 69 75   ?Pulse 63 75   ? ? ?CONSTITUTIONAL: Well-developed and well-nourished. No acute distress.  ?SKIN: Skin is warm and dry. No rash noted. No cyanosis. No pallor. No jaundice ?HEAD: Normocephalic and atraumatic.  ?EYES: No scleral icterus ?MOUTH/THROAT: Moist oral membranes.  ?NECK: No JVD present. No thyromegaly noted. No carotid bruits  ?LYMPHATIC: No visible cervical adenopathy.  ?CHEST Normal respiratory effort. No intercostal retractions  ?LUNGS: Clear to auscultation bilaterally.  No stridor. No wheezes. No rales.  ?CARDIOVASCULAR: Regular rate and rhythm, positive S1-S2, no murmurs rubs or gallops appreciated. ?ABDOMINAL: Soft, nontender, distended, positive bowel sounds in all 4 quadrants, no apparent ascites.  ?EXTREMITIES: No peripheral edema, warm to touch, 2+ bilateral DP and PT pulses ?HEMATOLOGIC: No significant bruising ?NEUROLOGIC: Oriented to person, place, and time. Nonfocal. Normal muscle tone.  ?PSYCHIATRIC: Normal mood and affect. Normal behavior. Cooperative ? ?CARDIAC DATABASE: ?EKG: ?04/09/2021: Normal sinus rhythm, 62 bpm, without underlying ischemia or injury pattern. ? ?Echocardiogram: ?No results found for this or any previous visit from the past 1095 days. ?  ? ?Stress Testing: ?No results found for this or any previous visit from the past 1095 days. ? ? ?Heart Catheterization: ?None ? ?LABORATORY DATA: ? ?  Latest Ref Rng & Units 05/02/2020  ?  8:44 PM 03/01/2019  ?  6:37 AM 02/02/2018  ?  3:31 PM  ?CBC  ?WBC 4.0 - 10.5 K/uL 14.0   12.3   8.2    ?Hemoglobin 12.0 - 15.0 g/dL 04/29/2019   02/04/2018   73.4    ?Hematocrit 36.0 - 46.0 % 38.6   39.2   38.5    ?Platelets 150 - 400 K/uL 260   328   301    ? ? ? ?  Latest Ref Rng & Units 05/02/2020  ?  8:44 PM 03/01/2019  ?  6:37 AM 02/02/2018  ?  3:31 PM  ?CMP  ?Glucose 70 - 99 mg/dL 04/29/2019   99   02/04/2018    ?BUN 6 - 20 mg/dL 5    9   6     ?Creatinine 0.44 - 1.00 mg/dL 240   973      ?Sodium 135 - 145 mmol/L 136   139   138    ?Potassium 3.5 - 5.1 mmol/L 3.8   3.8   4.1    ?Chloride 98 - 111 mmol/L 106   103   102    ?CO2 22 - 32 mmol/L 26   25   20     ?Calcium 8.9 - 10.3 mg/dL 8.9   9.3   9.0    ?Total Protein 6.0 - 8.5 g/dL   7.0    ?Total Bilirubin 0.0 - 1.2 mg/dL   0.4    ?Alkaline Phos 39 - 117 IU/L   63    ?AST 0 - 40 IU/L   18    ?ALT 0 - 32 IU/L   17    ? ? ?Lipid Panel  ?No  results found for: CHOL, TRIG, HDL, CHOLHDL, VLDL, LDLCALC, LDLDIRECT, LABVLDL ? ?No components found for: NTPROBNP ?No results for input(s): PROBNP in the last 8760 hours. ?No results for input(s): TSH in the last 8760 hours. ? ?BMP ?Recent Labs  ?  05/02/20 ?2044  ?NA 136  ?K 3.8  ?CL 106  ?CO2 26  ?GLUCOSE 104*  ?BUN 5*  ?CREATININE 0.69  ?CALCIUM 8.9  ?GFRNONAA >60  ? ? ?HEMOGLOBIN A1C ?No results found for: HGBA1C, MPG ? ?IMPRESSION: ? ?  ICD-10-CM   ?1. Palpitations  R00.2 LONG TERM MONITOR (3-14 DAYS)  ?  ?2. Precordial pain  R07.2 PCV ECHOCARDIOGRAM COMPLETE  ?  PCV CARDIAC STRESS TEST  ?  ?3. Shortness of breath  R06.02   ?  ?  ? ?RECOMMENDATIONS: ?VENNESA BASTEDO is a 31 y.o. Caucasian female without past cardiac history presents for evaluation of precordial discomfort and palpitations. ? ?Palpitations: ?No identifiable reversible cause. ?Patient had outside labs which are not available for review at today's encounter-we will request the records. ?3-day extended Holter monitor to evaluate for underlying dysrhythmias. ?Denies any near-syncope or syncopal event. ? ? ?Precordial pain/shortness of breath: ?Patient's precordial discomfort appears to be noncardiac.  Ideally no additional cardiovascular testing is warranted as the symptoms are predominately noncardiac and no other prior cardiovascular risk factors.  However, given this discomfort patient has been really concerned for the last several weeks, has gone to the ED for evaluation as well as  PCP.  Patient requests to be reevaluated. ?Echo will be ordered to evaluate for structural heart disease and left ventricular systolic function. ?GXT to evaluate for functional status and exercise-induced arrhythmi

## 2021-04-19 ENCOUNTER — Other Ambulatory Visit: Payer: Self-pay | Admitting: *Deleted

## 2021-04-19 DIAGNOSIS — R1011 Right upper quadrant pain: Secondary | ICD-10-CM

## 2021-04-25 ENCOUNTER — Other Ambulatory Visit: Payer: Medicaid Other

## 2021-04-26 ENCOUNTER — Inpatient Hospital Stay: Payer: Medicaid Other

## 2021-04-26 ENCOUNTER — Ambulatory Visit: Payer: Medicaid Other

## 2021-04-26 DIAGNOSIS — R002 Palpitations: Secondary | ICD-10-CM

## 2021-04-26 DIAGNOSIS — R072 Precordial pain: Secondary | ICD-10-CM

## 2021-04-30 ENCOUNTER — Ambulatory Visit
Admission: RE | Admit: 2021-04-30 | Discharge: 2021-04-30 | Disposition: A | Discharge status: Home / Self Care | Payer: Medicaid Other | Source: Ambulatory Visit | Attending: *Deleted | Admitting: *Deleted

## 2021-04-30 DIAGNOSIS — R1011 Right upper quadrant pain: Secondary | ICD-10-CM

## 2021-05-04 ENCOUNTER — Encounter: Payer: Self-pay | Admitting: Women's Health

## 2021-05-04 ENCOUNTER — Ambulatory Visit (INDEPENDENT_AMBULATORY_CARE_PROVIDER_SITE_OTHER): Payer: Medicaid Other | Admitting: Women's Health

## 2021-05-04 ENCOUNTER — Other Ambulatory Visit (HOSPITAL_COMMUNITY)
Admission: RE | Admit: 2021-05-04 | Discharge: 2021-05-04 | Disposition: A | Discharge status: Home / Self Care | Payer: BC Managed Care – PPO | Source: Ambulatory Visit | Attending: Women's Health | Admitting: Women's Health

## 2021-05-04 VITALS — BP 103/68 | HR 55 | Ht 65.0 in | Wt 167.5 lb

## 2021-05-04 DIAGNOSIS — Z113 Encounter for screening for infections with a predominantly sexual mode of transmission: Secondary | ICD-10-CM | POA: Diagnosis not present

## 2021-05-04 DIAGNOSIS — Z0000 Encounter for general adult medical examination without abnormal findings: Secondary | ICD-10-CM

## 2021-05-04 DIAGNOSIS — Z01419 Encounter for gynecological examination (general) (routine) without abnormal findings: Secondary | ICD-10-CM

## 2021-05-04 NOTE — Progress Notes (Signed)
? ?WELL-WOMAN EXAMINATION ?Patient name: Lisa Christian MRN [id number]  Date of birth: 13-Sep-1990 ?Chief Complaint:   ?Gynecologic Exam ? ?History of Present Illness:   ?Lisa Christian is a 31 y.o. G19P1001 Caucasian female being seen today for a routine well-woman exam.  ?Current complaints: got married in Oct, thinking about maybe having a baby, had back surgery in 2021 and is concerned about back pain during pregnancy and if would need a c/s. Discussed low back pain is common even w/o h/o back surgery, so likely would have some pain. Typically h/o back surgery does not necessitate c/s.  ?Some vulvar itching occ right before period, then goes away, no odor. ? ?PCP: [facility]      ?does not desire labs ?Patient's last menstrual period was 05/03/2021. ?The current method of family planning is  Phexxi, condoms and w/drawal .  ?Last pap 02/02/18. Results were: NILM w/ HRHPV not done. H/O abnormal pap: yes 2014 LSIL-H ?Last mammogram: never. Results were: N/A. Family h/o breast cancer: yes [name] 'not genetic type' ?Last colonoscopy: never. Results were: N/A. Family h/o colorectal cancer: no ? ? ?  05/04/2021  ? 10:48 AM 02/02/2018  ?  2:46 PM  ?Depression screen PHQ 2/9  ?Decreased Interest 0 0  ?Down, Depressed, Hopeless 0 0  ?PHQ - 2 Score 0 0  ?Altered sleeping 1   ?Tired, decreased energy 0   ?Change in appetite 0   ?Feeling bad or failure about yourself  0   ?Trouble concentrating 0   ?Moving slowly or fidgety/restless 0   ?Suicidal thoughts 0   ?PHQ-9 Score 1   ? ?  ? ?  05/04/2021  ? 10:49 AM  ?GAD 7 : Generalized Anxiety Score  ?Nervous, Anxious, on Edge 0  ?Control/stop worrying 0  ?Worry too much - different things 0  ?Trouble relaxing 0  ?Restless 0  ?Easily annoyed or irritable 0  ?Afraid - awful might happen 0  ?Total GAD 7 Score 0  ? ? ? ?Review of Systems:   ?Pertinent items are noted in HPI ?Denies any headaches, blurred vision, fatigue, shortness of breath, chest pain, abdominal pain, abnormal vaginal  discharge/itching/odor/irritation, problems with periods, bowel movements, urination, or intercourse unless otherwise stated above. ?Pertinent History Reviewed:  ?Reviewed past medical,surgical, social and family history.  ?Reviewed problem list, medications and allergies. ?Physical Assessment:  ? ?Vitals:  ? 05/04/21 1038  ?BP: 103/68  ?Pulse: (!) 55  ?Weight: 167 lb 8 oz (76 kg)  ?Height: 5\' 5"  (1.651 m)  ?Body mass index is 27.87 kg/m?. ?  ?     Physical Examination:  ? General appearance - well appearing, and in no distress ? Mental status - alert, oriented to person, place, and time ? Psych:  She has a normal mood and affect ? Skin - warm and dry, normal color, no suspicious lesions noted ? Chest - effort normal, all lung fields clear to auscultation bilaterally ? Heart - normal rate and regular rhythm ? Neck:  midline trachea, no thyromegaly or nodules ? Breasts - breasts appear normal, no suspicious masses, no skin or nipple changes or  axillary nodes ? Abdomen - soft, nontender, nondistended, no masses or organomegaly ? Pelvic - VULVA: normal appearing vulva with no masses, tenderness or lesions  VAGINA: normal appearing vagina with normal color and discharge, no lesions, menstrual blood  CERVIX: normal appearing cervix without discharge or lesions, no CMT ? Thin prep pap is done w/ HR HPV cotesting ? UTERUS: uterus  is felt to be normal size, shape, consistency and nontender  ? ADNEXA: No adnexal masses or tenderness noted. ? Extremities:  No swelling or varicosities noted ? ?Chaperone: [name]   ? ?No results found for this or any previous visit (from the past 24 hour(s)).  ?Assessment & Plan:  ?1) Well-Woman Exam ? ?2) Cervical cancer screening ? ?3) May want to conceive> when ready start pnv and stop Phexxi/condoms/w/drawal, let us know when gets +HPT. Discussed low back pain is common even w/o h/o back surgery, so likely would have some pain. Typically h/o back surgery does not necessitate c/s. Can  discuss other concerns about h/o back surgery & pregnancy w/ her surgeon ? ?4) STD screen> gc/ct on pap ? ?Labs/procedures today: pap ? ?Mammogram: @ 31yo, or sooner if problems ?Colonoscopy: @ 31yo, or sooner if problems ? ?No orders of the defined types were placed in this encounter. ? ? ?Meds: No orders of the defined types were placed in this encounter. ? ? ?Follow-up: Return in about 1 year (around 05/05/2022) for Physical. ? ?Cheral Marker CNM, WHNP-BC ?05/04/2021 ?11:14 AM  ?

## 2021-05-10 LAB — CYTOLOGY - PAP
Chlamydia: NEGATIVE
Comment: NEGATIVE
Comment: NEGATIVE
Comment: NEGATIVE
Comment: NEGATIVE
Comment: NORMAL
Diagnosis: UNDETERMINED — AB
HPV 16: NEGATIVE
HPV 18 / 45: NEGATIVE
High risk HPV: POSITIVE — AB
Neisseria Gonorrhea: NEGATIVE

## 2021-05-14 ENCOUNTER — Telehealth: Payer: Self-pay

## 2021-05-14 NOTE — Telephone Encounter (Signed)
Received msg that she wants to discuss her lab via mychart or telephone call. ? ?Called pt, two identifiers used. Reviewed pt's pap smear results with Cyril Mourning. Recommended 1 yr repeat pap smear. Pt confirmed understanding, but also wants Joellyn Haff to compare 2014 pap. ?

## 2021-05-17 ENCOUNTER — Encounter: Payer: Self-pay | Admitting: Women's Health

## 2021-05-18 ENCOUNTER — Ambulatory Visit: Payer: Medicaid Other

## 2021-05-18 DIAGNOSIS — R072 Precordial pain: Secondary | ICD-10-CM

## 2021-05-22 ENCOUNTER — Encounter: Payer: Self-pay | Admitting: Nurse Practitioner

## 2021-05-29 ENCOUNTER — Ambulatory Visit: Payer: Medicaid Other | Admitting: Cardiology

## 2021-05-29 ENCOUNTER — Encounter: Payer: Self-pay | Admitting: Cardiology

## 2021-05-29 VITALS — BP 104/72 | HR 67 | Temp 98.0°F | Resp 16 | Ht 65.0 in | Wt 166.0 lb

## 2021-05-29 DIAGNOSIS — R0602 Shortness of breath: Secondary | ICD-10-CM

## 2021-05-29 DIAGNOSIS — R072 Precordial pain: Secondary | ICD-10-CM

## 2021-05-29 DIAGNOSIS — R002 Palpitations: Secondary | ICD-10-CM

## 2021-05-29 NOTE — Progress Notes (Addendum)
Date:  05/29/2021   ID:  Lisa Christian, DOB 1990/02/01, MRN [id number]  PCP:  [facility] [place]  Cardiologist:  Rex Kras, DO, [facility] (established care 04/17/2021)  Date: 05/29/21 Last Office Visit: 04/17/2021  Chief Complaint  Patient presents with   Follow-up    Reevaluation of chest pain, palpitations, discuss test results    HPI  Lisa Christian is a 31 y.o. Caucasian female who does not have any significant past cardiac history.  She was referred to the practice for evaluation of palpitations and review of systems also positive for precordial discomfort.  Given her symptoms of palpitations she underwent an extended Holter monitor which noted underlying rhythm to be sinus without any significant dysrhythmias.  22 patient triggered events noted no significant dysrhythmias and underlying rhythm was sinus.  Her symptoms of precordial discomfort were noncardiac.  She underwent an echocardiogram and GXT since last office visit.  Results reviewed with her in great detail and noted below for further reference.  Clinically patient is doing well with nonspecific precordial discomfort and shortness of breath which appears to be noncardiac overall.  FUNCTIONAL STATUS: No structured exercise program or daily routine.   ALLERGIES: Allergies  Allergen Reactions   Latex     Vaginal irritation    MEDICATION LIST PRIOR TO VISIT: Current Meds  Medication Sig   Cholecalciferol (VITAMIN D3) 10 MCG/ML LIQD Take 2 drops by mouth daily.   Lactic Ac-Citric Ac-Pot Bitart (PHEXXI) 1.8-1-0.4 % GEL Insert 1 applicatorful vaginally up to an hour before sex     PAST MEDICAL HISTORY: Past Medical History:  Diagnosis Date   Anxiety    Family history of adverse reaction to anesthesia    " not sure why second cousin can't get put to sleep."   Gallbladder polyp    GERD (gastroesophageal reflux disease)    Headache    HNP (herniated nucleus pulposus), lumbar    Panic attacks     Pseudotumor cerebri    Vitamin D deficiency     PAST SURGICAL HISTORY: Past Surgical History:  Procedure Laterality Date   BIOPSY N/A 01/11/2014   Procedure: BIOPSY;  Surgeon: Danie Binder, MD;  Location: [facility];  Service: Endoscopy;  Laterality: N/A;  duodenal and gastric   ESOPHAGOGASTRODUODENOSCOPY (EGD) WITH PROPOFOL N/A 01/11/2014   [id number] non-erosive   LUMBAR LAMINECTOMY/DECOMPRESSION MICRODISCECTOMY Left 03/01/2019   Procedure: Microdiscectomy - Lumbar Four-Lumbar Five - left;  Surgeon: Kary Kos, MD;  Location: [place];  Service: Neurosurgery;  Laterality: Left;  Microdiscectomy - Lumbar Four-Lumbar Five - left   WISDOM TOOTH EXTRACTION      FAMILY HISTORY: The patient family history includes Anxiety disorder in her mother; Cancer in her maternal grandmother; Colon cancer in an other family member; Heart disease in her maternal grandfather; Heart murmur in her maternal grandmother.  SOCIAL HISTORY:  The patient  reports that she has never smoked. She has never used smokeless tobacco. She reports current alcohol use. She reports that she does not use drugs.  REVIEW OF SYSTEMS: Review of Systems  Cardiovascular:  Negative for chest pain, claudication, dyspnea on exertion, leg swelling, near-syncope, orthopnea, palpitations, paroxysmal nocturnal dyspnea and syncope.  Respiratory:  Negative for shortness of breath.    PHYSICAL EXAM:    05/29/2021    9:57 AM 05/04/2021   10:38 AM 04/17/2021    2:47 PM  Vitals with BMI  Height 5\' 5"  5\' 5"  5\' 5"   Weight 166 lbs 167 lbs 8 oz  172 lbs  BMI 27.62 0000000 0000000  Systolic [id number]  Diastolic 72 68 69  Pulse 67 55 63    CONSTITUTIONAL: Well-developed and well-nourished. No acute distress.  SKIN: Skin is warm and dry. No rash noted. No cyanosis. No pallor. No jaundice HEAD: Normocephalic and atraumatic.  EYES: No scleral icterus MOUTH/THROAT: Moist oral membranes.  NECK: No JVD present. No thyromegaly noted. No carotid  bruits  LYMPHATIC: No visible cervical adenopathy.  CHEST Normal respiratory effort. No intercostal retractions  LUNGS: Clear to auscultation bilaterally.  No stridor. No wheezes. No rales.  CARDIOVASCULAR: Regular rate and rhythm, positive S1-S2, no murmurs rubs or gallops appreciated. ABDOMINAL: Soft, nontender, distended, positive bowel sounds in all 4 quadrants, no apparent ascites.  EXTREMITIES: No peripheral edema, warm to touch, 2+ bilateral DP and PT pulses HEMATOLOGIC: No significant bruising NEUROLOGIC: Oriented to person, place, and time. Nonfocal. Normal muscle tone.  PSYCHIATRIC: Normal mood and affect. Normal behavior. Cooperative  CARDIAC DATABASE: EKG: 04/09/2021: Normal sinus rhythm, 62 bpm, without underlying ischemia or injury pattern.  Cardiac monitor (Zio Patch): Patch Wear Time:  4 days and 21 hours  Dominant rhythm normal sinus rhythm. Heart rate 40-144 bpm.  Avg HR 71 bpm. No atrial fibrillation, supraventricular tachycardia, ventricular tachycardia, high grade AV block, pauses (3 seconds or longer). Total ventricular ectopic burden <1%. Total supraventricular ectopic burden <1%. Patient triggered events: 22.  Underlying rhythm sinus without dysrhythmias.  These did not correlate with any significant dysrhythmias.  Echocardiogram: 04/26/2021:  Normal LV systolic function with visual EF 60-65%. Left ventricle cavity is normal in size. Normal left ventricular wall thickness. Normal global wall motion. Normal diastolic filling pattern, normal LAP. Calculated EF 60%.  Mild (Grade I) mitral regurgitation.  Mild pulmonic regurgitation.  No prior study for comparison.   Stress Testing: Exercise treadmill stress test 05/18/2021: Exercise treadmill stress test performed using Bruce protocol.  Patient reached 10.1 METS, and 90% of age predicted maximum heart rate.  Exercise capacity was excellent.  No chest pain reported.  Normal heart rate and hemodynamic response.  Stress EKG revealed no ischemic changes. Low risk study.  Heart Catheterization: None  LABORATORY DATA:    Latest Ref Rng & Units 05/02/2020    8:44 PM 03/01/2019    6:37 AM 02/02/2018    3:31 PM  CBC  WBC 4.0 - 10.5 K/uL 14.0   12.3   8.2    Hemoglobin 12.0 - 15.0 g/dL 13.0   13.2   13.2    Hematocrit 36.0 - 46.0 % 38.6   39.2   38.5    Platelets 150 - 400 K/uL 260   328   301         Latest Ref Rng & Units 05/02/2020    8:44 PM 03/01/2019    6:37 AM 02/02/2018    3:31 PM  CMP  Glucose 70 - 99 mg/dL 104   99   102    BUN 6 - 20 mg/dL 5   9   6     Creatinine 0.44 - 1.00 mg/dL 0.69   0.56   0.70    Sodium 135 - 145 mmol/L 136   139   138    Potassium 3.5 - 5.1 mmol/L 3.8   3.8   4.1    Chloride 98 - 111 mmol/L 106   103   102    CO2 22 - 32 mmol/L 26   25   20     Calcium 8.9 -  10.3 mg/dL 8.9   9.3   9.0    Total Protein 6.0 - 8.5 g/dL   7.0    Total Bilirubin 0.0 - 1.2 mg/dL   0.4    Alkaline Phos 39 - 117 IU/L   63    AST 0 - 40 IU/L   18    ALT 0 - 32 IU/L   17      Lipid Panel  No results found for: CHOL, TRIG, HDL, CHOLHDL, VLDL, LDLCALC, LDLDIRECT, LABVLDL  No components found for: NTPROBNP No results for input(s): PROBNP in the last 8760 hours. No results for input(s): TSH in the last 8760 hours.  BMP No results for input(s): NA, K, CL, CO2, GLUCOSE, BUN, CREATININE, CALCIUM, GFRNONAA, GFRAA in the last 8760 hours.   HEMOGLOBIN A1C No results found for: HGBA1C, MPG  IMPRESSION:    ICD-10-CM   1. Palpitations  R00.2     2. Precordial pain  R07.2     3. Shortness of breath  R06.02        RECOMMENDATIONS: SHTERNA FULTZ is a 31 y.o. Caucasian female without past cardiac history presenting to the office for evaluation of palpitations and precordial discomfort/shortness of breath.  She is undergone appropriate work-up including extended Holter monitor, echocardiogram, and exercise treadmill stress test.  Extended Holter monitor reported no significant  dysrhythmias.  Echocardiogram notes preserved LVEF with very mild valvular heart disease.  And GXT was reported to be low risk.  I have asked her to discuss noncardiac causes of precordial discomfort and shortness of breath with her PCP.  Given the work-up is essentially unremarkable recommended as needed follow-up.  However, patient is concerned with the incidental finding of mitral regurgitation which is overall mild and no additional work-up is warranted at this time.  The shared decision was to follow-up in 3 years to reevaluate her underlying MR or sooner if change in clinical status.    FINAL MEDICATION LIST END OF ENCOUNTER: No orders of the defined types were placed in this encounter.   Medications Discontinued During This Encounter  Medication Reason   acetaminophen (TYLENOL) 500 MG tablet    penicillin v potassium (VEETID) 500 MG tablet      Current Outpatient Medications:    Cholecalciferol (VITAMIN D3) 10 MCG/ML LIQD, Take 2 drops by mouth daily., Disp: , Rfl:    Lactic Ac-Citric Ac-Pot Bitart (PHEXXI) 1.8-1-0.4 % GEL, Insert 1 applicatorful vaginally up to an hour before sex, Disp: 5 g, Rfl: 11  No orders of the defined types were placed in this encounter.   There are no Patient Instructions on file for this visit.   --Continue cardiac medications as reconciled in final medication list. --Return in about 3 years (around 05/29/2024) for follow up mitral regurgiation. . Or sooner if needed. --Continue follow-up with your primary care physician regarding the management of your other chronic comorbid conditions.  Patient's questions and concerns were addressed to her satisfaction. She voices understanding of the instructions provided during this encounter.   This note was created using a voice recognition software as a result there may be grammatical errors inadvertently enclosed that do not reflect the nature of this encounter. Every attempt is made to correct such errors.  Time  spent: 20 minutes.  Rex Kras, [place], [facility]  Pager: [phone] Office: [phone]  ADDENDUM: External Labs: Collected: 04/11/2021 provided by primary team. TSH 2.33. Hemoglobin 12.8 g/dL, hematocrit 37.6%. BUN 10, creatinine 0.76. Sodium 140, potassium 4.1, chloride 104, bicarb  25. AST 11, ALT 9, alkaline phosphatase [address] [facility] B[facility]  Pager: [phone] Office: [phone] 08/13/21 12:04 AM

## 2021-06-05 ENCOUNTER — Encounter: Payer: Medicaid Other | Admitting: Women's Health

## 2021-06-05 ENCOUNTER — Encounter: Payer: Self-pay | Admitting: Women's Health

## 2021-06-05 ENCOUNTER — Other Ambulatory Visit: Payer: Self-pay | Admitting: Women's Health

## 2021-06-05 MED ORDER — LORAZEPAM 1 MG PO TABS: ORAL_TABLET | ORAL | 0 refills | Status: DC

## 2021-06-07 ENCOUNTER — Other Ambulatory Visit: Payer: Self-pay | Admitting: Nurse Practitioner

## 2021-06-07 ENCOUNTER — Encounter: Payer: Self-pay | Admitting: Nurse Practitioner

## 2021-06-07 ENCOUNTER — Ambulatory Visit: Payer: BC Managed Care – PPO | Admitting: Nurse Practitioner

## 2021-06-07 ENCOUNTER — Other Ambulatory Visit (INDEPENDENT_AMBULATORY_CARE_PROVIDER_SITE_OTHER): Payer: BC Managed Care – PPO

## 2021-06-07 VITALS — BP 100/62 | HR 66 | Ht 64.0 in | Wt 167.1 lb

## 2021-06-07 DIAGNOSIS — R1013 Epigastric pain: Secondary | ICD-10-CM

## 2021-06-07 DIAGNOSIS — R1012 Left upper quadrant pain: Secondary | ICD-10-CM

## 2021-06-07 LAB — HEPATIC FUNCTION PANEL
ALT: 13 U/L (ref 0–35)
AST: 13 U/L (ref 0–37)
Albumin: 4.6 g/dL (ref 3.5–5.2)
Alkaline Phosphatase: 43 U/L (ref 39–117)
Bilirubin, Direct: 0.1 mg/dL (ref 0.0–0.3)
Total Bilirubin: 0.5 mg/dL (ref 0.2–1.2)
Total Protein: 7.3 g/dL (ref 6.0–8.3)

## 2021-06-07 LAB — CBC WITH DIFFERENTIAL/PLATELET
Basophils Absolute: 0.1 10*3/uL (ref 0.0–0.1)
Basophils Relative: 1.1 % (ref 0.0–3.0)
Eosinophils Absolute: 0.1 10*3/uL (ref 0.0–0.7)
Eosinophils Relative: 2.3 % (ref 0.0–5.0)
HCT: 38.5 % (ref 36.0–46.0)
Hemoglobin: 13.2 g/dL (ref 12.0–15.0)
Lymphocytes Relative: 34.9 % (ref 12.0–46.0)
Lymphs Abs: 2.1 10*3/uL (ref 0.7–4.0)
MCHC: 34.3 g/dL (ref 30.0–36.0)
MCV: 93.3 fl (ref 78.0–100.0)
Monocytes Absolute: 0.4 10*3/uL (ref 0.1–1.0)
Monocytes Relative: 6.6 % (ref 3.0–12.0)
Neutro Abs: 3.3 10*3/uL (ref 1.4–7.7)
Neutrophils Relative %: 55.1 % (ref 43.0–77.0)
Platelets: 207 10*3/uL (ref 150.0–400.0)
RBC: 4.13 Mil/uL (ref 3.87–5.11)
RDW: 12.3 % (ref 11.5–15.5)
WBC: 6 10*3/uL (ref 4.0–10.5)

## 2021-06-07 LAB — LIPID PANEL
Cholesterol: 150 mg/dL (ref 0–200)
HDL: 37.2 mg/dL — ABNORMAL LOW (ref >39.00)
LDL Cholesterol: 92 mg/dL (ref 0–99)
NonHDL: 112.33
Total CHOL/HDL Ratio: 4
Triglycerides: 101 mg/dL (ref 0.0–149.0)
VLDL: 20.2 mg/dL (ref 0.0–40.0)

## 2021-06-07 LAB — LIPASE: Lipase: 26 U/L (ref 11.0–59.0)

## 2021-06-07 NOTE — Progress Notes (Signed)
Assessment   Patient profile:  Lisa Christian is a 31 y.o. female new to practice, referred by PCP for abdominal pain.  Past medical history significant for anxiety, headaches, GERD, laminectomy of L4, L5, S1.  See PMH below for any additional history  31 yo female referred for epigastric pain radiating through to back at times. Pain often postprandial. PUD? Biliary dyskinesia? Functional pain? Recent negative cardiac evaluation for chest pain / palpitations  Symptoms started about three months ago. [place] suggest 5 mm gallbladder polyp but otherwise normal.  Recent leukocytosis of unclear etiology. WBC 14.   Abnormal PAP on 05/04/21 . Atypical squamous cells of undetermined significance. Getting scheduled for a colposcopy.    Plan   Repeat CBC today. Obtain LFTs Schedule for EGD. The risks and benefits of EGD with possible biopsies were discussed with the patient who agrees to proceed.  After patient left clinic I realized that she doesn't have Omeprazole or any PPI on home med list even though ED in [place] started her on it in March. Will call her tomorrow to clarify if she has been on a PPI.    History of Present Illness   Chief complaint:  upper abdominal pain   Referred by PCP for abdominal pain. No records available.   In mid March Suany was seen in ED in [place] for chest pain. Care Everywhere doesn't show the complete record but appears they thought her symptoms were reflux related, started Omeprazole. CXR was negative. No other details can be gleaned from the record. She describes feeling like air ( when belching) is unable to move up and out of esophagus.   Separately, three months ago Dalli began having epigastric pain radiating through to her back, usually after eating. Occasionally the pain radiates into her right shoulder. On a couple of occasions she has had associated SOB and mild nausea. Additionally she sometimes has LUQ pain radiating around her side. She has a remote  history of acid reflux but no significant symptoms at present. Doesn't consume much Etoh. Doesn't take much NSAIDS. Saw Cardiology 04/17/21 for substernal chest pain and palpitations. Cardiac workup negative.   There was some concern for gallbladder disease.  Got a RUQ [place] on 04/30/21 >> possibly a 5 mm gallbladder polyp. No stones or signs of acute cholecystitis.   Labs on 4/12 showed WBC was 14. Hgb 13. No recent LFTs drawn unless PCP did them.   Tyasia gives a history of hemorrhoids which sometimes bleed when constipated. Blood is bright red and associated with anorectal discomfort. Takes Dulcolax as needed. Lately she has been eating better and drinking more water with improvement in constipation.  No [place] of colon cancer in primary relative.    Previous Labs / Imaging::    Latest Ref Rng & Units 05/02/2020    8:44 PM 03/01/2019    6:37 AM 02/02/2018    3:31 PM  CBC  WBC 4.0 - 10.5 K/uL 14.0   12.3   8.2    Hemoglobin 12.0 - 15.0 g/dL 13.0   13.2   13.2    Hematocrit 36.0 - 46.0 % 38.6   39.2   38.5    Platelets 150 - 400 K/uL 260   328   301      No results found for: LIPASE    Latest Ref Rng & Units 05/02/2020    8:44 PM 03/01/2019    6:37 AM 02/02/2018    3:31 PM  CMP  Glucose 70 -  99 mg/dL 104   99   102    BUN 6 - 20 mg/dL 5   9   6     Creatinine 0.44 - 1.00 mg/dL 0.69   0.56   0.70    Sodium 135 - 145 mmol/L 136   139   138    Potassium 3.5 - 5.1 mmol/L 3.8   3.8   4.1    Chloride 98 - 111 mmol/L 106   103   102    CO2 22 - 32 mmol/L 26   25   20     Calcium 8.9 - 10.3 mg/dL 8.9   9.3   9.0    Total Protein 6.0 - 8.5 g/dL   7.0    Total Bilirubin 0.0 - 1.2 mg/dL   0.4    Alkaline Phos 39 - 117 IU/L   63    AST 0 - 40 IU/L   18    ALT 0 - 32 IU/L   17      Imaging:   Echocardiogram: 04/26/2021:  Normal LV systolic function with visual EF 60-65%. Left ventricle cavity is normal in size. Normal left ventricular wall thickness. Normal global wall motion. Normal diastolic  filling pattern, normal LAP. Calculated EF 60%.  Mild (Grade I) mitral regurgitation.  Mild pulmonic regurgitation.  No prior study for comparison  04/30/21 RUQ US IMPRESSION: 5 mm gallbladder polyp is seen. There are no signs of acute cholecystitis or significant dilation of bile ducts.    Past Medical History:  Diagnosis Date   Anxiety    Family history of adverse reaction to anesthesia    " not sure why second cousin can't get put to sleep."   Gallbladder polyp    GERD (gastroesophageal reflux disease)    Headache    HNP (herniated nucleus pulposus), lumbar    Panic attacks    Pseudotumor cerebri    Vitamin D deficiency    Past Surgical History:  Procedure Laterality Date   BIOPSY N/A 01/11/2014   Procedure: BIOPSY;  Surgeon: Danie Binder, MD;  Location: [facility];  Service: Endoscopy;  Laterality: N/A;  duodenal and gastric   ESOPHAGOGASTRODUODENOSCOPY (EGD) WITH PROPOFOL N/A 01/11/2014   [id number] non-erosive   LUMBAR LAMINECTOMY/DECOMPRESSION MICRODISCECTOMY Left 03/01/2019   Procedure: Microdiscectomy - Lumbar Four-Lumbar Five - left;  Surgeon: Kary Kos, MD;  Location: [place];  Service: Neurosurgery;  Laterality: Left;  Microdiscectomy - Lumbar Four-Lumbar Five - left   WISDOM TOOTH EXTRACTION     Family History  Problem Relation Age of Onset   Anxiety disorder Mother    Gallbladder disease Mother    Breast cancer Maternal Grandmother    Heart murmur Maternal Grandmother    Heart disease Maternal Grandfather    Heart disease Paternal Grandmother    Stroke Paternal Grandfather    Pancreatic cancer Maternal Uncle    Colon cancer Maternal Great-grandmother    Social History   Tobacco Use   Smoking status: Former    Types: Cigarettes    Passive exposure: Past   Smokeless tobacco: Never   Tobacco comments:    Some day smoker when having a drink once or twice a year for about 5 years  Vaping Use   Vaping Use: Never used  Substance Use Topics   Alcohol  use: Yes    Comment: very rarely   Drug use: No   Current Outpatient Medications  Medication Sig Dispense Refill   Cholecalciferol (VITAMIN D3) 10 MCG/ML LIQD  Take 2 drops by mouth daily.     Lactic Ac-Citric Ac-Pot Bitart (PHEXXI) 1.8-1-0.4 % GEL Insert 1 applicatorful vaginally up to an hour before sex 5 g 11   LORazepam (ATIVAN) 1 MG tablet Take 1 tablet at least 1 hour before appointment. Have someone drive you to appointment and home. (Patient not taking: Reported on 06/07/2021) 1 tablet 0   No current facility-administered medications for this visit.   Allergies  Allergen Reactions   Latex     Vaginal irritation     Review of Systems: Positive for anxiety, back pain, fatigue and shortness of breath.  All other systems reviewed and negative except where noted in HPI.   Physical Exam   Wt Readings from Last 3 Encounters:  06/07/21 167 lb 2 oz (75.8 kg)  05/29/21 166 lb (75.3 kg)  05/04/21 167 lb 8 oz (76 kg)    Pulse 66   Ht 5\' 4"  (1.626 m) Comment: height measured without shoes  Wt 167 lb 2 oz (75.8 kg)   LMP 05/24/2021   BMI 28.69 kg/m  Constitutional:  Generally well appearing female in no acute distress. Psychiatric: Pleasant. Normal mood and affect. Behavior is normal. EENT: Pupils normal.  Conjunctivae are normal. No scleral icterus. Neck supple.  Cardiovascular: Normal rate, regular rhythm. No edema Pulmonary/chest: Effort normal and breath sounds normal. No wheezing, rales or rhonchi. Abdominal: Soft, nondistended, nontender. Bowel sounds active throughout. There are no masses palpable. No hepatomegaly. Neurological: Alert and oriented to person place and time. Skin: Skin is warm and dry. No rashes noted.  Tye Savoy, NP  06/07/2021, 11:05 AM  Cc:  Referring Provider Kathryne Hitch, NP

## 2021-06-07 NOTE — Progress Notes (Signed)
Agree with the assessment and plan as outlined by Willette Cluster, NP.  Would also recommend repeat ultrasound in 1 year to document stability of gallbladder polyp.  Lisa Tetrault E. Tomasa Rand, MD [facility]

## 2021-06-07 NOTE — Patient Instructions (Addendum)
Your provider has requested that you go to the basement level for lab work before leaving today. Press "B" on the elevator. The lab is located at the first door on the left as you exit the elevator.  Due to recent changes in healthcare laws, you may see the results of your imaging and laboratory studies on MyChart before your provider has had a chance to review them.  We understand that in some cases there may be results that are confusing or concerning to you. Not all laboratory results come back in the same time frame and the provider may be waiting for multiple results in order to interpret others.  Please give [place] 48 hours in order for your provider to thoroughly review all the results before contacting the office for clarification of your results.   You have been scheduled for an endoscopy. Please follow written instructions given to you at your visit today. If you use inhalers (even only as needed), please bring them with you on the day of your procedure.  I appreciate the opportunity to care for you. Tye Savoy, NP-C

## 2021-06-11 ENCOUNTER — Telehealth: Payer: Self-pay

## 2021-06-11 NOTE — Telephone Encounter (Signed)
Left message for pt to call back. Reminder placed for repeat US Abdomen Limited RUQ (LIVER/GB) in one year.

## 2021-06-11 NOTE — Telephone Encounter (Signed)
-----   Message from Willia Craze, NP sent at 06/08/2021  4:25 PM EDT ----- Mickel Baas, will you please let her know that we would like to repeat the [place] in a year to make sure gallbladder polyps hasn't grown. Please put her on a recall list for imaging. Thanks ----- Message ----- From: Daryel November, MD Sent: 06/07/2021   4:53 PM EDT To: Willia Craze, NP     ----- Message ----- From: Willia Craze, NP Sent: 06/07/2021   4:37 PM EDT To: Daryel November, MD

## 2021-06-11 NOTE — Telephone Encounter (Signed)
Spoke with pt and let her know recommendations. Pt verbalized understanding and had no other concerns at end of call.  

## 2021-06-13 ENCOUNTER — Other Ambulatory Visit: Payer: Self-pay

## 2021-06-13 MED ORDER — OMEPRAZOLE 20 MG PO CPDR: 20.0000 mg | DELAYED_RELEASE_CAPSULE | Freq: Every day | ORAL | 0 refills | Status: DC

## 2021-06-13 NOTE — Telephone Encounter (Signed)
Spoke with pt and gave pt recommendations. Omeprazole sent to pharmacy. Pt verbalized understanding and had no other concern at end of call.

## 2021-06-25 ENCOUNTER — Other Ambulatory Visit: Payer: Self-pay | Admitting: Women's Health

## 2021-06-25 IMAGING — MR MR LUMBAR SPINE W/O CM
4 of 5 series · 27 of 48 positions shown · non-contrast
Comparison: None.

CLINICAL DATA: Progressive low back pain for for over 6 weeks with
numbness

EXAM:
MRI LUMBAR SPINE WITHOUT CONTRAST
TECHNIQUE: Multiplanar, multisequence MR imaging of the lumbar spine was
performed. No intravenous contrast was administered.

[Series 5: T2 · sagittal · 4.0mm · 0.73mm/px · 6 of 14 slices shown (1 of 2)]
[im 1/14]
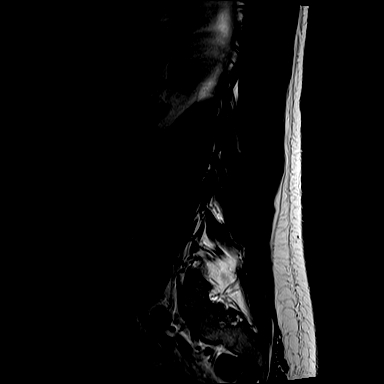
[im 3/14]
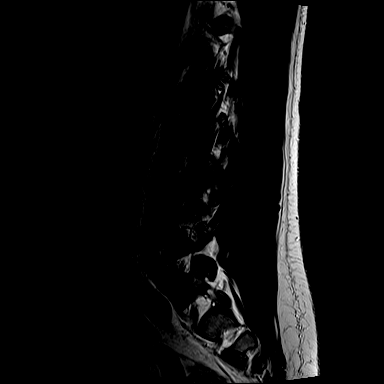
[im 6/14]
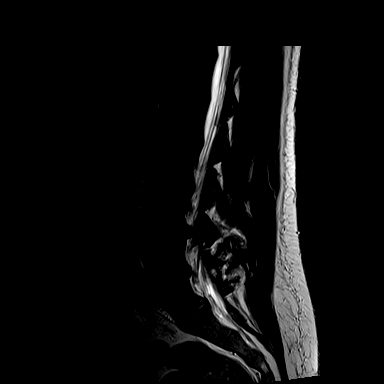
[im 8/14]
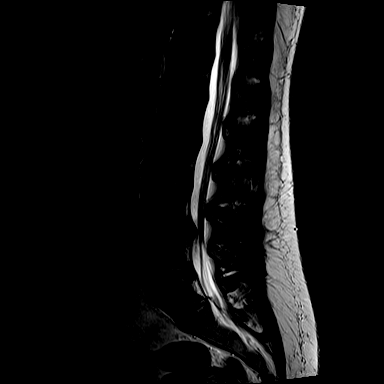
[im 11/14]
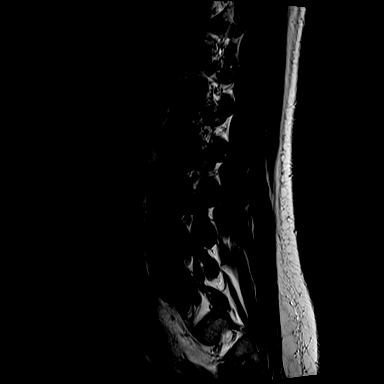
[im 14/14]
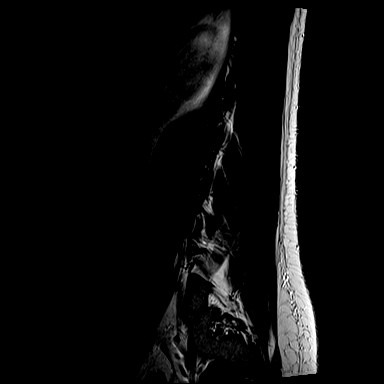

[Series 7: T1 · sagittal · 4.0mm · 0.88mm/px · 7 of 14 slices shown (1 of 2)]
[im 1/14]
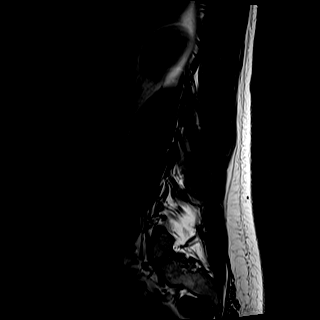
[im 3/14]
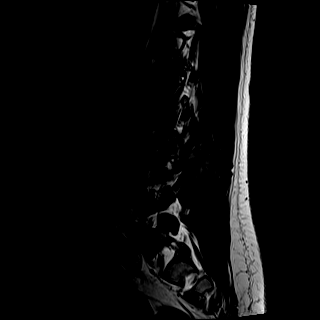
[im 5/14]
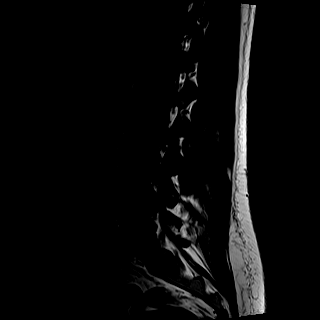
[im 7/14]
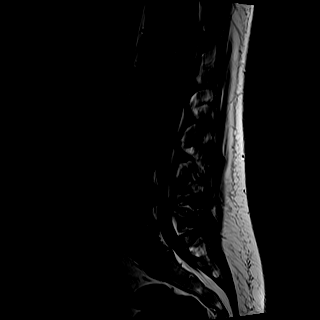
[im 9/14]
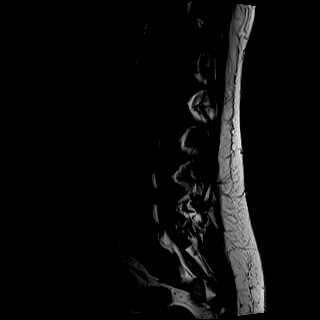
[im 11/14]
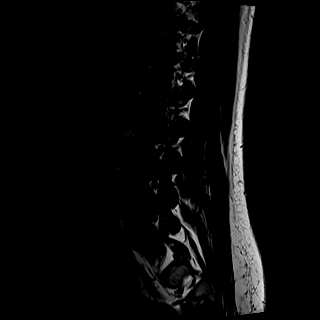
[im 14/14]
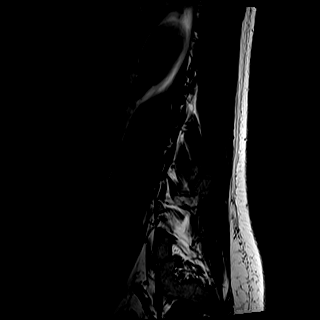

[Series 8: T2 · axial · 4.0mm · 0.57mm/px · z∈[-112,+72]mm · 8 of 30 slices shown (2 of 2)]
[im 1/30]
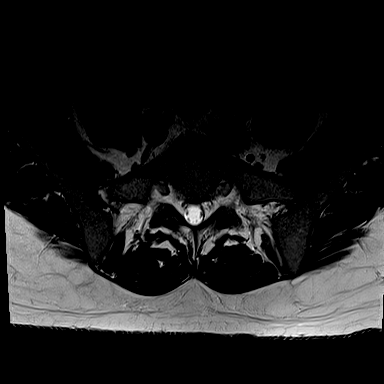
[im 5/30]
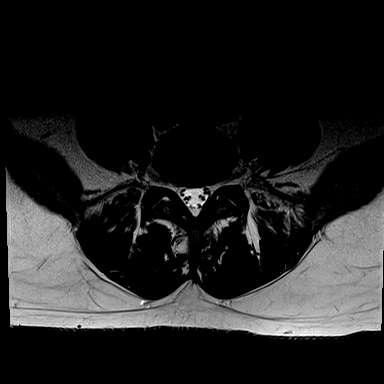
[im 9/30]
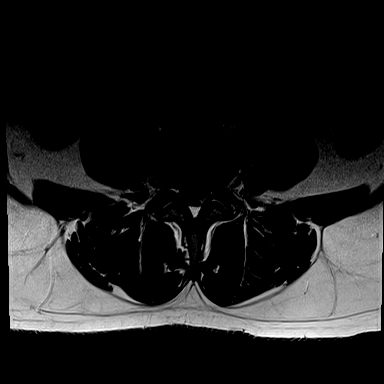
[im 14/30]
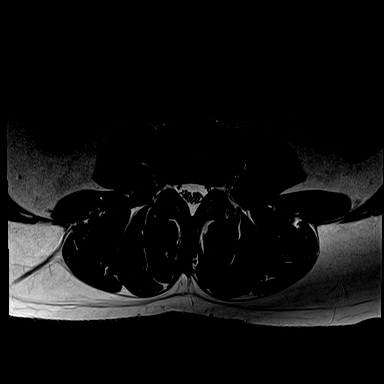
[im 16/30]
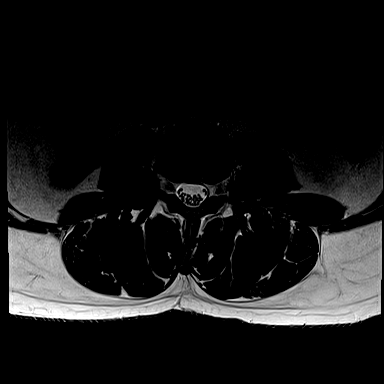
[im 21/30]
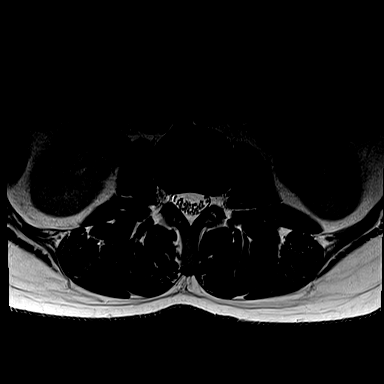
[im 25/30]
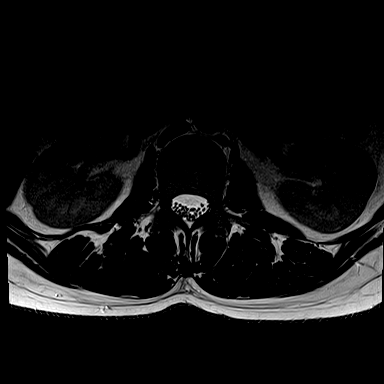
[im 30/30]
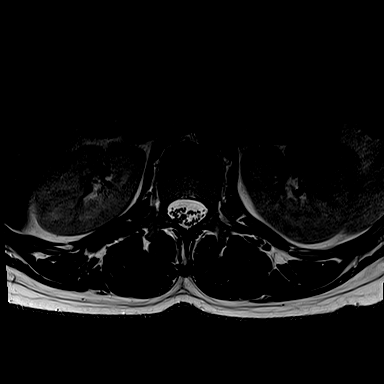

[Series 9: T1 · axial · 4.0mm · 0.34mm/px · z∈[-112,+47]mm · 6 of 30 slices shown (2 of 2)]
[im 1/30]
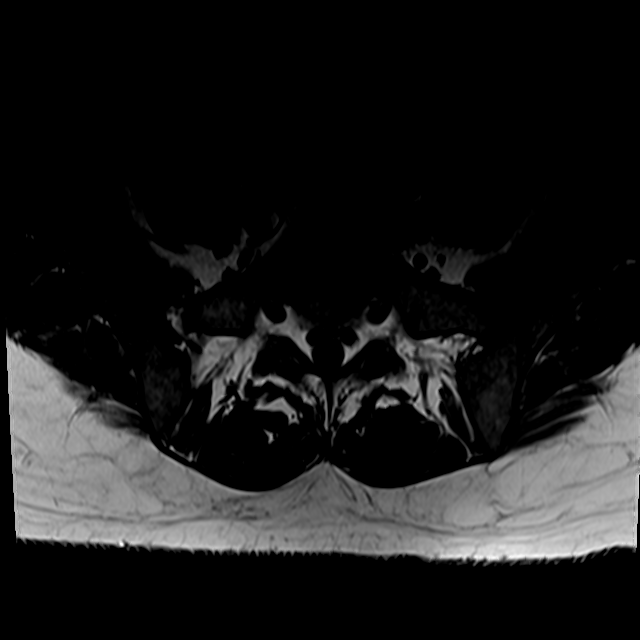
[im 5/30]
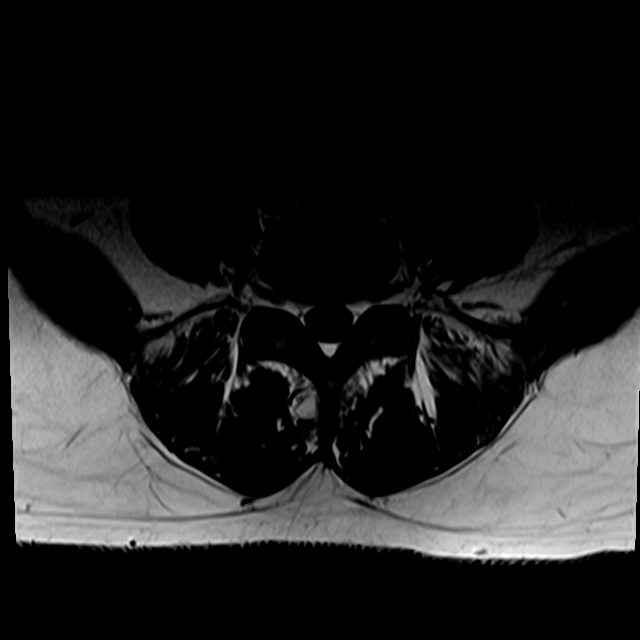
[im 9/30]
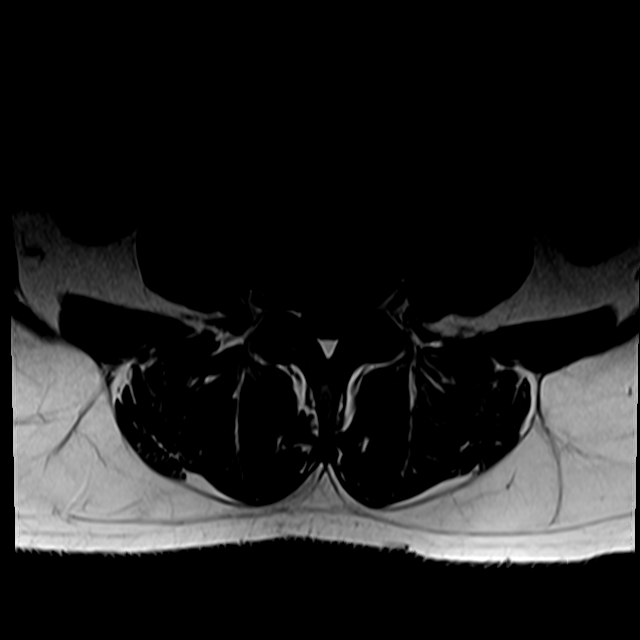
[im 14/30]
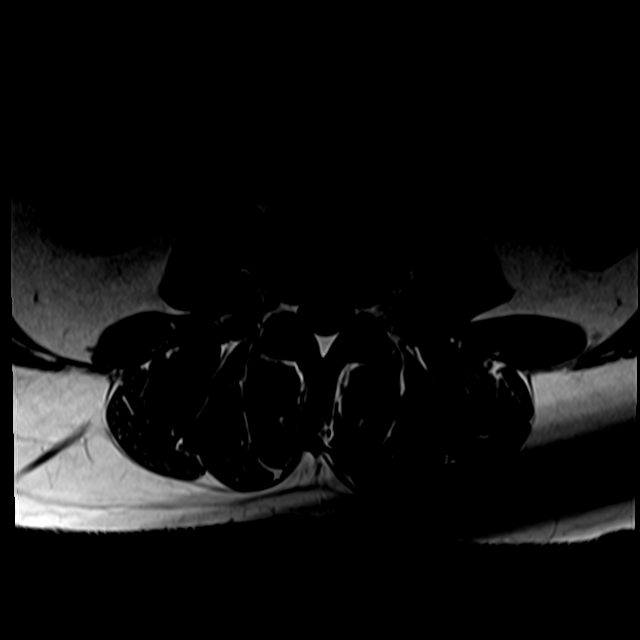
[im 16/30]
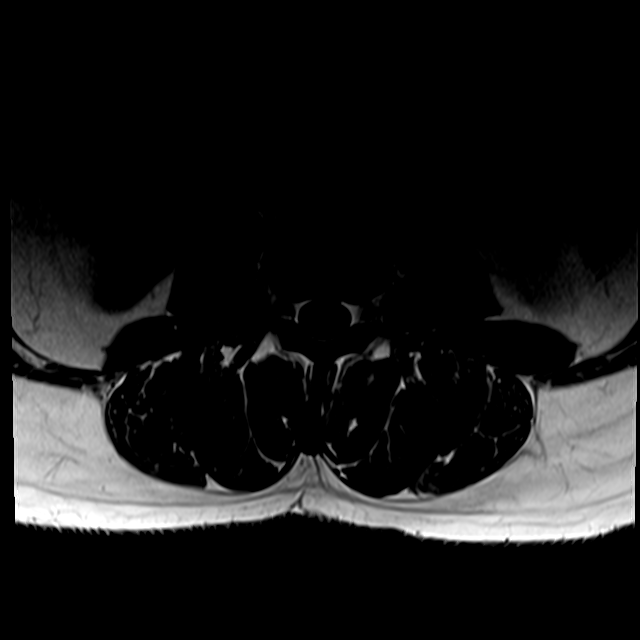
[im 25/30]
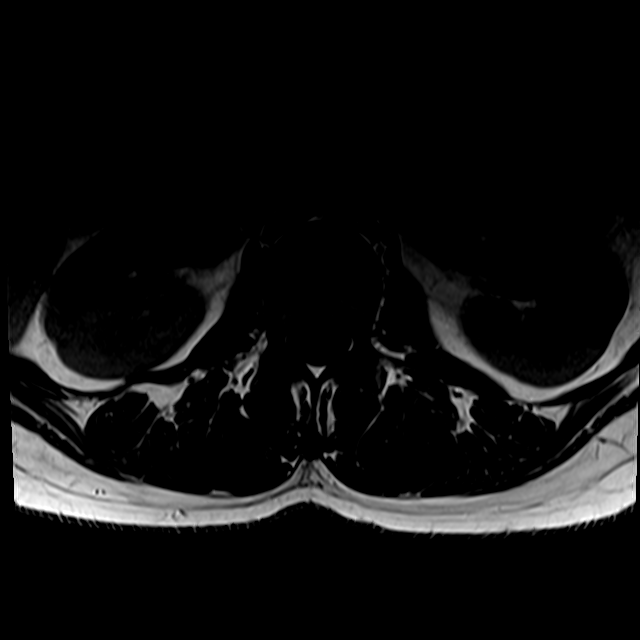

[27 of 48 positions shown; findings below may reference images not displayed]

FINDINGS: Segmentation: Transitional S1 vertebra with rudimentary S1-2 disc
space.

Alignment:  Normal

Vertebrae:  No fracture, evidence of discitis, or bone lesion.

Conus medullaris and cauda equina: Conus extends to the L1 level.
Conus and cauda equina appear normal.

Paraspinal and other soft tissues: Negative

Disc levels:

T12- L1: Unremarkable.

L1-L2: Unremarkable.

L2-L3: Unremarkable.

L3-L4: Unremarkable.

L4-L5: Disc narrowing and desiccation with left eccentric and
inferior pointing disc extrusion causing high-grade spinal stenosis
and asymmetric compression of the descending left L5 nerve root. The
foramina are patent.

L5-S1:Disc narrowing and mild endplate degeneration with
desiccation. Right eccentric protrusion which contacts the
descending S1 nerve root. The foramina are patent.
IMPRESSION: 1. L4-5 left eccentric extrusion with high-grade/compressive spinal
stenosis.
2. L5-S1 right eccentric protrusion contacting the right S1 nerve
root.

## 2021-06-25 MED ORDER — LORAZEPAM 1 MG PO TABS: ORAL_TABLET | ORAL | 0 refills | Status: DC

## 2021-06-26 ENCOUNTER — Encounter: Payer: BC Managed Care – PPO | Admitting: Obstetrics & Gynecology

## 2021-07-03 ENCOUNTER — Encounter: Payer: BC Managed Care – PPO | Admitting: Women's Health

## 2021-07-05 ENCOUNTER — Encounter: Payer: Medicaid Other | Admitting: Gastroenterology

## 2021-08-01 ENCOUNTER — Encounter: Payer: Self-pay | Admitting: Obstetrics & Gynecology

## 2021-08-01 ENCOUNTER — Other Ambulatory Visit (HOSPITAL_COMMUNITY)
Admission: RE | Admit: 2021-08-01 | Discharge: 2021-08-01 | Disposition: A | Discharge status: Home / Self Care | Payer: BC Managed Care – PPO | Source: Ambulatory Visit | Attending: Women's Health | Admitting: Women's Health

## 2021-08-01 ENCOUNTER — Ambulatory Visit (INDEPENDENT_AMBULATORY_CARE_PROVIDER_SITE_OTHER): Payer: BC Managed Care – PPO | Admitting: Obstetrics & Gynecology

## 2021-08-01 VITALS — BP 121/70 | HR 79 | Ht 65.0 in | Wt 171.4 lb

## 2021-08-01 DIAGNOSIS — Z3009 Encounter for other general counseling and advice on contraception: Secondary | ICD-10-CM | POA: Diagnosis not present

## 2021-08-01 DIAGNOSIS — Z3202 Encounter for pregnancy test, result negative: Secondary | ICD-10-CM

## 2021-08-01 DIAGNOSIS — R87810 Cervical high risk human papillomavirus (HPV) DNA test positive: Secondary | ICD-10-CM

## 2021-08-01 DIAGNOSIS — R87610 Atypical squamous cells of undetermined significance on cytologic smear of cervix (ASC-US): Secondary | ICD-10-CM | POA: Diagnosis not present

## 2021-08-01 LAB — POCT URINE PREGNANCY: Preg Test, Ur: NEGATIVE

## 2021-08-01 NOTE — Progress Notes (Signed)
Patient name: Lisa Christian MRN [id number]  Date of birth: 1990-05-04 Chief Complaint:   Colposcopy  History of Present Illness:   Lisa Christian is a 31 y.o. G83P1001 female being seen today for cervical dysplasia management.  Recent pap 05/04/21: ASCUS, HPV positive  Smoker:  No. New sexual partner:  No.   History of abnormal Pap: yes back in 2014, prior pap 2016 and 2020 negative  Menses mostly regular typically around the first of the month. Contraception: condoms and on occasion Phexxi, but noted yeast infections She may consider a pregnancy in the future, but has concerns regarding back pain/labor.  Pt reports lumbar surgery and is concerned about how a pregnancy may impact her back.   Patient's last menstrual period was 07/21/2021.     05/04/2021   10:48 AM 02/02/2018    2:46 PM  Depression screen PHQ 2/9  Decreased Interest 0 0  Down, Depressed, Hopeless 0 0  PHQ - 2 Score 0 0  Altered sleeping 1   Tired, decreased energy 0   Change in appetite 0   Feeling bad or failure about yourself  0   Trouble concentrating 0   Moving slowly or fidgety/restless 0   Suicidal thoughts 0   PHQ-9 Score 1      Review of Systems:   Pertinent items are noted in HPI Denies fever/chills, dizziness, headaches, visual disturbances, fatigue, shortness of breath, chest pain, abdominal pain, vomiting, denies problems with periods, bowel movements, urination, or intercourse unless otherwise stated above.  Pertinent History Reviewed:  Reviewed past medical,surgical, social, obstetrical and family history.  Reviewed problem list, medications and allergies. Physical Assessment:   Vitals:   08/01/21 1445  BP: 121/70  Pulse: 79  Weight: 171 lb 6.4 oz (77.7 kg)  Height: 5\' 5"  (1.651 m)  Body mass index is 28.52 kg/m.       Physical Examination:   General appearance: alert, well appearing, and in no distress  Psych: mood appropriate, normal affect  Skin: warm & dry    Cardiovascular: normal heart rate noted  Respiratory: normal respiratory effort, no distress  Abdomen: soft, non-tender   Pelvic: normal external genitalia, vulva, vagina, cervix, uterus and adnexa  Extremities: no edema   Chaperone:     Colposcopy Procedure Note  Indications: ASCUS, HPV+  Procedure Details  The risks and benefits of the procedure and Written informed consent obtained.  Speculum placed in vagina and excellent visualization of cervix achieved, cervix swabbed x 3 with acetic acid solution.  Findings: Adequate colposcopy is noted today.  TMZ zone present  Cervix: acetowhite changes noted; ECC and cervical biopsies obtained.    Monsel's applied.  Adequate hemostasis noted  Specimens: ECC and cervical biopsies  Complications: none.  Colposcopic Impression: CIN 1   Plan(Based on 2019 ASCCP recommendations)  -Discussed HPV- reviewed incidence and its potential to cause condylomas to dysplasia to cervical cancer -Reviewed degree of abnormal pap smears  -Discussed ASCCP guidelines and current recommendations for colposcopy -As above, inform consent obtained and procedure completed -biopsies obtained, further management pending results -Questions and concerns were addressed  -Family planning Encouraged pt to f/u with prior back provider to get their opinion regarding pregnancy Discussed concern for back pain/worsening of current issue Would not advise primary C-section.  Also discussed that based on location of prior surgery would need to review with anesthesia if epidural was a possibility Advised pt to set up pre-conception counseling if desires pregnancy and wishes to  know about epidural prior to pregnancy  Myna Hidalgo, DO Attending Obstetrician & Gynecologist, [facility] for [facility], [facility]

## 2021-08-03 ENCOUNTER — Encounter: Payer: Self-pay | Admitting: Gastroenterology

## 2021-08-03 ENCOUNTER — Ambulatory Visit (AMBULATORY_SURGERY_CENTER): Payer: BC Managed Care – PPO | Admitting: Gastroenterology

## 2021-08-03 VITALS — BP 97/60 | HR 59 | Temp 98.9°F | Resp 12 | Ht 64.0 in | Wt 167.0 lb

## 2021-08-03 DIAGNOSIS — K2970 Gastritis, unspecified, without bleeding: Secondary | ICD-10-CM | POA: Diagnosis not present

## 2021-08-03 DIAGNOSIS — R1013 Epigastric pain: Secondary | ICD-10-CM

## 2021-08-03 DIAGNOSIS — K2950 Unspecified chronic gastritis without bleeding: Secondary | ICD-10-CM | POA: Diagnosis not present

## 2021-08-03 DIAGNOSIS — R1012 Left upper quadrant pain: Secondary | ICD-10-CM

## 2021-08-03 LAB — SURGICAL PATHOLOGY

## 2021-08-03 MED ORDER — SODIUM CHLORIDE 0.9 % IV SOLN: 500.0000 mL | Freq: Once | INTRAVENOUS | Status: DC

## 2021-08-03 NOTE — Progress Notes (Signed)
Called to room to assist during endoscopic procedure.  Patient ID and intended procedure confirmed with present staff. Received instructions for my participation in the procedure from the performing physician.  

## 2021-08-03 NOTE — Op Note (Signed)
[place] Patient Name: Lisa Christian Procedure Date: 08/03/2021 10:14 AM MRN: [id number] Endoscopist: Nicki Reaper E. Candis Schatz , MD Age: 31 Referring MD:  Date of Birth: Oct 12, 1990 Gender: Female Account #: [id number] Procedure:                Upper GI endoscopy Indications:              Epigastric abdominal pain Medicines:                Monitored Anesthesia Care Procedure:                Pre-Anesthesia Assessment:                           - Prior to the procedure, a History and Physical                            was performed, and patient medications and                            allergies were reviewed. The patient's tolerance of                            previous anesthesia was also reviewed. The risks                            and benefits of the procedure and the sedation                            options and risks were discussed with the patient.                            All questions were answered, and informed consent                            was obtained. Prior Anticoagulants: The patient has                            taken no previous anticoagulant or antiplatelet                            agents. ASA Grade Assessment: II - A patient with                            mild systemic disease. After reviewing the risks                            and benefits, the patient was deemed in                            satisfactory condition to undergo the procedure.                           After obtaining informed consent, the endoscope was  passed under direct vision. Throughout the                            procedure, the patient's blood pressure, pulse, and                            oxygen saturations were monitored continuously. The                            Endoscope was introduced through the mouth, and                            advanced to the third part of duodenum. The upper                            GI endoscopy was  accomplished without difficulty.                            The patient tolerated the procedure well. Scope In: Scope Out: Findings:                 The examined portions of the nasopharynx,                            oropharynx and larynx were normal.                           The examined esophagus was normal.                           The entire examined stomach was normal. Biopsies                            were taken with a cold forceps for Helicobacter                            pylori testing. Estimated blood loss was minimal.                           The examined duodenum was normal. Complications:            No immediate complications. Estimated Blood Loss:     Estimated blood loss was minimal. Impression:               - The examined portions of the nasopharynx,                            oropharynx and larynx were normal.                           - Normal esophagus.                           - Normal stomach. Biopsied.                           -  Normal examined duodenum.                           - No endoscopic abnormalities to explain patient's                            abdominal pain Recommendation:           - Patient has a contact number available for                            emergencies. The signs and symptoms of potential                            delayed complications were discussed with the                            patient. Return to normal activities tomorrow.                            Written discharge instructions were provided to the                            patient.                           - Resume previous diet.                           - Continue present medications.                           - Await pathology results.                           - Reconsider trial of omeprazole.                           - Follow up as needed in clinic. Hillel Card E. Tomasa Rand, MD 08/03/2021 10:34:59 AM This report has been signed electronically.

## 2021-08-03 NOTE — Progress Notes (Signed)
[facility] History and Physical   Primary Care Physician:  [facility]   Reason for Procedure:   Left upper quadrant/epigastric pain  Plan:    EGD     HPI: Lisa Christian is a 31 y.o. female undergoing EGD to evaluate persistent epigastric pain.  She was prescribed omeprazole but she has not taken it.  Pain is worse after eating.   Past Medical History:  Diagnosis Date   Anxiety    Family history of adverse reaction to anesthesia    " not sure why second cousin can't get put to sleep."   Gallbladder polyp    GERD (gastroesophageal reflux disease)    Headache    HNP (herniated nucleus pulposus), lumbar    Panic attacks    Pseudotumor cerebri    Vitamin D deficiency     Past Surgical History:  Procedure Laterality Date   BIOPSY N/A 01/11/2014   Procedure: BIOPSY;  Surgeon: West Bali, MD;  Location: [facility];  Service: Endoscopy;  Laterality: N/A;  duodenal and gastric   ESOPHAGOGASTRODUODENOSCOPY (EGD) WITH PROPOFOL N/A 01/11/2014   ZOX:[id number] non-erosive   LUMBAR LAMINECTOMY/DECOMPRESSION MICRODISCECTOMY Left 03/01/2019   Procedure: Microdiscectomy - Lumbar Four-Lumbar Five - left;  Surgeon: Donalee Citrin, MD;  Location: [facility] OR;  Service: Neurosurgery;  Laterality: Left;  Microdiscectomy - Lumbar Four-Lumbar Five - left   WISDOM TOOTH EXTRACTION      Prior to Admission medications   Medication Sig Start Date End Date Taking? Authorizing Provider  Cholecalciferol (VITAMIN D3) 10 MCG/ML LIQD Take 2 drops by mouth daily.   Yes [provider]  LORazepam (ATIVAN) 1 MG tablet Take 1 tablet at least 1 hour before appointment. Have someone drive you to appointment and home. 06/25/21  Yes Cheral Marker, CNM  Lactic Ac-Citric Ac-Pot Bitart (PHEXXI) 1.8-1-0.4 % GEL Insert 1 applicatorful vaginally up to an hour before sex Patient not taking: Reported on 08/01/2021 11/08/19   Cheral Marker, CNM  omeprazole (PRILOSEC) 20 MG  capsule Take 1 capsule (20 mg total) by mouth daily. Take 30 before breakfast Patient not taking: Reported on 08/01/2021 06/13/21   Meredith Pel, NP    Current Outpatient Medications  Medication Sig Dispense Refill   Cholecalciferol (VITAMIN D3) 10 MCG/ML LIQD Take 2 drops by mouth daily.     LORazepam (ATIVAN) 1 MG tablet Take 1 tablet at least 1 hour before appointment. Have someone drive you to appointment and home. 1 tablet 0   Lactic Ac-Citric Ac-Pot Bitart (PHEXXI) 1.8-1-0.4 % GEL Insert 1 applicatorful vaginally up to an hour before sex (Patient not taking: Reported on 08/01/2021) 5 g 11   omeprazole (PRILOSEC) 20 MG capsule Take 1 capsule (20 mg total) by mouth daily. Take 30 before breakfast (Patient not taking: Reported on 08/01/2021) 30 capsule 0   Current Facility-Administered Medications  Medication Dose Route Frequency Provider Last Rate Last Admin   0.9 %  sodium chloride infusion  500 mL Intravenous Once Jenel Lucks, MD        Allergies as of 08/03/2021 - Review Complete 08/03/2021  Allergen Reaction Noted   Latex  03/01/2019    Family History  Problem Relation Age of Onset   Anxiety disorder Mother    Gallbladder disease Mother    Breast cancer Maternal Grandmother    Heart murmur Maternal Grandmother    Heart disease Maternal Grandfather    Heart disease Paternal Grandmother    Stroke Paternal Grandfather  Pancreatic cancer Maternal Uncle    Colon cancer Maternal Great-grandmother     Social History   Socioeconomic History   Marital status: Significant Other    Spouse name: Not on file   Number of children: 1   Years of education: Not on file   Highest education level: Associate degree: occupational, Scientist, product/process development, or vocational program  Occupational History   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  Tobacco Use   Smoking status: Former    Types: Cigarettes    Passive exposure: Past   Smokeless tobacco: Never    Tobacco comments:    Some day smoker when having a drink once or twice a year for about 5 years  Vaping Use   Vaping Use: Never used  Substance and Sexual Activity   Alcohol use: Yes    Comment: very rarely   Drug use: No   Sexual activity: Yes    Comment: Phexxi for birth control  Other Topics Concern   Not on file  Social History Narrative   Lives with child   Social Determinants of Health   Financial Resource Strain: Low Risk  (05/04/2021)   Overall Financial Resource Strain (CARDIA)    Difficulty of Paying Living Expenses: Not hard at all  Food Insecurity: No Food Insecurity (05/04/2021)   Hunger Vital Sign    Worried About Running Out of Food in the Last Year: Never true    Ran Out of Food in the Last Year: Never true  Transportation Needs: No Transportation Needs (05/04/2021)   PRAPARE - Administrator, Civil Service (Medical): No    Lack of Transportation (Non-Medical): No  Physical Activity: Insufficiently Active (05/04/2021)   Exercise Vital Sign    Days of Exercise per Week: 3 days    Minutes of Exercise per Session: 20 min  Stress: No Stress Concern Present (05/04/2021)   [employer] of Occupational Health - Occupational Stress Questionnaire    Feeling of Stress : Only a little  Social Connections: Moderately Integrated (05/04/2021)   Social Connection and Isolation Panel [NHANES]    Frequency of Communication with Friends and Family: Twice a week    Frequency of Social Gatherings with Friends and Family: Once a week    Attends Religious Services: 1 to 4 times per year    Active Member of [employer] or Organizations: No    Attends Banker Meetings: Never    Marital Status: Living with partner  Intimate Partner Violence: Not At Risk (05/04/2021)   Humiliation, Afraid, Rape, and Kick questionnaire    Fear of Current or Ex-Partner: No    Emotionally Abused: No    Physically Abused: No    Sexually Abused: No    Review of Systems:  All  other review of systems negative except as mentioned in the HPI.  Physical Exam: Vital signs BP 114/73   Pulse 72   Temp 98.9 F (37.2 C)   Ht 5\' 4"  (1.626 m)   Wt 167 lb (75.8 kg)   LMP 07/21/2021   SpO2 99%   BMI 28.67 kg/m   General:   Alert,  Well-developed, well-nourished, pleasant and cooperative in NAD Airway:  Mallampati 2 Lungs:  Clear throughout to auscultation.   Heart:  Regular rate and rhythm; no murmurs, clicks, rubs,  or gallops. Abdomen:  Soft, nontender and nondistended. Normal bowel sounds.   Neuro/Psych:  Normal mood and affect. A and O x 3   Charlcie Prisco E.  Candis Schatz, MD [facility]

## 2021-08-03 NOTE — Patient Instructions (Signed)
Read all of the handouts given to you by your recovery room nurse.  You might reconsider a trial of omeprazole.  YOU HAD AN ENDOSCOPIC PROCEDURE TODAY AT THE [facility]:   Refer to the procedure report that was given to you for any specific questions about what was found during the examination.  If the procedure report does not answer your questions, please call your gastroenterologist to clarify.  If you requested that your care partner not be given the details of your procedure findings, then the procedure report has been included in a sealed envelope for you to review at your convenience later.  YOU SHOULD EXPECT: Some feelings of bloating in the abdomen. Passage of more gas than usual.  Walking can help get rid of the air that was put into your GI tract during the procedure and reduce the bloating.   Please Note:  You might notice some irritation and congestion in your nose or some drainage.  This is from the oxygen used during your procedure.  There is no need for concern and it should clear up in a day or so.  SYMPTOMS TO REPORT IMMEDIATELY:Following lower endoscopy (colonoscopy or flexible sigmoidoscopy):    Following upper endoscopy (EGD)  Vomiting of blood or coffee ground material  New chest pain or pain under the shoulder blades  Painful or persistently difficult swallowing  New shortness of breath  Fever of 100F or higher  Black, tarry-looking stools  For urgent or emergent issues, a gastroenterologist can be reached at any hour by calling [phone]. Do not use MyChart messaging for urgent concerns.    DIET:  We do recommend a small meal at first, but then you may proceed to your regular diet.  Drink plenty of fluids but you should avoid alcoholic beverages for 24 hours.  ACTIVITY:  You should plan to take it easy for the rest of today and you should NOT DRIVE or use heavy machinery until tomorrow (because of the sedation medicines used during the test).     FOLLOW UP: Our staff will call the number listed on your records the next business day following your procedure.  We will call around 7:15- 8:00 am to check on you and address any questions or concerns that you may have regarding the information given to you following your procedure. If we do not reach you, we will leave a message.  If you develop any symptoms (ie: fever, flu-like symptoms, shortness of breath, cough etc.) before then, please call [phone].  If you test positive for Covid 19 in the 2 weeks post procedure, please call and report this information to [place].    If any biopsies were taken you will be contacted by phone or by letter within the next 1-3 weeks.  Please call us at [phone] if you have not heard about the biopsies in 3 weeks.    SIGNATURES/CONFIDENTIALITY: You and/or your care partner have signed paperwork which will be entered into your electronic medical record.  These signatures attest to the fact that that the information above on your After Visit Summary has been reviewed and is understood.  Full responsibility of the confidentiality of this discharge information lies with you and/or your care-partner.

## 2021-08-03 NOTE — Progress Notes (Signed)
Sedate, gd SR, tolerated procedure well, VSS, report to RN 

## 2021-08-06 ENCOUNTER — Telehealth: Payer: Self-pay | Admitting: *Deleted

## 2021-08-06 NOTE — Telephone Encounter (Signed)
Post procedure follow up call placed, no answer and left VM.  

## 2021-08-08 NOTE — Progress Notes (Signed)
Lisa Christian,  The biopsies taken from your stomach were notable for mild reactive gastropathy which is a common finding and often related to use of certain medications (usually NSAIDs), but there was no evidence of Helicobacter pylori infection. This common finding is not felt to necessarily be a cause of any particular symptom and there is no specific treatment or further evaluation recommended.

## 2021-08-09 ENCOUNTER — Other Ambulatory Visit: Payer: Self-pay | Admitting: Adult Health

## 2021-08-09 MED ORDER — FLUCONAZOLE 150 MG PO TABS: ORAL_TABLET | ORAL | 1 refills | Status: DC

## 2021-08-09 NOTE — Progress Notes (Signed)
Will rx diflucan  

## 2021-10-09 ENCOUNTER — Ambulatory Visit (INDEPENDENT_AMBULATORY_CARE_PROVIDER_SITE_OTHER): Payer: BC Managed Care – PPO | Admitting: *Deleted

## 2021-10-09 ENCOUNTER — Encounter: Payer: Self-pay | Admitting: *Deleted

## 2021-10-09 ENCOUNTER — Other Ambulatory Visit: Payer: Self-pay | Admitting: Adult Health

## 2021-10-09 VITALS — BP 110/68 | HR 60

## 2021-10-09 DIAGNOSIS — Z3201 Encounter for pregnancy test, result positive: Secondary | ICD-10-CM | POA: Diagnosis not present

## 2021-10-09 LAB — POCT URINE PREGNANCY: Preg Test, Ur: POSITIVE — AB

## 2021-10-09 MED ORDER — PROMETHAZINE HCL 25 MG PO TABS: 25.0000 mg | ORAL_TABLET | Freq: Four times a day (QID) | ORAL | 1 refills | Status: DC | PRN

## 2021-10-09 NOTE — Progress Notes (Signed)
   NURSE VISIT- PREGNANCY CONFIRMATION   SUBJECTIVE:  Lisa Christian is a 31 y.o. G13P1001 female at [redacted]w[redacted]d by certain LMP of Patient's last menstrual period was 08/18/2021. Here for pregnancy confirmation.  Home pregnancy test: positive x 2   She reports nausea.  She is not taking prenatal vitamins.    OBJECTIVE:  BP 110/68 (BP Location: Right Arm, Patient Position: Sitting, Cuff Size: Normal)   Pulse 60   LMP 08/18/2021   Appears well, in no apparent distress  Results for orders placed or performed in visit on 10/09/21 (from the past 24 hour(s))  POCT urine pregnancy   Collection Time: 10/09/21  2:46 PM  Result Value Ref Range   Preg Test, Ur Positive (A) Negative    ASSESSMENT: Positive pregnancy test, [redacted]w[redacted]d by LMP    PLAN: Schedule for dating ultrasound in next available days Prenatal vitamins: plans to begin OTC ASAP   Nausea medicines: requested-note routed to Derrek Monaco to send prescription   OB packet given: Yes  Janece Canterbury  10/09/2021 2:47 PM

## 2021-10-09 NOTE — Progress Notes (Signed)
Will rx phenergan  

## 2021-10-10 ENCOUNTER — Encounter: Payer: Self-pay | Admitting: Women's Health

## 2021-10-16 ENCOUNTER — Other Ambulatory Visit: Payer: Self-pay | Admitting: Obstetrics & Gynecology

## 2021-10-16 DIAGNOSIS — O3680X0 Pregnancy with inconclusive fetal viability, not applicable or unspecified: Secondary | ICD-10-CM

## 2021-10-17 ENCOUNTER — Other Ambulatory Visit: Payer: BC Managed Care – PPO

## 2021-10-19 ENCOUNTER — Ambulatory Visit (INDEPENDENT_AMBULATORY_CARE_PROVIDER_SITE_OTHER): Payer: BC Managed Care – PPO

## 2021-10-19 DIAGNOSIS — Z3A08 8 weeks gestation of pregnancy: Secondary | ICD-10-CM | POA: Diagnosis not present

## 2021-10-19 DIAGNOSIS — O3680X0 Pregnancy with inconclusive fetal viability, not applicable or unspecified: Secondary | ICD-10-CM

## 2021-10-19 NOTE — Progress Notes (Signed)
[place] 8+6 wks,single IUP with yolk sac,subchronic hemorrhage 2.6 x 1.2 x 2.8 cm,CRL 27.50 mm,normal ovaries,FHR 176 bpm

## 2021-11-14 ENCOUNTER — Other Ambulatory Visit: Payer: Self-pay | Admitting: Obstetrics & Gynecology

## 2021-11-14 ENCOUNTER — Encounter: Payer: Self-pay | Admitting: Obstetrics & Gynecology

## 2021-11-14 DIAGNOSIS — Z3682 Encounter for antenatal screening for nuchal translucency: Secondary | ICD-10-CM

## 2021-11-14 DIAGNOSIS — Z3490 Encounter for supervision of normal pregnancy, unspecified, unspecified trimester: Secondary | ICD-10-CM | POA: Insufficient documentation

## 2021-11-15 ENCOUNTER — Ambulatory Visit (INDEPENDENT_AMBULATORY_CARE_PROVIDER_SITE_OTHER): Payer: BC Managed Care – PPO | Admitting: Obstetrics & Gynecology

## 2021-11-15 ENCOUNTER — Ambulatory Visit: Payer: BC Managed Care – PPO | Admitting: *Deleted

## 2021-11-15 ENCOUNTER — Encounter: Payer: Self-pay | Admitting: Obstetrics & Gynecology

## 2021-11-15 ENCOUNTER — Ambulatory Visit (INDEPENDENT_AMBULATORY_CARE_PROVIDER_SITE_OTHER): Payer: BC Managed Care – PPO

## 2021-11-15 VITALS — BP 110/72 | HR 83 | Wt 164.0 lb

## 2021-11-15 DIAGNOSIS — Z3682 Encounter for antenatal screening for nuchal translucency: Secondary | ICD-10-CM

## 2021-11-15 DIAGNOSIS — Z113 Encounter for screening for infections with a predominantly sexual mode of transmission: Secondary | ICD-10-CM

## 2021-11-15 DIAGNOSIS — M5126 Other intervertebral disc displacement, lumbar region: Secondary | ICD-10-CM

## 2021-11-15 DIAGNOSIS — Z31430 Encounter of female for testing for genetic disease carrier status for procreative management: Secondary | ICD-10-CM

## 2021-11-15 DIAGNOSIS — Z3480 Encounter for supervision of other normal pregnancy, unspecified trimester: Secondary | ICD-10-CM

## 2021-11-15 DIAGNOSIS — Z3481 Encounter for supervision of other normal pregnancy, first trimester: Secondary | ICD-10-CM

## 2021-11-15 DIAGNOSIS — Z3A12 12 weeks gestation of pregnancy: Secondary | ICD-10-CM

## 2021-11-15 NOTE — Progress Notes (Signed)
INITIAL OBSTETRICAL VISIT Patient name: Lisa Christian MRN [id number]  Date of birth: April 13, 1990 Chief Complaint:   Initial Prenatal Visit  History of Present Illness:   EVANA RUNNELS is a 31 y.o. G17P1001 Caucasian female at [redacted]w[redacted]d by LMP c/w u/s at 12 weeks with an Estimated Date of Delivery: 05/25/22 being seen today for her initial obstetrical visit.   Her obstetrical history is significant for:  -prior h/o shoulder dystocia  Today she reports  some mild nausea/vomiting- not interested in medication .     11/15/2021   11:04 AM 05/04/2021   10:48 AM 02/02/2018    2:46 PM  Depression screen PHQ 2/9  Decreased Interest 0 0 0  Down, Depressed, Hopeless 0 0 0  PHQ - 2 Score 0 0 0  Altered sleeping 0 1   Tired, decreased energy 0 0   Change in appetite 0 0   Feeling bad or failure about yourself  0 0   Trouble concentrating 0 0   Moving slowly or fidgety/restless 0 0   Suicidal thoughts 0 0   PHQ-9 Score 0 1     Patient's last menstrual period was 08/18/2021. Last pap 04/2021- ASCUS, HPV pos, s/p colposcopy 07/2021 - negative, []  repeat pap July 2024.  Review of Systems:   Pertinent items are noted in HPI Denies cramping/contractions, leakage of fluid, vaginal bleeding, abnormal vaginal discharge w/ itching/odor/irritation, headaches, visual changes, shortness of breath, chest pain, abdominal pain, severe nausea/vomiting, or problems with urination or bowel movements unless otherwise stated above.  Pertinent History Reviewed:  Reviewed past medical,surgical, social, obstetrical and family history.  Reviewed problem list, medications and allergies. OB History  Gravida Para Term Preterm AB Living  2 1 1     1   SAB IAB Ectopic Multiple Live Births        0 1    # Outcome Date GA Lbr Len/2nd Weight Sex Delivery Anes PTL Lv  2 Current           1 Term 01/10/15 [redacted]w[redacted]d 11:58 / 01:41 9 lb 0.4 oz (4.094 kg) F Vag-Spont EPI  LIV     Complications: Shoulder Dystocia   Physical  Assessment:   Vitals:   11/15/21 1155  BP: 110/72  Pulse: 83  Weight: 164 lb (74.4 kg)  Body mass index is 28.15 kg/m.       Physical Examination:  General appearance - well appearing, and in no distress  Mental status - alert, oriented to person, place, and time  Psych:  She has a normal mood and affect  Skin - warm and dry, normal color, no suspicious lesions noted  Chest - effort normal, all lung fields clear to auscultation bilaterally  Heart - normal rate and regular rhythm  Abdomen - soft, nontender  Extremities:  No swelling or varicosities noted  Pelvic - VULVA: normal appearing vulva with no masses, tenderness or lesions  VAGINA: normal appearing vagina with normal color and discharge, no lesions  CERVIX: normal appearing cervix without discharge or lesions, no CMT   Chaperone:  family member present     TODAY'S NT [place] 12+5 wks,CRL 83.65 mm,NB present,NT 2.1 mm,FHR 164 bpm,normal ovaries,anterior placenta  No results found for this or any previous visit (from the past 24 hour(s)).  Assessment & Plan:  1) Low-Risk Pregnancy G2P1001 at [redacted]w[redacted]d with an Estimated Date of Delivery: 05/25/22   2) Initial OB visit  3) Prior h/o shoulder dystocia -denies GDM or diabetes -notes minimal weight  gain with prior pregnancy -reviewed risk of shoulder dystocia, plan to review route of delivery closer to term -based on history suspect no issues of proceeding with vaginal delivery -[]  plan for 3rd trimester [place] for EFW  Meds: No orders of the defined types were placed in this encounter.   Initial labs obtained Continue prenatal vitamins Reviewed n/v relief measures and warning s/s to report Reviewed recommended weight gain based on pre-gravid BMI Encouraged well-balanced diet Genetic & carrier screening discussed: requests Panorama and Horizon ,  Ultrasound discussed; fetal survey: results reviewed [place] completed> form faxed if has or is planning to apply for medicaid The nature of  [place] for [employer] with multiple MDs and other Advanced Practice Providers was explained to patient; also emphasized that fellows, residents, and students are part of our team.  Indications for ASA therapy (per uptodate) One of the following: H/O preeclampsia, especially early onset/adverse outcome No Multifetal gestation No CHTN No T1DM or T2DM No Chronic kidney disease No Autoimmune disease (antiphospholipid syndrome, systemic lupus erythematosus) No  OR Two or more of the following: Nulliparity No Obesity (BMI>30 kg/m2) No Family h/o preeclampsia in mother or sister No Age ?35 years No Sociodemographic characteristics (African American race, low socioeconomic level) No Personal risk factors (eg, previous pregnancy w/ LBW or SGA, previous adverse pregnancy outcome [eg, stillbirth], interval >10 years between pregnancies) No  Indications for early A1C (per uptodate) BMI >=25 (>=23 in Asian women) AND one of the following GDM in a previous pregnancy No Previous A1C?5.7, impaired glucose tolerance, or impaired fasting glucose on previous testing No First-degree relative with diabetes No High-risk race/ethnicity (eg, African American, Latino, Native American, Asian American, Pacific Islander) No History of cardiovascular disease No HTN or on therapy for hypertension No HDL cholesterol level <35 mg/dL (0.90 mmol/L) and/or a triglyceride level >250 mg/dL (2.82 mmol/L) No PCOS No Physical inactivity Yes Other clinical condition associated with insulin resistance (eg, severe obesity, acanthosis nigricans) No Previous birth of an infant weighing ?4000 g Yes Previous stillbirth of unknown cause No >= 40yo No  Follow-up: Return in about 4 weeks (around 12/13/2021) for [place] visit.   Orders Placed This Encounter  Procedures   Urine Culture   GC/Chlamydia Probe Amp   CBC/D/Plt+RPR+Rh+ABO+RubIgG...   Integrated 1   HgB A1c   Panorama Prenatal Test Full Panel    HORIZON Custom   POC Urinalysis Dipstick OB    Janyth Pupa, DO Attending [place], [place] for [employer], [place]

## 2021-11-15 NOTE — Progress Notes (Signed)
[place] 12+5 wks,CRL 83.65 mm,NB present,NT 2.1 mm,FHR 164 bpm,normal ovaries,anterior placenta

## 2021-11-16 LAB — HEMOGLOBIN A1C
Est. average glucose Bld gHb Est-mCnc: 94 mg/dL
Hgb A1c MFr Bld: 4.9 % (ref 4.8–5.6)

## 2021-11-18 LAB — GC/CHLAMYDIA PROBE AMP
Chlamydia trachomatis, NAA: NEGATIVE
Neisseria Gonorrhoeae by PCR: NEGATIVE

## 2021-11-21 ENCOUNTER — Ambulatory Visit: Payer: BC Managed Care – PPO | Admitting: *Deleted

## 2021-11-21 ENCOUNTER — Other Ambulatory Visit: Payer: BC Managed Care – PPO

## 2021-11-21 ENCOUNTER — Encounter: Payer: BC Managed Care – PPO | Admitting: Medical

## 2021-11-21 LAB — URINE CULTURE

## 2021-11-22 ENCOUNTER — Other Ambulatory Visit: Payer: BC Managed Care – PPO

## 2021-11-22 DIAGNOSIS — R1012 Left upper quadrant pain: Secondary | ICD-10-CM

## 2021-11-22 DIAGNOSIS — R1013 Epigastric pain: Secondary | ICD-10-CM

## 2021-11-22 LAB — PANORAMA PRENATAL TEST FULL PANEL:PANORAMA TEST PLUS 5 ADDITIONAL MICRODELETIONS: FETAL FRACTION: 9

## 2021-11-24 LAB — INTEGRATED 1
Crown Rump Length: 83.7 mm
Gest. Age on Collection Date: 13.9 weeks
Maternal Age at EDD: 31.5 yr
Nuchal Translucency (NT): 2.1 mm
Number of Fetuses: 1
PAPP-A Value: 4603.6 ng/mL
Weight: 84 [lb_av]

## 2021-11-24 LAB — CBC/D/PLT+RPR+RH+ABO+RUBIGG...
Antibody Screen: NEGATIVE
Basophils Absolute: 0 10*3/uL (ref 0.0–0.2)
Basos: 0 %
EOS (ABSOLUTE): 0.1 10*3/uL (ref 0.0–0.4)
Eos: 1 %
HCV Ab: NONREACTIVE
HIV Screen 4th Generation wRfx: NONREACTIVE
Hematocrit: 37.4 % (ref 34.0–46.6)
Hemoglobin: 13.1 g/dL (ref 11.1–15.9)
Hepatitis B Surface Ag: NEGATIVE
Immature Grans (Abs): 0 10*3/uL (ref 0.0–0.1)
Immature Granulocytes: 0 %
Lymphocytes Absolute: 1.8 10*3/uL (ref 0.7–3.1)
Lymphs: 18 %
MCH: 32.8 pg (ref 26.6–33.0)
MCHC: 35 g/dL (ref 31.5–35.7)
MCV: 94 fL (ref 79–97)
Monocytes Absolute: 0.3 10*3/uL (ref 0.1–0.9)
Monocytes: 3 %
Neutrophils Absolute: 8.2 10*3/uL — ABNORMAL HIGH (ref 1.4–7.0)
Neutrophils: 78 %
Platelets: 231 10*3/uL (ref 150–450)
RBC: 4 x10E6/uL (ref 3.77–5.28)
RDW: 13.1 % (ref 11.7–15.4)
RPR Ser Ql: NONREACTIVE
Rh Factor: POSITIVE
Rubella Antibodies, IGG: 1.92 index (ref >0.99)
WBC: 10.5 10*3/uL (ref 3.4–10.8)

## 2021-11-24 LAB — HCV INTERPRETATION

## 2021-11-28 LAB — HORIZON CUSTOM: REPORT SUMMARY: NEGATIVE

## 2021-12-07 ENCOUNTER — Encounter: Payer: Self-pay | Admitting: Women's Health

## 2021-12-17 ENCOUNTER — Ambulatory Visit (INDEPENDENT_AMBULATORY_CARE_PROVIDER_SITE_OTHER): Payer: BC Managed Care – PPO | Admitting: Women's Health

## 2021-12-17 ENCOUNTER — Encounter: Payer: Self-pay | Admitting: Women's Health

## 2021-12-17 VITALS — BP 106/65 | HR 83 | Wt 165.1 lb

## 2021-12-17 DIAGNOSIS — Z3482 Encounter for supervision of other normal pregnancy, second trimester: Secondary | ICD-10-CM

## 2021-12-17 DIAGNOSIS — Z363 Encounter for antenatal screening for malformations: Secondary | ICD-10-CM

## 2021-12-17 DIAGNOSIS — Z3A17 17 weeks gestation of pregnancy: Secondary | ICD-10-CM

## 2021-12-17 DIAGNOSIS — Z3480 Encounter for supervision of other normal pregnancy, unspecified trimester: Secondary | ICD-10-CM

## 2021-12-17 NOTE — Progress Notes (Signed)
LOW-RISK PREGNANCY VISIT Patient name: Lisa Christian MRN [id number]  Date of birth: 1990/11/23 Chief Complaint:   Routine Prenatal Visit  History of Present Illness:   Lisa Christian is a 31 y.o. G83P1001 female at [redacted]w[redacted]d with an Estimated Date of Delivery: 05/25/22 being seen today for ongoing management of a low-risk pregnancy.   Today she reports  needs to go to dentist, asking what she can have done- dental release given . Contractions: Not present. Vag. Bleeding: None.  Movement: Absent. denies leaking of fluid.     11/15/2021   11:04 AM 05/04/2021   10:48 AM 02/02/2018    2:46 PM  Depression screen PHQ 2/9  Decreased Interest 0 0 0  Down, Depressed, Hopeless 0 0 0  PHQ - 2 Score 0 0 0  Altered sleeping 0 1   Tired, decreased energy 0 0   Change in appetite 0 0   Feeling bad or failure about yourself  0 0   Trouble concentrating 0 0   Moving slowly or fidgety/restless 0 0   Suicidal thoughts 0 0   PHQ-9 Score 0 1         11/15/2021   11:04 AM 05/04/2021   10:49 AM  GAD 7 : Generalized Anxiety Score  Nervous, Anxious, on Edge 0 0  Control/stop worrying 0 0  Worry too much - different things 0 0  Trouble relaxing 1 0  Restless 0 0  Easily annoyed or irritable 0 0  Afraid - awful might happen 0 0  Total GAD 7 Score 1 0      Review of Systems:   Pertinent items are noted in HPI Denies abnormal vaginal discharge w/ itching/odor/irritation, headaches, visual changes, shortness of breath, chest pain, abdominal pain, severe nausea/vomiting, or problems with urination or bowel movements unless otherwise stated above. Pertinent History Reviewed:  Reviewed past medical,surgical, social, obstetrical and family history.  Reviewed problem list, medications and allergies. Physical Assessment:   Vitals:   12/17/21 1415  BP: 106/65  Pulse: 83  Weight: 165 lb 2 oz (74.9 kg)  Body mass index is 28.34 kg/m.        Physical Examination:   General appearance: Well  appearing, and in no distress  Mental status: Alert, oriented to person, place, and time  Skin: Warm & dry  Cardiovascular: Normal heart rate noted  Respiratory: Normal respiratory effort, no distress  Abdomen: Soft, gravid, nontender  Pelvic: Cervical exam deferred         Extremities: Edema: None  Fetal Status: Fetal Heart Rate (bpm): 146   Movement: Absent    Chaperone: N/A   No results found for this or any previous visit (from the past 24 hour(s)).  Assessment & Plan:  1) Low-risk pregnancy G2P1001 at [redacted]w[redacted]d with an Estimated Date of Delivery: 05/25/22   2) H/O shoulder dystocia, plan 36wk EFW  3) S/P laminectomy L4-L5- plan anesthesia consult regarding epidural   Meds: No orders of the defined types were placed in this encounter.  Labs/procedures today: 2nd IT  Plan:  Continue routine obstetrical care  Next visit: prefers will be in person for u/s     Reviewed: Preterm labor symptoms and general obstetric precautions including but not limited to vaginal bleeding, contractions, leaking of fluid and fetal movement were reviewed in detail with the patient.  All questions were answered. Does have home bp cuff. Office bp cuff given: not applicable. Check bp weekly, let us know if consistently >140 and/or >  90.  Follow-up: Return for As scheduled.  Future Appointments  Date Time Provider Department Center  01/07/2022  2:15 PM [facility] [place] [facility] None  01/07/2022  3:10 PM Cheral Marker, CNM [facility]    Orders Placed This Encounter  Procedures   US OB Comp + 14 Wk   INTEGRATED 2   Cheral Marker CNM, [facility] 12/17/2021 2:39 PM

## 2021-12-17 NOTE — Patient Instructions (Signed)
Lisa Christian, thank you for choosing our office today! We appreciate the opportunity to meet your healthcare needs. You may receive a short survey by mail, e-mail, or through MyChart. If you are happy with your care we would appreciate if you could take just a few minutes to complete the survey questions. We read all of your comments and take your feedback very seriously. Thank you again for choosing our office.  [facility] Team at [facility] ([address], [place] [address]) Entrance C, located off of [address] Free 24/7 valet parking  Go to [email or web address] to register for FREE online childbirth classes  Call the office ([phone]) or go to [facility] if: You begin to severe cramping Your water breaks.  Sometimes it is a big gush of fluid, sometimes it is just a trickle that keeps getting your panties wet or running down your legs You have vaginal bleeding.  It is normal to have a small amount of spotting if your cervix was checked.   [place] Pediatricians/Family Doctors [facility] ([facility]): [address], [phone]           [facility]: [address], [phone]                [facility] ([facility]): [address], [phone] (call to ask if accepting patients) [facility]: [address], [place], [phone]    [place] Pediatricians/Family Doctors [facility] ([facility]): [address], [phone] [facility]: [address], [phone] [facility]: [address], [phone]  [place] [facility]  [facility] ([facility]): [phone] [facility]: [address], [phone]   [place] [facility]: [address], [phone]  [place] [facility]  [facility]: [address], [phone]  Home Blood Pressure Monitoring for Patients   Your provider has recommended that you check your blood pressure (BP) at least once a week at home. If you do not have a blood pressure cuff at home, one will be provided for you. Contact your provider if you have not received your monitor within 1 week.   Helpful Tips for Accurate Home Blood Pressure Checks  Don't smoke, exercise, or drink caffeine 30 minutes before checking your BP Use the restroom before checking your BP (a full bladder can raise your pressure) Relax in a comfortable upright chair Feet on the ground Left arm resting comfortably on a flat surface at the level of your heart Legs uncrossed Back supported Sit quietly and don't talk Place the cuff on your bare arm Adjust snuggly, so that only two fingertips can fit between your skin and the top of the cuff Check 2 readings separated by at least one minute Keep a log of your BP readings For a visual, please reference this diagram: http://ccnc.care/bpdiagram  Provider Name: [facility]     Phone: [phone]  Zone 1: ALL CLEAR  Continue to monitor your symptoms:  BP reading is less than 140 (top number) or less than 90 (bottom number)  No right upper stomach pain No headaches or seeing spots No feeling nauseated or throwing up No swelling in face and hands  Zone 2: CAUTION Call your doctor's office for any of the following:  BP reading is greater than 140 (top number) or greater than   90 (bottom number)  Stomach pain under your ribs in the middle or right side Headaches or seeing spots Feeling nauseated or throwing up Swelling in face and hands  Zone 3: EMERGENCY  Seek immediate medical care if you have any of the following:  BP reading is greater than160 (top number) or greater than 110 (bottom number) Severe headaches not improving with Tylenol Serious difficulty catching your breath Any worsening symptoms from  Zone 2     Second Trimester of Pregnancy The second trimester is from week 14 through week 27 (months 4 through 6). The second trimester is often a time when you feel your best. Your body has adjusted to being pregnant, and you begin to feel better physically. Usually, morning sickness has lessened or quit completely, you may have more energy, and you may have an increase in appetite. The second trimester is also a time when the fetus is growing rapidly. At the end of the sixth month, the fetus is about 9 inches long and weighs about 1 pounds. You will likely begin to feel the baby move (quickening) between 16 and 20 weeks of pregnancy. Body changes during your second trimester Your body continues to go through many changes during your second trimester. The changes vary from woman to woman. Your weight will continue to increase. You will notice your lower abdomen bulging out. You may begin to get stretch marks on your hips, abdomen, and breasts. You may develop headaches that can be relieved by medicines. The medicines should be approved by your health care provider. You may urinate more often because the fetus is pressing on your bladder. You may develop or continue to have heartburn as a result of your pregnancy. You may develop constipation because certain hormones are causing the muscles that push waste through your intestines to slow down. You may develop hemorrhoids or swollen, bulging veins (varicose veins). You may have back pain. This is caused by: Weight gain. Pregnancy hormones that are relaxing the joints in your pelvis. A shift in weight and the muscles that support your balance. Your breasts will continue to grow and they will continue to become tender. Your gums may bleed and may be sensitive to brushing and flossing. Dark spots or blotches (chloasma, mask of pregnancy) may develop on your face. This will likely fade after the baby is born. A dark line from your belly button to  the pubic area (linea nigra) may appear. This will likely fade after the baby is born. You may have changes in your hair. These can include thickening of your hair, rapid growth, and changes in texture. Some women also have hair loss during or after pregnancy, or hair that feels dry or thin. Your hair will most likely return to normal after your baby is born.  What to expect at prenatal visits During a routine prenatal visit: You will be weighed to make sure you and the fetus are growing normally. Your blood pressure will be taken. Your abdomen will be measured to track your baby's growth. The fetal heartbeat will be listened to. Any test results from the previous visit will be discussed.  Your health care provider may ask you: How you are feeling. If you are feeling the baby move. If you have had any abnormal symptoms, such as leaking fluid, bleeding, severe headaches, or abdominal cramping. If you are using any tobacco products, including cigarettes, chewing tobacco, and electronic cigarettes. If you have any questions.  Other tests that may be performed during   your second trimester include: Blood tests that check for: Low iron levels (anemia). High blood sugar that affects pregnant women (gestational diabetes) between 24 and 28 weeks. Rh antibodies. This is to check for a protein on red blood cells (Rh factor). Urine tests to check for infections, diabetes, or protein in the urine. An ultrasound to confirm the proper growth and development of the baby. An amniocentesis to check for possible genetic problems. Fetal screens for spina bifida and Down syndrome. HIV (human immunodeficiency virus) testing. Routine prenatal testing includes screening for HIV, unless you choose not to have this test.  Follow these instructions at home: Medicines Follow your health care provider's instructions regarding medicine use. Specific medicines may be either safe or unsafe to take during  pregnancy. Take a prenatal vitamin that contains at least 600 micrograms (mcg) of folic acid. If you develop constipation, try taking a stool softener if your health care provider approves. Eating and drinking Eat a balanced diet that includes fresh fruits and vegetables, whole grains, good sources of protein such as meat, eggs, or tofu, and low-fat dairy. Your health care provider will help you determine the amount of weight gain that is right for you. Avoid raw meat and uncooked cheese. These carry germs that can cause birth defects in the baby. If you have low calcium intake from food, talk to your health care provider about whether you should take a daily calcium supplement. Limit foods that are high in fat and processed sugars, such as fried and sweet foods. To prevent constipation: Drink enough fluid to keep your urine clear or pale yellow. Eat foods that are high in fiber, such as fresh fruits and vegetables, whole grains, and beans. Activity Exercise only as directed by your health care provider. Most women can continue their usual exercise routine during pregnancy. Try to exercise for 30 minutes at least 5 days a week. Stop exercising if you experience uterine contractions. Avoid heavy lifting, wear low heel shoes, and practice good posture. A sexual relationship may be continued unless your health care provider directs you otherwise. Relieving pain and discomfort Wear a good support bra to prevent discomfort from breast tenderness. Take warm sitz baths to soothe any pain or discomfort caused by hemorrhoids. Use hemorrhoid cream if your health care provider approves. Rest with your legs elevated if you have leg cramps or low back pain. If you develop varicose veins, wear support hose. Elevate your feet for 15 minutes, 3-4 times a day. Limit salt in your diet. Prenatal Care Write down your questions. Take them to your prenatal visits. Keep all your prenatal visits as told by your health  care provider. This is important. Safety Wear your seat belt at all times when driving. Make a list of emergency phone numbers, including numbers for family, friends, the hospital, and police and fire departments. General instructions Ask your health care provider for a referral to a local prenatal education class. Begin classes no later than the beginning of month 6 of your pregnancy. Ask for help if you have counseling or nutritional needs during pregnancy. Your health care provider can offer advice or refer you to specialists for help with various needs. Do not use hot tubs, steam rooms, or saunas. Do not douche or use tampons or scented sanitary pads. Do not cross your legs for long periods of time. Avoid cat litter boxes and soil used by cats. These carry germs that can cause birth defects in the baby and possibly loss of the   fetus by miscarriage or stillbirth. Avoid all smoking, herbs, alcohol, and unprescribed drugs. Chemicals in these products can affect the formation and growth of the baby. Do not use any products that contain nicotine or tobacco, such as cigarettes and e-cigarettes. If you need help quitting, ask your health care provider. Visit your dentist if you have not gone yet during your pregnancy. Use a soft toothbrush to brush your teeth and be gentle when you floss. Contact a health care provider if: You have dizziness. You have mild pelvic cramps, pelvic pressure, or nagging pain in the abdominal area. You have persistent nausea, vomiting, or diarrhea. You have a bad smelling vaginal discharge. You have pain when you urinate. Get help right away if: You have a fever. You are leaking fluid from your vagina. You have spotting or bleeding from your vagina. You have severe abdominal cramping or pain. You have rapid weight gain or weight loss. You have shortness of breath with chest pain. You notice sudden or extreme swelling of your face, hands, ankles, feet, or legs. You  have not felt your baby move in over an hour. You have severe headaches that do not go away when you take medicine. You have vision changes. Summary The second trimester is from week 14 through week 27 (months 4 through 6). It is also a time when the fetus is growing rapidly. Your body goes through many changes during pregnancy. The changes vary from woman to woman. Avoid all smoking, herbs, alcohol, and unprescribed drugs. These chemicals affect the formation and growth your baby. Do not use any tobacco products, such as cigarettes, chewing tobacco, and e-cigarettes. If you need help quitting, ask your health care provider. Contact your health care provider if you have any questions. Keep all prenatal visits as told by your health care provider. This is important. This information is not intended to replace advice given to you by your health care provider. Make sure you discuss any questions you have with your health care provider. Document Released: 01/01/2001 Document Revised: 06/15/2015 Document Reviewed: 03/10/2012 Elsevier Interactive Patient Education  2017 [employer]  

## 2021-12-19 LAB — INTEGRATED 2
AFP MoM: 0.64
Alpha-Fetoprotein: 37.7 ng/mL
Crown Rump Length: 83.7 mm
DIA MoM: 0.69
DIA Value: 160.3 pg/mL
Estriol, Unconjugated: 1.96 ng/mL
Gest. Age on Collection Date: 13.9 weeks
Gestational Age: 18.4 weeks
Maternal Age at EDD: 31.5 yr
Nuchal Translucency (NT): 2.1 mm
Nuchal Translucency MoM: 1.04
Number of Fetuses: 1
PAPP-A MoM: 1.49
PAPP-A Value: 4603.6 ng/mL
Test Results:: NEGATIVE
Weight: 84 [lb_av]
Weight: 84 [lb_av]
hCG MoM: 1.28
hCG Value: 42 IU/mL
uE3 MoM: 1.12

## 2021-12-20 ENCOUNTER — Other Ambulatory Visit: Payer: Self-pay

## 2021-12-20 ENCOUNTER — Inpatient Hospital Stay (HOSPITAL_COMMUNITY)
Admission: AD | Admit: 2021-12-20 | Discharge: 2021-12-20 | Disposition: A | Discharge status: Home / Self Care | Payer: BC Managed Care – PPO | Attending: Obstetrics and Gynecology | Admitting: Obstetrics and Gynecology

## 2021-12-20 ENCOUNTER — Telehealth: Payer: Self-pay

## 2021-12-20 DIAGNOSIS — O99612 Diseases of the digestive system complicating pregnancy, second trimester: Secondary | ICD-10-CM | POA: Diagnosis not present

## 2021-12-20 DIAGNOSIS — K029 Dental caries, unspecified: Secondary | ICD-10-CM | POA: Insufficient documentation

## 2021-12-20 DIAGNOSIS — Z3A17 17 weeks gestation of pregnancy: Secondary | ICD-10-CM | POA: Insufficient documentation

## 2021-12-20 DIAGNOSIS — K0889 Other specified disorders of teeth and supporting structures: Secondary | ICD-10-CM | POA: Insufficient documentation

## 2021-12-20 MED ORDER — HYDROCODONE-ACETAMINOPHEN 7.5-325 MG/15ML PO SOLN: 15.0000 mL | Freq: Four times a day (QID) | ORAL | 0 refills | Status: DC | PRN

## 2021-12-20 MED ORDER — ACETAMINOPHEN-CODEINE 120-12 MG/5ML PO SOLN: 10.0000 mL | Freq: Four times a day (QID) | ORAL | 0 refills | Status: DC | PRN

## 2021-12-20 NOTE — MAU Provider Note (Signed)
History     CSN: [id number]  Arrival date and time: 12/20/21 1329   Event Date/Time   First Provider Initiated Contact with Patient 12/20/21 1355      Chief Complaint  Patient presents with   Dental Pain   Lisa Christian is a 31 y.o. G2P1001 at [redacted]w[redacted]d who receives care at [facility].  She presents today for Dental Pain.  She states she has been experiencing pain for the past month.  She states she has had frequent fillings fall out and was unable to go in.  She reports she received a letter for going ahead with dental work.  She reports she went to the dentist yesterday and was started on antibiotics.  She reports taking two pills today and yesterday.  She reports she was told to take tylenol for pain, but has not had relief.  She states that she has been able to put ice cold water on it and this causes relief. She reports the pain is worsened with heat.  She rates the pain a 10/10.  She denies vaginal concerns including bleeding, discharge, or leaking.      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   Headache    HNP (herniated nucleus pulposus), lumbar    Panic attacks    Pseudotumor cerebri    Regurgitation 11/24/2013   Vitamin D deficiency     Past Surgical History:  Procedure Laterality Date   BIOPSY N/A 01/11/2014   Procedure: BIOPSY;  Surgeon: West Bali, MD;  Location: [facility];  Service: Endoscopy;  Laterality: N/A;  duodenal and gastric   ESOPHAGOGASTRODUODENOSCOPY (EGD) WITH PROPOFOL N/A 01/11/2014   FXT:KWIOXBDZH/GDJM non-erosive   LUMBAR LAMINECTOMY/DECOMPRESSION MICRODISCECTOMY  Left 03/01/2019   Procedure: Microdiscectomy - Lumbar Four-Lumbar Five - left;  Surgeon: Donalee Citrin, MD;  Location: [facility] OR;  Service: Neurosurgery;  Laterality: Left;  Microdiscectomy - Lumbar Four-Lumbar Five - left   WISDOM TOOTH EXTRACTION      Family History  Problem Relation Age of Onset   Anxiety disorder Mother    Gallbladder disease Mother    Pancreatic cancer Maternal Uncle    Pancreatic cancer Maternal Uncle    Breast cancer Maternal Grandmother    Heart murmur Maternal Grandmother    Heart disease Maternal Grandfather    Heart disease Paternal Grandmother    Stroke Paternal Grandfather    Colon cancer Maternal Great-grandmother     Social History   Tobacco Use   Smoking status: Former    Types: Cigarettes    Passive exposure: Past   Smokeless tobacco: Never   Tobacco comments:    Some day smoker when having a drink once or twice a year for about 5 years  Vaping Use   Vaping Use: Never used  Substance Use Topics  Alcohol use: Not Currently    Comment: very rarely   Drug use: No    Allergies:  Allergies  Allergen Reactions   Latex     Vaginal irritation    Medications Prior to Admission  Medication Sig Dispense Refill Last Dose   Prenatal MV & Min w/FA-DHA (PRENATAL ADULT GUMMY/DHA/FA PO) Take by mouth. 2 gummies daily      promethazine (PHENERGAN) 25 MG tablet Take 1 tablet (25 mg total) by mouth every 6 (six) hours as needed for nausea or vomiting. (Patient not taking: Reported on 11/15/2021) 30 tablet 1    Pyridoxine HCl (VITAMIN B-6 PO) Take 2 tablets by mouth 2 (two) times daily.       Review of Systems  Constitutional:  Negative for chills and fever.  HENT:  Positive for dental problem (Right upper side) and ear pain (Right side).   Gastrointestinal:  Positive for constipation, nausea and vomiting. Negative for diarrhea. Blood in stool: Consistent t/o pregnancy. Genitourinary:  Negative for difficulty urinating, dysuria, vaginal bleeding and vaginal  discharge.  Neurological:  Negative for dizziness ("Fatigue feeling" d/t lack of sleep), light-headedness and headaches.   Physical Exam   Blood pressure 106/68, pulse 93, temperature 98.6 F (37 C), temperature source Oral, resp. rate 16, height 5\' 5"  (1.651 m), weight 75.2 kg, last menstrual period 08/18/2021, SpO2 98 %.  Physical Exam Vitals reviewed.  Constitutional:      Appearance: Normal appearance.  HENT:     Head: Normocephalic and atraumatic.     Mouth/Throat:     Dentition: Dental caries present. No dental abscesses.      Comments: Multiple Dental Caries-Filled Missing tooth noted. Eyes:     Conjunctiva/sclera: Conjunctivae normal.  Cardiovascular:     Rate and Rhythm: Normal rate and regular rhythm.  Pulmonary:     Effort: Pulmonary effort is normal. No respiratory distress.     Breath sounds: Normal breath sounds.  Abdominal:     Palpations: Abdomen is soft.  Musculoskeletal:        General: Normal range of motion.     Cervical back: Normal range of motion.  Skin:    General: Skin is warm and dry.  Neurological:     Mental Status: She is alert and oriented to person, place, and time.  Psychiatric:        Mood and Affect: Mood normal.        Behavior: Behavior normal.     MAU Course  Procedures No results found for this or any previous visit (from the past 24 hour(s)).  MDM Evaluation Prescription Assessment and Plan  31 year old Dental Pain  -Exam as above. -Instructed to continue medication as prescribed. -Patient requests liquid antibiotics (Amoxicillin) and informed that provider could send in prescription, but capsules have already been paid for and she can continue to open them up. -Discussed limited script for pain medication.  Patient agreeable. -Rx for Tylenol 3 suspension sent to pharmacy on file.  -Precautions given. -Discharged to home in stable condition.   38 12/20/2021, 1:55 PM

## 2021-12-20 NOTE — Telephone Encounter (Signed)
[facility] calls and states they do not have Tylenol 3 solution and recommend that prescription be sent to [facility].  Provider called and left message for new script.    Shanda Bumps, CNM

## 2021-12-20 NOTE — Progress Notes (Signed)
[facility] pharmacy calls and reports no Tylenol 3 in stock.  Provider contacts [facility] pharmacy who advises script for Hycet.  [place] pharmacists confirms availability, but limited supply.  Electronic script sent.

## 2021-12-20 NOTE — MAU Note (Signed)
Lisa Christian is a 31 y.o. at [redacted]w[redacted]d here in MAU reporting: has a bad tooth on the right upper side of her mouth. Has been to the dentist and has been started on abx. Was told to take tylenol.  Onset of complaint: ongoing  Pain score: 10/10  Vitals:   12/20/21 1353  BP: 106/68  Pulse: 93  Resp: 16  Temp: 98.6 F (37 C)  SpO2: 98%     FHT:158  Lab orders placed from triage: none

## 2022-01-07 ENCOUNTER — Ambulatory Visit (INDEPENDENT_AMBULATORY_CARE_PROVIDER_SITE_OTHER): Payer: BC Managed Care – PPO | Admitting: Women's Health

## 2022-01-07 ENCOUNTER — Encounter: Payer: Self-pay | Admitting: Women's Health

## 2022-01-07 ENCOUNTER — Other Ambulatory Visit (HOSPITAL_COMMUNITY)
Admission: RE | Admit: 2022-01-07 | Discharge: 2022-01-07 | Disposition: A | Discharge status: Home / Self Care | Payer: BC Managed Care – PPO | Source: Ambulatory Visit | Attending: Women's Health | Admitting: Women's Health

## 2022-01-07 ENCOUNTER — Ambulatory Visit (INDEPENDENT_AMBULATORY_CARE_PROVIDER_SITE_OTHER): Payer: BC Managed Care – PPO

## 2022-01-07 VITALS — BP 110/64 | HR 93 | Wt 169.2 lb

## 2022-01-07 DIAGNOSIS — N898 Other specified noninflammatory disorders of vagina: Secondary | ICD-10-CM

## 2022-01-07 DIAGNOSIS — Z363 Encounter for antenatal screening for malformations: Secondary | ICD-10-CM

## 2022-01-07 DIAGNOSIS — Z3480 Encounter for supervision of other normal pregnancy, unspecified trimester: Secondary | ICD-10-CM

## 2022-01-07 DIAGNOSIS — Z3482 Encounter for supervision of other normal pregnancy, second trimester: Secondary | ICD-10-CM | POA: Diagnosis not present

## 2022-01-07 DIAGNOSIS — Z3A20 20 weeks gestation of pregnancy: Secondary | ICD-10-CM | POA: Diagnosis not present

## 2022-01-07 NOTE — Progress Notes (Signed)
[place] 20+2 wks,breech,cx 5 cm,SVP of fluid 7.1 cm,anterior placenta gr 0,normal ovaries,FHR 157 bpm,EFW 528 g 99%,anatomy complete

## 2022-01-07 NOTE — Progress Notes (Signed)
LOW-RISK PREGNANCY VISIT Patient name: Lisa Christian MRN [id number]  Date of birth: [birth year] Chief Complaint:   Routine Prenatal Visit (Vaginal itching and discharge since taking antibiotic for tooth pain )  History of Present Illness:   Lisa Christian is a 31 y.o. G53P1001 female at [redacted]w[redacted]d with an Estimated Date of Delivery: 05/25/22 being seen today for ongoing management of a low-risk pregnancy.   Today she reports  vulvovaginal itching. Just finished antibiotic for dental infection (having pulled 12/27) . Has appt w/ neurologist who did her L4-L5 laminectomy. Concerned about progression of pain during pregnancy, ability to get epidural, etc.   Contractions: Not present. Vag. Bleeding: None.  Movement: Present. denies leaking of fluid.     11/15/2021   11:04 AM 05/04/2021   10:48 AM 02/02/2018    2:46 PM  Depression screen PHQ 2/9  Decreased Interest 0 0 0  Down, Depressed, Hopeless 0 0 0  PHQ - 2 Score 0 0 0  Altered sleeping 0 1   Tired, decreased energy 0 0   Change in appetite 0 0   Feeling bad or failure about yourself  0 0   Trouble concentrating 0 0   Moving slowly or fidgety/restless 0 0   Suicidal thoughts 0 0   PHQ-9 Score 0 1         11/15/2021   11:04 AM 05/04/2021   10:49 AM  GAD 7 : Generalized Anxiety Score  Nervous, Anxious, on Edge 0 0  Control/stop worrying 0 0  Worry too much - different things 0 0  Trouble relaxing 1 0  Restless 0 0  Easily annoyed or irritable 0 0  Afraid - awful might happen 0 0  Total GAD 7 Score 1 0      Review of Systems:   Pertinent items are noted in HPI Denies abnormal vaginal discharge w/ itching/odor/irritation, headaches, visual changes, shortness of breath, chest pain, abdominal pain, severe nausea/vomiting, or problems with urination or bowel movements unless otherwise stated above. Pertinent History Reviewed:  Reviewed past medical,surgical, social, obstetrical and family history.  Reviewed problem list,  medications and allergies. Physical Assessment:   Vitals:   01/07/22 1511  BP: 110/64  Pulse: 93  Weight: 169 lb 4 oz (76.8 kg)  Body mass index is 28.16 kg/m.        Physical Examination:   General appearance: Well appearing, and in no distress  Mental status: Alert, oriented to person, place, and time  Skin: Warm & dry  Cardiovascular: Normal heart rate noted  Respiratory: Normal respiratory effort, no distress  Abdomen: Soft, gravid, nontender  Pelvic:  spec exam: cx visually closed, small amt white d/c, no odor          Extremities: Edema: None  Fetal Status:     Movement: Present  [place] 20+2 wks,breech,cx 5 cm,SVP of fluid 7.1 cm,anterior placenta gr 0,normal ovaries,FHR 157 bpm,EFW 528 g 99%,anatomy complete   Chaperone: [employer]   No results found for this or any previous visit (from the past 24 hour(s)).  Assessment & Plan:  1) Low-risk pregnancy G2P1001 at [redacted]w[redacted]d with an Estimated Date of Delivery: 05/25/22   2) Vulvovaginal itching, s/p antibiotic use, exam not evident for yeast, CV swab sent  3) S/P L4-L5 laminectomy 2021- has appt w/ neuro coming up. Note routed to Dr. Darlin Drop anesthesia on call today who states this will not affect her ability to get an epidural or increase her risk.  4) H/O  shoulder dystocia> 9lb0.4oz, mild, no deficits  5) Suspected macrosomia> EFW 99% today, will repeat at 36wks   Meds: No orders of the defined types were placed in this encounter.  Labs/procedures today: spec exam, CV swab, and declined flu shot  Plan:  Continue routine obstetrical care  Next visit: prefers online    Reviewed: Preterm labor symptoms and general obstetric precautions including but not limited to vaginal bleeding, contractions, leaking of fluid and fetal movement were reviewed in detail with the patient.  All questions were answered. Does not have home bp cuff. Office bp cuff given: yes. Check bp weekly, let us know if consistently >140 and/or  >90.  Follow-up: Return in about 4 weeks (around 02/04/2022) for LROB, CNM, MyChart Video.  Future Appointments  Date Time Provider [place]  02/04/2022 11:50 AM Roma Schanz, CNM [facility]    No orders of the defined types were placed in this encounter.  [place], [facility] 01/07/2022 3:55 PM

## 2022-01-07 NOTE — Patient Instructions (Signed)
Lisa Christian, thank you for choosing our office today! We appreciate the opportunity to meet your healthcare needs. You may receive a short survey by mail, e-mail, or through MyChart. If you are happy with your care we would appreciate if you could take just a few minutes to complete the survey questions. We read all of your comments and take your feedback very seriously. Thank you again for choosing our office.  [facility] Team at [facility] ([address] [place], [place] [address]) Entrance C, located off of [address] Free 24/7 valet parking  Go to [email or web address] to register for FREE online childbirth classes  Call the office ([phone]) or go to [facility] if: You begin to severe cramping Your water breaks.  Sometimes it is a big gush of fluid, sometimes it is just a trickle that keeps getting your panties wet or running down your legs You have vaginal bleeding.  It is normal to have a small amount of spotting if your cervix was checked.   [place] Pediatricians/Family Doctors [facility] ([facility]): [address], [phone]           [facility]: [address], [phone]                [facility] ([facility]): [address], [phone] (call to ask if accepting patients) [facility]: [address], [place], [phone]    [place] Pediatricians/Family Doctors [facility] ([facility]): [address], [phone] [facility]: [address], [phone] [facility]: [address], [phone]  [place] [facility]  [facility] ([facility]): [phone] [facility]: [address], [phone]   [place] Family Doctors [facility]: [address], [phone]  [place] [facility]  [facility]: [address], [phone]  Home Blood Pressure Monitoring for Patients   Your provider has recommended that you check your blood pressure (BP) at least once a week at home. If you do not have a blood pressure cuff at home, one will be provided for you. Contact your provider if you have not received your monitor within 1 week.   Helpful Tips for Accurate Home Blood Pressure Checks  Don't smoke, exercise, or drink caffeine 30 minutes before checking your BP Use the restroom before checking your BP (a full bladder can raise your pressure) Relax in a comfortable upright chair Feet on the ground Left arm resting comfortably on a flat surface at the level of your heart Legs uncrossed Back supported Sit quietly and don't talk Place the cuff on your bare arm Adjust snuggly, so that only two fingertips can fit between your skin and the top of the cuff Check 2 readings separated by at least one minute Keep a log of your BP readings For a visual, please reference this diagram: http://[facility].care/bpdiagram  Provider Name: [facility]     Phone: [phone]  Zone 1: ALL CLEAR  Continue to monitor your symptoms:  BP reading is less than 140 (top number) or less than 90 (bottom number)  No right upper stomach pain No headaches or seeing spots No feeling nauseated or throwing up No swelling in face and hands  Zone 2: CAUTION Call your doctor's office for any of the following:  BP reading is greater than 140 (top number) or greater than   90 (bottom number)  Stomach pain under your ribs in the middle or right side Headaches or seeing spots Feeling nauseated or throwing up Swelling in face and hands  Zone 3: EMERGENCY  Seek immediate medical care if you have any of the following:  BP reading is greater than160 (top number) or greater than 110 (bottom number) Severe headaches not improving with Tylenol Serious difficulty catching your breath Any worsening symptoms from  Zone 2     Second Trimester of Pregnancy The second trimester is from week 14 through week 27 (months 4 through 6). The second trimester is often a time when you feel your best. Your body has adjusted to being pregnant, and you begin to feel better physically. Usually, morning sickness has lessened or quit completely, you may have more energy, and you may have an increase in appetite. The second trimester is also a time when the fetus is growing rapidly. At the end of the sixth month, the fetus is about 9 inches long and weighs about 1 pounds. You will likely begin to feel the baby move (quickening) between 16 and 20 weeks of pregnancy. Body changes during your second trimester Your body continues to go through many changes during your second trimester. The changes vary from woman to woman. Your weight will continue to increase. You will notice your lower abdomen bulging out. You may begin to get stretch marks on your hips, abdomen, and breasts. You may develop headaches that can be relieved by medicines. The medicines should be approved by your health care provider. You may urinate more often because the fetus is pressing on your bladder. You may develop or continue to have heartburn as a result of your pregnancy. You may develop constipation because certain hormones are causing the muscles that push waste through your intestines to slow down. You may develop hemorrhoids or swollen, bulging veins (varicose veins). You may have back pain. This is caused by: Weight gain. Pregnancy hormones that are relaxing the joints in your pelvis. A shift in weight and the muscles that support your balance. Your breasts will continue to grow and they will continue to become tender. Your gums may bleed and may be sensitive to brushing and flossing. Dark spots or blotches (chloasma, mask of pregnancy) may develop on your face. This will likely fade after the baby is born. A dark line from your belly button to  the pubic area (linea nigra) may appear. This will likely fade after the baby is born. You may have changes in your hair. These can include thickening of your hair, rapid growth, and changes in texture. Some women also have hair loss during or after pregnancy, or hair that feels dry or thin. Your hair will most likely return to normal after your baby is born.  What to expect at prenatal visits During a routine prenatal visit: You will be weighed to make sure you and the fetus are growing normally. Your blood pressure will be taken. Your abdomen will be measured to track your baby's growth. The fetal heartbeat will be listened to. Any test results from the previous visit will be discussed.  Your health care provider may ask you: How you are feeling. If you are feeling the baby move. If you have had any abnormal symptoms, such as leaking fluid, bleeding, severe headaches, or abdominal cramping. If you are using any tobacco products, including cigarettes, chewing tobacco, and electronic cigarettes. If you have any questions.  Other tests that may be performed during   your second trimester include: Blood tests that check for: Low iron levels (anemia). High blood sugar that affects pregnant women (gestational diabetes) between 24 and 28 weeks. Rh antibodies. This is to check for a protein on red blood cells (Rh factor). Urine tests to check for infections, diabetes, or protein in the urine. An ultrasound to confirm the proper growth and development of the baby. An amniocentesis to check for possible genetic problems. Fetal screens for spina bifida and Down syndrome. HIV (human immunodeficiency virus) testing. Routine prenatal testing includes screening for HIV, unless you choose not to have this test.  Follow these instructions at home: Medicines Follow your health care provider's instructions regarding medicine use. Specific medicines may be either safe or unsafe to take during  pregnancy. Take a prenatal vitamin that contains at least 600 micrograms (mcg) of folic acid. If you develop constipation, try taking a stool softener if your health care provider approves. Eating and drinking Eat a balanced diet that includes fresh fruits and vegetables, whole grains, good sources of protein such as meat, eggs, or tofu, and low-fat dairy. Your health care provider will help you determine the amount of weight gain that is right for you. Avoid raw meat and uncooked cheese. These carry germs that can cause birth defects in the baby. If you have low calcium intake from food, talk to your health care provider about whether you should take a daily calcium supplement. Limit foods that are high in fat and processed sugars, such as fried and sweet foods. To prevent constipation: Drink enough fluid to keep your urine clear or pale yellow. Eat foods that are high in fiber, such as fresh fruits and vegetables, whole grains, and beans. Activity Exercise only as directed by your health care provider. Most women can continue their usual exercise routine during pregnancy. Try to exercise for 30 minutes at least 5 days a week. Stop exercising if you experience uterine contractions. Avoid heavy lifting, wear low heel shoes, and practice good posture. A sexual relationship may be continued unless your health care provider directs you otherwise. Relieving pain and discomfort Wear a good support bra to prevent discomfort from breast tenderness. Take warm sitz baths to soothe any pain or discomfort caused by hemorrhoids. Use hemorrhoid cream if your health care provider approves. Rest with your legs elevated if you have leg cramps or low back pain. If you develop varicose veins, wear support hose. Elevate your feet for 15 minutes, 3-4 times a day. Limit salt in your diet. Prenatal Care Write down your questions. Take them to your prenatal visits. Keep all your prenatal visits as told by your health  care provider. This is important. Safety Wear your seat belt at all times when driving. Make a list of emergency phone numbers, including numbers for family, friends, the hospital, and police and fire departments. General instructions Ask your health care provider for a referral to a local prenatal education class. Begin classes no later than the beginning of month 6 of your pregnancy. Ask for help if you have counseling or nutritional needs during pregnancy. Your health care provider can offer advice or refer you to specialists for help with various needs. Do not use hot tubs, steam rooms, or saunas. Do not douche or use tampons or scented sanitary pads. Do not cross your legs for long periods of time. Avoid cat litter boxes and soil used by cats. These carry germs that can cause birth defects in the baby and possibly loss of the   fetus by miscarriage or stillbirth. Avoid all smoking, herbs, alcohol, and unprescribed drugs. Chemicals in these products can affect the formation and growth of the baby. Do not use any products that contain nicotine or tobacco, such as cigarettes and e-cigarettes. If you need help quitting, ask your health care provider. Visit your dentist if you have not gone yet during your pregnancy. Use a soft toothbrush to brush your teeth and be gentle when you floss. Contact a health care provider if: You have dizziness. You have mild pelvic cramps, pelvic pressure, or nagging pain in the abdominal area. You have persistent nausea, vomiting, or diarrhea. You have a bad smelling vaginal discharge. You have pain when you urinate. Get help right away if: You have a fever. You are leaking fluid from your vagina. You have spotting or bleeding from your vagina. You have severe abdominal cramping or pain. You have rapid weight gain or weight loss. You have shortness of breath with chest pain. You notice sudden or extreme swelling of your face, hands, ankles, feet, or legs. You  have not felt your baby move in over an hour. You have severe headaches that do not go away when you take medicine. You have vision changes. Summary The second trimester is from week 14 through week 27 (months 4 through 6). It is also a time when the fetus is growing rapidly. Your body goes through many changes during pregnancy. The changes vary from woman to woman. Avoid all smoking, herbs, alcohol, and unprescribed drugs. These chemicals affect the formation and growth your baby. Do not use any tobacco products, such as cigarettes, chewing tobacco, and e-cigarettes. If you need help quitting, ask your health care provider. Contact your health care provider if you have any questions. Keep all prenatal visits as told by your health care provider. This is important. This information is not intended to replace advice given to you by your health care provider. Make sure you discuss any questions you have with your health care provider. Document Released: 01/01/2001 Document Revised: 06/15/2015 Document Reviewed: 03/10/2012 Elsevier Interactive Patient Education  2017 [employer]  

## 2022-01-09 LAB — CERVICOVAGINAL ANCILLARY ONLY
Bacterial Vaginitis (gardnerella): NEGATIVE
Candida Glabrata: NEGATIVE
Candida Vaginitis: NEGATIVE
Chlamydia: NEGATIVE
Comment: NEGATIVE
Comment: NEGATIVE
Comment: NEGATIVE
Comment: NEGATIVE
Comment: NEGATIVE
Comment: NORMAL
Neisseria Gonorrhea: NEGATIVE
Trichomonas: NEGATIVE

## 2022-02-01 ENCOUNTER — Inpatient Hospital Stay (HOSPITAL_COMMUNITY)
Admission: AD | Admit: 2022-02-01 | Discharge: 2022-02-01 | Disposition: A | Discharge status: Home / Self Care | Payer: BLUE CROSS/BLUE SHIELD | Attending: Obstetrics & Gynecology | Admitting: Obstetrics & Gynecology

## 2022-02-01 ENCOUNTER — Encounter (HOSPITAL_COMMUNITY): Payer: Self-pay | Admitting: Obstetrics & Gynecology

## 2022-02-01 DIAGNOSIS — Z3A23 23 weeks gestation of pregnancy: Secondary | ICD-10-CM | POA: Insufficient documentation

## 2022-02-01 DIAGNOSIS — O99612 Diseases of the digestive system complicating pregnancy, second trimester: Secondary | ICD-10-CM | POA: Insufficient documentation

## 2022-02-01 DIAGNOSIS — R509 Fever, unspecified: Secondary | ICD-10-CM | POA: Diagnosis not present

## 2022-02-01 DIAGNOSIS — R112 Nausea with vomiting, unspecified: Secondary | ICD-10-CM | POA: Diagnosis not present

## 2022-02-01 DIAGNOSIS — E876 Hypokalemia: Secondary | ICD-10-CM | POA: Diagnosis not present

## 2022-02-01 DIAGNOSIS — K219 Gastro-esophageal reflux disease without esophagitis: Secondary | ICD-10-CM | POA: Diagnosis not present

## 2022-02-01 DIAGNOSIS — R197 Diarrhea, unspecified: Secondary | ICD-10-CM | POA: Diagnosis not present

## 2022-02-01 DIAGNOSIS — O26892 Other specified pregnancy related conditions, second trimester: Secondary | ICD-10-CM | POA: Diagnosis not present

## 2022-02-01 DIAGNOSIS — R109 Unspecified abdominal pain: Secondary | ICD-10-CM | POA: Diagnosis not present

## 2022-02-01 DIAGNOSIS — O99282 Endocrine, nutritional and metabolic diseases complicating pregnancy, second trimester: Secondary | ICD-10-CM | POA: Insufficient documentation

## 2022-02-01 DIAGNOSIS — Z87891 Personal history of nicotine dependence: Secondary | ICD-10-CM | POA: Diagnosis not present

## 2022-02-01 DIAGNOSIS — A084 Viral intestinal infection, unspecified: Secondary | ICD-10-CM

## 2022-02-01 LAB — COMPREHENSIVE METABOLIC PANEL
ALT: 11 U/L (ref 0–44)
AST: 18 U/L (ref 15–41)
Albumin: 2.8 g/dL — ABNORMAL LOW (ref 3.5–5.0)
Alkaline Phosphatase: 48 U/L (ref 38–126)
Anion gap: 10 (ref 5–15)
BUN: <5 mg/dL — ABNORMAL LOW (ref 6–20)
CO2: 19 mmol/L — ABNORMAL LOW (ref 22–32)
Calcium: 8.1 mg/dL — ABNORMAL LOW (ref 8.9–10.3)
Chloride: 104 mmol/L (ref 98–111)
Creatinine, Ser: 0.55 mg/dL (ref 0.44–1.00)
GFR, Estimated: >60 mL/min (ref >60)
Glucose, Bld: 105 mg/dL — ABNORMAL HIGH (ref 70–99)
Potassium: 3.2 mmol/L — ABNORMAL LOW (ref 3.5–5.1)
Sodium: 133 mmol/L — ABNORMAL LOW (ref 135–145)
Total Bilirubin: 1 mg/dL (ref 0.3–1.2)
Total Protein: 5.7 g/dL — ABNORMAL LOW (ref 6.5–8.1)

## 2022-02-01 LAB — URINALYSIS, ROUTINE W REFLEX MICROSCOPIC
Bilirubin Urine: NEGATIVE
Glucose, UA: NEGATIVE mg/dL
Hgb urine dipstick: NEGATIVE
Ketones, ur: NEGATIVE mg/dL
Leukocytes,Ua: NEGATIVE
Nitrite: NEGATIVE
Protein, ur: 30 mg/dL — AB
Specific Gravity, Urine: 1.03 (ref 1.005–1.030)
pH: 5 (ref 5.0–8.0)

## 2022-02-01 LAB — CBC
HCT: 31.1 % — ABNORMAL LOW (ref 36.0–46.0)
Hemoglobin: 10.7 g/dL — ABNORMAL LOW (ref 12.0–15.0)
MCH: 33.2 pg (ref 26.0–34.0)
MCHC: 34.4 g/dL (ref 30.0–36.0)
MCV: 96.6 fL (ref 80.0–100.0)
Platelets: 196 10*3/uL (ref 150–400)
RBC: 3.22 MIL/uL — ABNORMAL LOW (ref 3.87–5.11)
RDW: 12 % (ref 11.5–15.5)
WBC: 10.6 10*3/uL — ABNORMAL HIGH (ref 4.0–10.5)
nRBC: 0 % (ref 0.0–0.2)

## 2022-02-01 MED ORDER — LACTATED RINGERS IV SOLN: Freq: Once | INTRAVENOUS | Status: AC

## 2022-02-01 MED ORDER — POTASSIUM CHLORIDE ER 10 MEQ PO TBCR: 10.0000 meq | EXTENDED_RELEASE_TABLET | Freq: Two times a day (BID) | ORAL | 0 refills | Status: DC

## 2022-02-01 MED ORDER — LOPERAMIDE HCL 2 MG PO CAPS
2.0000 mg | ORAL_CAPSULE | Freq: Once | ORAL | Status: AC
  Administered 2022-02-01: 2 mg via ORAL
  Filled 2022-02-01: qty 1

## 2022-02-01 MED ORDER — ONDANSETRON 4 MG PO TBDP: 4.0000 mg | ORAL_TABLET | Freq: Four times a day (QID) | ORAL | 0 refills | Status: DC | PRN

## 2022-02-01 MED ORDER — ONDANSETRON HCL 4 MG/2ML IJ SOLN
4.0000 mg | Freq: Once | INTRAMUSCULAR | Status: AC
  Administered 2022-02-01: 4 mg via INTRAVENOUS
  Filled 2022-02-01: qty 2

## 2022-02-01 NOTE — MAU Note (Signed)
.  Lisa Christian is a 32 y.o. at [redacted]w[redacted]d here in MAU reporting: nausea, vomiting and diarrhea starting this 1000-1030 01/31/22. Patient reports she has vomitted 4x in the past 24 hours, and has had 10-12 instances of diarrhea. Patient reports temperature taken at home 100.8 F. Patient denies VB, LOF and DFM.    PNC at [place]  Onset of complaint: 01/31/22 Pain score: no denies pain  Vitals:   02/01/22 0554  BP: 103/71  Pulse: (!) 104  Resp: 18  Temp: 98.7 F (37.1 C)  SpO2: 97%     FHT:149 Lab orders placed from triage:  UA

## 2022-02-01 NOTE — MAU Provider Note (Signed)
Chief Complaint:  Nausea, Emesis, and Diarrhea   Event Date/Time   First Provider Initiated Contact with Patient 02/01/22 [phone]     HPI: Angeleah Labrake is a 32 y.o. G2P1001 at 41w6dwho presents to maternity admissions reporting nausea, vomiting and diarrhea since yesterday morning. Had some fever and chills at home. She reports good fetal movement, denies LOF, vaginal bleeding, urinary symptoms, h/a, dizziness.    Emesis  This is a new problem. The current episode started yesterday. The problem occurs 2 to 4 times per day. The problem has been unchanged. The maximum temperature recorded prior to her arrival was 100.4 - 100.9 F. Associated symptoms include abdominal pain (cramps with vomiting), chills, diarrhea and a fever. Pertinent negatives include no headaches.  Diarrhea  This is a new problem. The current episode started yesterday. The problem occurs more than 10 times per day. The problem has been unchanged. Associated symptoms include abdominal pain (cramps with vomiting), chills, a fever and vomiting. Pertinent negatives include no headaches. Nothing aggravates the symptoms. There are no known risk factors. She has tried nothing for the symptoms.   RN Note: Halona Amstutz is a 32 y.o. at [redacted]w[redacted]d here in MAU reporting: nausea, vomiting and diarrhea starting this 1000-1030 01/31/22. Patient reports she has vomitted 4x in the past 24 hours, and has had 10-12 instances of diarrhea. Patient reports temperature taken at home 100.8 F. Patient denies VB, LOF and DFM.    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   Headache    HNP (herniated nucleus pulposus), lumbar    Panic attacks    Pseudotumor cerebri    Regurgitation 11/24/2013   Vitamin D deficiency      Past obstetric history: OB History  Gravida Para Term Preterm AB Living  2 1 1     1   SAB IAB Ectopic Multiple Live Births        0 1    # Outcome Date GA Lbr Len/2nd Weight Sex Delivery Anes PTL Lv  2 Current           1 Term 01/10/15 [redacted]w[redacted]d 11:58 / 01:41 4094 g F Vag-Spont EPI  LIV     Complications: Shoulder Dystocia    Past Surgical History: Past Surgical History:  Procedure Laterality Date   BIOPSY N/A 01/11/2014   Procedure: BIOPSY;  Surgeon: Danie Binder, MD;  Location: [facility];  Service: Endoscopy;  Laterality: N/A;  duodenal and gastric   ESOPHAGOGASTRODUODENOSCOPY (EGD) WITH PROPOFOL N/A 01/11/2014   KYH:CWCBJSEGB/TDVV non-erosive   LUMBAR LAMINECTOMY/DECOMPRESSION MICRODISCECTOMY Left 03/01/2019   Procedure: Microdiscectomy - Lumbar Four-Lumbar Five - left;  Surgeon: Kary Kos, MD;  Location: [facility];  Service: Neurosurgery;  Laterality: Left;  Microdiscectomy - Lumbar Four-Lumbar Five - left   WISDOM TOOTH EXTRACTION      Family History: Family History  Problem Relation Age of Onset   Anxiety disorder Mother    Gallbladder disease Mother    Pancreatic cancer Maternal Uncle    Pancreatic cancer Maternal Uncle    Breast cancer Maternal Grandmother    Heart murmur Maternal Grandmother    Heart disease Maternal Grandfather    Heart disease Paternal Grandmother    Stroke Paternal Grandfather  Colon cancer Maternal Great-grandmother     Social History: Social History   Tobacco Use   Smoking status: Former    Types: Cigarettes    Passive exposure: Past   Smokeless tobacco: Never   Tobacco comments:    Some day smoker when having a drink once or twice a year for about 5 years  Vaping Use   Vaping Use: Never used  Substance Use Topics   Alcohol use: Not Currently    Comment: very rarely   Drug use: No    Allergies:  Allergies  Allergen Reactions   Latex     Vaginal irritation    Meds:  Medications Prior to Admission  Medication Sig Dispense  Refill Last Dose   Prenatal MV & Min w/FA-DHA (PRENATAL ADULT GUMMY/DHA/FA PO) Take by mouth. 2 gummies daily   01/31/2022   HYDROcodone-acetaminophen (HYCET) 7.5-325 mg/15 ml solution Take 15 mLs by mouth every 6 (six) hours as needed for moderate pain. (Patient not taking: Reported on 01/07/2022) 60 mL 0    Pyridoxine HCl (VITAMIN B-6 PO) Take 2 tablets by mouth 2 (two) times daily.       I have reviewed patient's Past Medical Hx, Surgical Hx, Family Hx, Social Hx, medications and allergies.   ROS:  Review of Systems  Constitutional:  Positive for chills and fever.  HENT:  Negative for congestion and sore throat.   Respiratory:  Negative for shortness of breath.   Gastrointestinal:  Positive for abdominal pain (cramps with vomiting), diarrhea, nausea and vomiting.  Genitourinary:  Negative for dysuria.  Neurological:  Negative for headaches.   Other systems negative  Physical Exam  Patient Vitals for the past 24 hrs:  BP Temp Temp src Pulse Resp SpO2  02/01/22 0554 103/71 98.7 F (37.1 C) Oral (!) 104 18 97 %   Constitutional: Well-developed, well-nourished female in no acute distress.  Cardiovascular: normal rate and rhythm Respiratory: normal effort GI: Abd soft, non-tender, gravid appropriate for gestational age.   No rebound or guarding. MS: Extremities nontender, no edema, normal ROM Neurologic: Alert and oriented x 4.  GU: Neg CVAT.  FHT:  Baseline 145 , moderate variability, accelerations present, no decelerations Contractions: some irritability   Labs: Results for orders placed or performed during the hospital encounter of 02/01/22 (from the past 24 hour(s))  Urinalysis, Routine w reflex microscopic     Status: Abnormal   Collection Time: 02/01/22  5:44 AM  Result Value Ref Range   Color, Urine AMBER (A) YELLOW   APPearance HAZY (A) CLEAR   Specific Gravity, Urine 1.030 1.005 - 1.030   pH 5.0 5.0 - 8.0   Glucose, UA NEGATIVE NEGATIVE mg/dL   Hgb urine dipstick  NEGATIVE NEGATIVE   Bilirubin Urine NEGATIVE NEGATIVE   Ketones, ur NEGATIVE NEGATIVE mg/dL   Protein, ur 30 (A) NEGATIVE mg/dL   Nitrite NEGATIVE NEGATIVE   Leukocytes,Ua NEGATIVE NEGATIVE   RBC / HPF 0-5 0 - 5 RBC/hpf   WBC, UA 0-5 0 - 5 WBC/hpf   Bacteria, UA FEW (A) NONE SEEN   Squamous Epithelial / HPF 11-20 0 - 5 /HPF   Mucus PRESENT   CBC     Status: Abnormal   Collection Time: 02/01/22  6:24 AM  Result Value Ref Range   WBC 10.6 (H) 4.0 - 10.5 K/uL   RBC 3.22 (L) 3.87 - 5.11 MIL/uL   Hemoglobin 10.7 (L) 12.0 - 15.0 g/dL   HCT 31.1 (L) 36.0 - 46.0 %  MCV 96.6 80.0 - 100.0 fL   MCH 33.2 26.0 - 34.0 pg   MCHC 34.4 30.0 - 36.0 g/dL   RDW 96.7 89.3 - 81.0 %   Platelets 196 150 - 400 K/uL   nRBC 0.0 0.0 - 0.2 %  Comprehensive metabolic panel     Status: Abnormal   Collection Time: 02/01/22  6:24 AM  Result Value Ref Range   Sodium 133 (L) 135 - 145 mmol/L   Potassium 3.2 (L) 3.5 - 5.1 mmol/L   Chloride 104 98 - 111 mmol/L   CO2 19 (L) 22 - 32 mmol/L   Glucose, Bld 105 (H) 70 - 99 mg/dL   BUN <5 (L) 6 - 20 mg/dL   Creatinine, Ser 1.75 0.44 - 1.00 mg/dL   Calcium 8.1 (L) 8.9 - 10.3 mg/dL   Total Protein 5.7 (L) 6.5 - 8.1 g/dL   Albumin 2.8 (L) 3.5 - 5.0 g/dL   AST 18 15 - 41 U/L   ALT 11 0 - 44 U/L   Alkaline Phosphatase 48 38 - 126 U/L   Total Bilirubin 1.0 0.3 - 1.2 mg/dL   GFR, Estimated >10 >25 mL/min   Anion gap 10 5 - 15    B/Positive/-- (10/26 1402)  Imaging:    MAU Course/MDM: I have reviewed the triage vital signs and the nursing notes.   Pertinent labs & imaging results that were available during my care of the patient were reviewed by me and considered in my medical decision making (see chart for details).      I have reviewed her medical records including past results, notes and treatments.   I have ordered labs and reviewed results.  NST reviewed, reassuring for gestational age   Treatments in MAU included IV fluids x 2 liters, zofran for  nausea, one dose of Immodium.    Assessment: Single IUP @[redacted]w[redacted]d  Nausea and vomiting Diarrhea Likely viral gastroenteritis Mild hypokalemia  Plan: Discharge home Rx Zofran ODT for nausea Rx K-Dur x 3 doses for repletion of potassium Advance diet as tolerated Precautions for return reviewed Followup in office  Encouraged to return if she develops worsening of symptoms, increase in pain, fever, or other concerning symptoms.     CNM, MSN Certified Nurse-Midwife 02/01/2022 6:11 AM

## 2022-02-04 ENCOUNTER — Encounter: Payer: Self-pay | Admitting: *Deleted

## 2022-02-04 ENCOUNTER — Telehealth: Payer: BLUE CROSS/BLUE SHIELD | Admitting: Women's Health

## 2022-02-04 ENCOUNTER — Encounter: Payer: Self-pay | Admitting: Women's Health

## 2022-02-04 VITALS — BP 107/62 | HR 72 | Wt 169.0 lb

## 2022-02-04 DIAGNOSIS — Z3482 Encounter for supervision of other normal pregnancy, second trimester: Secondary | ICD-10-CM

## 2022-02-04 DIAGNOSIS — Z3480 Encounter for supervision of other normal pregnancy, unspecified trimester: Secondary | ICD-10-CM

## 2022-02-04 NOTE — Patient Instructions (Signed)
Lisa Christian, thank you for choosing our office today! We appreciate the opportunity to meet your healthcare needs. You may receive a short survey by mail, e-mail, or through [employer]. If you are happy with your care we would appreciate if you could take just a few minutes to complete the survey questions. We read all of your comments and take your feedback very seriously. Thank you again for choosing our office.  Center for [employer] Team at [place] at [facility] ([place], [place] [id number]) Entrance C, located off of [place] parking   You will have your sugar test next visit.  Please do not eat or drink anything after midnight the night before you come, not even water.  You will be here for at least two hours.  Please make an appointment online for the bloodwork at [email or web address] for 8:00am (or as close to this as possible). Make sure you select the [facility] service center.   CLASSES: Go to [email or web address] to register for classes (childbirth, breastfeeding, waterbirth, infant CPR, daddy bootcamp, etc.)  Call the office [phone]) or go to [facility] if: You begin to have strong, frequent contractions Your water breaks.  Sometimes it is a big gush of fluid, sometimes it is just a trickle that keeps getting your panties wet or running down your legs You have vaginal bleeding.  It is normal to have a small amount of spotting if your cervix was checked.  You don't feel your baby moving like normal.  If you don't, get you something to eat and drink and lay down and focus on feeling your baby move.   If your baby is still not moving like normal, you should call the office or go to [facility].  Call the office [phone]) or go to [facility] hospital for these signs of pre-eclampsia: Severe headache that does not go away with Tylenol Visual changes- seeing spots, double, blurred vision Pain under your right breast or upper  abdomen that does not go away with Tums or heartburn medicine Nausea and/or vomiting Severe swelling in your hands, feet, and face    [facility] Pediatricians/Family Doctors [facility]): [address] [place], [place]: [address] [place], [facility]): [place], [phone] (call to ask if accepting patients) [facility] Department: [address] [place], [place] [facility]): [address], [place] [facility]: [address] [place], [place] [facility] [place]: [place], [facility]): [phone] [facility]: [address], [place]: [address] [place] [facility]: [phone], [phone]  Home Blood Pressure Monitoring for Patients   Your provider has recommended that you check your blood pressure (BP) at least once a week at home. If you do not have a blood pressure cuff at home, one will be provided for you. Contact your provider if you have not received your monitor within 1 week.   Helpful Tips for Accurate Home Blood Pressure Checks  Don't smoke, exercise, or drink  caffeine 30 minutes before checking your BP Use the restroom before checking your BP (a full bladder can raise your pressure) Relax in a comfortable upright chair Feet on the ground Left arm resting comfortably on a flat surface at the level of your heart Legs uncrossed Back supported Sit quietly and don't talk Place the cuff on your bare arm Adjust snuggly, so that only two fingertips can fit between your skin and the top of the cuff Check 2  readings separated by at least one minute Keep a log of your BP readings For a visual, please reference this diagram: http://ccnc.care/bpdiagram  Provider Name: [facility]     Phone: [phone]  Zone 1: ALL CLEAR  Continue to monitor your symptoms:  BP reading is less than 140 (top number) or less than 90 (bottom number)  No right upper stomach pain No headaches or seeing spots No feeling nauseated or throwing up No swelling in face and hands  Zone 2: CAUTION Call your doctor's office for any of the following:  BP reading is greater than 140 (top number) or greater than 90 (bottom number)  Stomach pain under your ribs in the middle or right side Headaches or seeing spots Feeling nauseated or throwing up Swelling in face and hands  Zone 3: EMERGENCY  Seek immediate medical care if you have any of the following:  BP reading is greater than160 (top number) or greater than 110 (bottom number) Severe headaches not improving with Tylenol Serious difficulty catching your breath Any worsening symptoms from Zone 2   Second Trimester of Pregnancy The second trimester is from week 13 through week 28, months 4 through 6. The second trimester is often a time when you feel your best. Your body has also adjusted to being pregnant, and you begin to feel better physically. Usually, morning sickness has lessened or quit completely, you may have more energy, and you may have an increase in appetite. The second trimester is also a time when the fetus is growing rapidly. At the end of the sixth month, the fetus is about 9 inches long and weighs about 1 pounds. You will likely begin to feel the baby move (quickening) between 18 and 20 weeks of the pregnancy. BODY CHANGES Your body goes through many changes during pregnancy. The changes vary from woman to woman.  Your weight will continue to increase. You will notice your lower abdomen bulging out. You may begin to get stretch marks on your  hips, abdomen, and breasts. You may develop headaches that can be relieved by medicines approved by your health care provider. You may urinate more often because the fetus is pressing on your bladder. You may develop or continue to have heartburn as a result of your pregnancy. You may develop constipation because certain hormones are causing the muscles that push waste through your intestines to slow down. You may develop hemorrhoids or swollen, bulging veins (varicose veins). You may have back pain because of the weight gain and pregnancy hormones relaxing your joints between the bones in your pelvis and as a result of a shift in weight and the muscles that support your balance. Your breasts will continue to grow and be tender. Your gums may bleed and may be sensitive to brushing and flossing. Dark spots or blotches (chloasma, mask of pregnancy) may develop on your face. This will likely fade after the baby is born. A dark line from your belly button to the pubic area (linea nigra) may appear. This  will likely fade after the baby is born. You may have changes in your hair. These can include thickening of your hair, rapid growth, and changes in texture. Some women also have hair loss during or after pregnancy, or hair that feels dry or thin. Your hair will most likely return to normal after your baby is born. WHAT TO EXPECT AT YOUR PRENATAL VISITS During a routine prenatal visit: You will be weighed to make sure you and the fetus are growing normally. Your blood pressure will be taken. Your abdomen will be measured to track your baby's growth. The fetal heartbeat will be listened to. Any test results from the previous visit will be discussed. Your health care provider may ask you: How you are feeling. If you are feeling the baby move. If you have had any abnormal symptoms, such as leaking fluid, bleeding, severe headaches, or abdominal cramping. If you have any questions. Other tests that may  be performed during your second trimester include: Blood tests that check for: Low iron levels (anemia). Gestational diabetes (between 24 and 28 weeks). Rh antibodies. Urine tests to check for infections, diabetes, or protein in the urine. An ultrasound to confirm the proper growth and development of the baby. An amniocentesis to check for possible genetic problems. Fetal screens for spina bifida and Down syndrome. HOME CARE INSTRUCTIONS  Avoid all smoking, herbs, alcohol, and unprescribed drugs. These chemicals affect the formation and growth of the baby. Follow your health care provider's instructions regarding medicine use. There are medicines that are either safe or unsafe to take during pregnancy. Exercise only as directed by your health care provider. Experiencing uterine cramps is a good sign to stop exercising. Continue to eat regular, healthy meals. Wear a good support bra for breast tenderness. Do not use hot tubs, steam rooms, or saunas. Wear your seat belt at all times when driving. Avoid raw meat, uncooked cheese, cat litter boxes, and soil used by cats. These carry germs that can cause birth defects in the baby. Take your prenatal vitamins. Try taking a stool softener (if your health care provider approves) if you develop constipation. Eat more high-fiber foods, such as fresh vegetables or fruit and whole grains. Drink plenty of fluids to keep your urine clear or pale yellow. Take warm sitz baths to soothe any pain or discomfort caused by hemorrhoids. Use hemorrhoid cream if your health care provider approves. If you develop varicose veins, wear support hose. Elevate your feet for 15 minutes, 3-4 times a day. Limit salt in your diet. Avoid heavy lifting, wear low heel shoes, and practice good posture. Rest with your legs elevated if you have leg cramps or low back pain. Visit your dentist if you have not gone yet during your pregnancy. Use a soft toothbrush to brush your teeth  and be gentle when you floss. A sexual relationship may be continued unless your health care provider directs you otherwise. Continue to go to all your prenatal visits as directed by your health care provider. SEEK MEDICAL CARE IF:  You have dizziness. You have mild pelvic cramps, pelvic pressure, or nagging pain in the abdominal area. You have persistent nausea, vomiting, or diarrhea. You have a bad smelling vaginal discharge. You have pain with urination. SEEK IMMEDIATE MEDICAL CARE IF:  You have a fever. You are leaking fluid from your vagina. You have spotting or bleeding from your vagina. You have severe abdominal cramping or pain. You have rapid weight gain or loss. You have shortness of  breath with chest pain. You notice sudden or extreme swelling of your face, hands, ankles, feet, or legs. You have not felt your baby move in over an hour. You have severe headaches that do not go away with medicine. You have vision changes. Document Released: 01/01/2001 Document Revised: 01/12/2013 Document Reviewed: 03/10/2012 [facility] Patient Information 2015 [place], [place]. This information is not intended to replace advice given to you by your health care provider. Make sure you discuss any questions you have with your health care provider.

## 2022-02-04 NOTE — Progress Notes (Signed)
[facility] VIRTUAL OBSTETRICS VISIT ENCOUNTER NOTE Patient name: Lisa Christian MRN [id number]  Date of birth: 12-04-1990  I connected with patient on 02/04/22 at 11:50 AM EST by MyChart video  and verified that I am speaking with the correct person using two identifiers. Pt is not currently in our office, she is at home.  The provider is in the office.    I discussed the limitations, risks, security and privacy concerns of performing an evaluation and management service by telephone and the availability of in person appointments. I also discussed with the patient that there may be a patient responsible charge related to this service. The patient expressed understanding and agreed to proceed.  Chief Complaint:   Routine Prenatal Visit (Went to ER 2 nights ago with dehydration due to vomiting/diarrhea)  History of Present Illness:   Lisa Christian is a 32 y.o. G37P1001 female at [redacted]w[redacted]d with an Estimated Date of Delivery: 05/25/22 being evaluated today for ongoing management of a low-risk pregnancy.     11/15/2021   11:04 AM 05/04/2021   10:48 AM 02/02/2018    2:46 PM  Depression screen PHQ 2/9  Decreased Interest 0 0 0  Down, Depressed, Hopeless 0 0 0  PHQ - 2 Score 0 0 0  Altered sleeping 0 1   Tired, decreased energy 0 0   Change in appetite 0 0   Feeling bad or failure about yourself  0 0   Trouble concentrating 0 0   Moving slowly or fidgety/restless 0 0   Suicidal thoughts 0 0   PHQ-9 Score 0 1     Today she reports no complaints. On Friday N/V/D, went to ED, dx w/ GI virus, got 2 bag IVF. Feels better. Mucousy d/c about 6d ago, none since. Feels like baby is lower than 1st baby. Contractions: Not present. Vag. Bleeding: None.  Movement: Present. denies leaking of fluid. Review of Systems:   Pertinent items are noted in HPI Denies abnormal vaginal discharge w/ itching/odor/irritation, headaches, visual changes, shortness of breath, chest pain, abdominal pain, severe  nausea/vomiting, or problems with urination or bowel movements unless otherwise stated above. Pertinent History Reviewed:  Reviewed past medical,surgical, social, obstetrical and family history.  Reviewed problem list, medications and allergies. Physical Assessment:   Vitals:   02/04/22 1200  BP: 107/62  Pulse: 72  Weight: 169 lb (76.7 kg)  Body mass index is 28.12 kg/m.        Physical Examination:   General:  Alert, oriented and cooperative.   Mental Status: Normal mood and affect perceived. Normal judgment and thought content.  Rest of physical exam deferred due to type of encounter  No results found for this or any previous visit (from the past 24 hour(s)).  Assessment & Plan:  1) Pregnancy G2P1001 at [redacted]w[redacted]d with an Estimated Date of Delivery: 05/25/22   2) H/O shoulder dystocia, this baby 99% @ 20wks, plan EFW @ 36wks  3) H/O L4/5 laminectomy>per anesthesia ok for epidural, saw surgeon recently- starting to have some pain, told her if worsens will try steroid shots 1st   Meds: No orders of the defined types were placed in this encounter.  Labs/procedures today: none  Plan:  Continue routine obstetrical care.  Does have home bp cuff. Office bp cuff given: not applicable. Check bp weekly, let us know if consistently >140 and/or >90.  Next visit: prefers will be in person for pn2     Reviewed: Preterm labor symptoms and general obstetric  precautions including but not limited to vaginal bleeding, contractions, leaking of fluid and fetal movement were reviewed in detail with the patient. The patient was advised to call back or seek an in-person office evaluation/go to MAU at [facility] for any urgent or concerning symptoms. All questions were answered. Please refer to After Visit Summary for other counseling recommendations.    I provided 15 minutes of non-face-to-face time during this encounter.  Follow-up: Return in about 4 weeks (around 03/04/2022) for LROB,  PN2, CNM, in person. (Note routed to [place] to schedule)  No orders of the defined types were placed in this encounter.  [place], [facility] 02/04/2022 12:54 PM

## 2022-03-04 ENCOUNTER — Ambulatory Visit (INDEPENDENT_AMBULATORY_CARE_PROVIDER_SITE_OTHER): Payer: BLUE CROSS/BLUE SHIELD | Admitting: Women's Health

## 2022-03-04 ENCOUNTER — Encounter: Payer: Self-pay | Admitting: Women's Health

## 2022-03-04 ENCOUNTER — Other Ambulatory Visit: Payer: BLUE CROSS/BLUE SHIELD

## 2022-03-04 VITALS — BP 99/57 | HR 80 | Wt 173.0 lb

## 2022-03-04 DIAGNOSIS — K824 Cholesterolosis of gallbladder: Secondary | ICD-10-CM | POA: Insufficient documentation

## 2022-03-04 DIAGNOSIS — Z3483 Encounter for supervision of other normal pregnancy, third trimester: Secondary | ICD-10-CM

## 2022-03-04 DIAGNOSIS — Z3480 Encounter for supervision of other normal pregnancy, unspecified trimester: Secondary | ICD-10-CM

## 2022-03-04 DIAGNOSIS — Z131 Encounter for screening for diabetes mellitus: Secondary | ICD-10-CM

## 2022-03-04 DIAGNOSIS — I340 Nonrheumatic mitral (valve) insufficiency: Secondary | ICD-10-CM | POA: Insufficient documentation

## 2022-03-04 DIAGNOSIS — Z3A27 27 weeks gestation of pregnancy: Secondary | ICD-10-CM

## 2022-03-04 DIAGNOSIS — Z3A28 28 weeks gestation of pregnancy: Secondary | ICD-10-CM

## 2022-03-04 NOTE — Progress Notes (Signed)
LOW-RISK PREGNANCY VISIT Patient name: Lisa Christian MRN [id number]  Date of birth: 10/28/1990 Chief Complaint:   Routine Prenatal Visit (PN2)  History of Present Illness:   Lisa Christian is a 32 y.o. G13P1001 female at 12w2dwith an Estimated Date of Delivery: 05/25/22 being seen today for ongoing management of a low-risk pregnancy.   Today she reports  feeling dizzy like she was going to pass out while shopping recently. Sat down for awhile, but when got up happened again. Got something to eat and did feel better. Remembers this happening some w/ 1st pregnancy. Concerned b/c of h/o mitral regurgitation that was found on 2023 echo, states cardiologist told her it was very mild and wouldn't cause any problems. Also concerned b/c of gallbladder polyp that was found in 2023.  .Marland KitchenContractions: Not present. Vag. Bleeding: None.  Movement: Present. denies leaking of fluid.     11/15/2021   11:04 AM 05/04/2021   10:48 AM 02/02/2018    2:46 PM  Depression screen PHQ 2/9  Decreased Interest 0 0 0  Down, Depressed, Hopeless 0 0 0  PHQ - 2 Score 0 0 0  Altered sleeping 0 1   Tired, decreased energy 0 0   Change in appetite 0 0   Feeling bad or failure about yourself  0 0   Trouble concentrating 0 0   Moving slowly or fidgety/restless 0 0   Suicidal thoughts 0 0   PHQ-9 Score 0 1         11/15/2021   11:04 AM 05/04/2021   10:49 AM  GAD 7 : Generalized Anxiety Score  Nervous, Anxious, on Edge 0 0  Control/stop worrying 0 0  Worry too much - different things 0 0  Trouble relaxing 1 0  Restless 0 0  Easily annoyed or irritable 0 0  Afraid - awful might happen 0 0  Total GAD 7 Score 1 0      Review of Systems:   Pertinent items are noted in HPI Denies abnormal vaginal discharge w/ itching/odor/irritation, headaches, visual changes, shortness of breath, chest pain, abdominal pain, severe nausea/vomiting, or problems with urination or bowel movements unless otherwise stated  above. Pertinent History Reviewed:  Reviewed past medical,surgical, social, obstetrical and family history.  Reviewed problem list, medications and allergies. Physical Assessment:   Vitals:   03/04/22 0929  BP: (!) 99/57  Pulse: 80  Weight: 173 lb (78.5 kg)  Body mass index is 28.79 kg/m.        Physical Examination:   General appearance: Well appearing, and in no distress  Mental status: Alert, oriented to person, place, and time  Skin: Warm & dry  Cardiovascular: Normal heart rate noted  Respiratory: Normal respiratory effort, no distress  Abdomen: Soft, gravid, nontender  Pelvic: Cervical exam deferred         Extremities: Edema: None  Fetal Status: Fetal Heart Rate (bpm): 142 Fundal Height: 28 cm Movement: Present    Chaperone: N/A   No results found for this or any previous visit (from the past 24 hour(s)).  Assessment & Plan:  1) Low-risk pregnancy G2P1001 at 241w2dith an Estimated Date of Delivery: 05/25/22   2) Recent near syncopal episode, sounds like hypoglycemic event as sx resolved after eating. Has not been eating great. Discussed eating protein w/ carbs, and small frequent snacks w/ protein, staying hydrated.   3) Mild mitral and pulmonic valve regurgitation> on echo 2023, per Dr's note, very mild. Pt  states she was told wouldn't cause any problems. 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 States she has f/u in May of this year.   5) H/O L4/L5 laminectomy> per previous discussion w/ WGideonanesthesia ok for epidural  6) H/O LGA w/ shoulder dystocia> plan EFW @ 36wks    Meds: No orders of the defined types were placed in this encounter.  Labs/procedures today: PN2 and declines tdap today, maybe next visit  Plan:  Continue routine obstetrical care  Next visit: prefers online    Reviewed: Preterm labor  symptoms and general obstetric precautions including but not limited to vaginal bleeding, contractions, leaking of fluid and fetal movement were reviewed in detail with the patient.  All questions were answered. Does have home bp cuff. Office bp cuff given: not applicable. Check bp weekly, let uKoreaknow if consistently >140 and/or >90.  Follow-up: Return in about 2 weeks (around 03/18/2022) for [place], in person or online.  Future Appointments  Date Time Provider DMontara 03/18/2022  2:10 PM BRoma Schanz CNM [facility]     No orders of the defined types were placed in this encounter.  [facility] [facility]03/04/2022 1:05 PM

## 2022-03-04 NOTE — Patient Instructions (Signed)
Lisa Christian, thank you for choosing our office today! We appreciate the opportunity to meet your healthcare needs. You may receive a short survey by mail, e-mail, or through [employer]. If you are happy with your care we would appreciate if you could take just a few minutes to complete the survey questions. We read all of your comments and take your feedback very seriously. Thank you again for choosing our office.  Center for [employer] Team at [facility] ([place], [place] [address]) Entrance C, located off of [place] parking   CLASSES: Go to [email or web address] to register for classes (childbirth, breastfeeding, waterbirth, infant CPR, daddy bootcamp, etc.)  Call the office [phone]) or go to [facility] if: You begin to have strong, frequent contractions Your water breaks.  Sometimes it is a big gush of fluid, sometimes it is just a trickle that keeps getting your panties wet or running down your legs You have vaginal bleeding.  It is normal to have a small amount of spotting if your cervix was checked.  You don't feel your baby moving like normal.  If you don't, get you something to eat and drink and lay down and focus on feeling your baby move.   If your baby is still not moving like normal, you should call the office or go to [facility].  Call the office [phone]) or go to [facility] hospital for these signs of pre-eclampsia: Severe headache that does not go away with Tylenol Visual changes- seeing spots, double, blurred vision Pain under your right breast or upper abdomen that does not go away with Tums or heartburn medicine Nausea and/or vomiting Severe swelling in your hands, feet, and face   Tdap Vaccine It is recommended that you get the Tdap vaccine during the third trimester of EACH pregnancy to help protect your baby from getting pertussis (whooping cough) 27-36 weeks is the BEST time to do  this so that you can pass the protection on to your baby. During pregnancy is better than after pregnancy, but if you are unable to get it during pregnancy it will be offered at the hospital.  You can get this vaccine with [place], at the health department, your family doctor, or some local pharmacies Everyone who will be around your baby should also be up-to-date on their vaccines before the baby comes. Adults (who are not pregnant) only need 1 dose of Tdap during adulthood.   [facility] Pediatricians/Family Doctors [facility]): [address] [place], [facility]: [address] [place], [phone]                [facility]): [place], [phone] (call to ask if accepting patients) [facility] Department: [address], [place], [place] Pediatricians/Family Doctors [facility]): [place] [place], [address], [place] [facility]: [address] [place], [facility] [place]: [place], [place] [facility]): [phone] [facility]: [address] [place]: [address] [place] [facility]: [phone], [phone]  Home Blood Pressure Monitoring for Patients   Your provider has recommended that you check your  blood pressure (BP) at least once a week at home. If you do not have a blood pressure cuff at home, one will be provided for you. Contact your provider if you have not received your monitor within 1 week.   Helpful Tips for Accurate Home Blood Pressure Checks  Don't smoke, exercise, or drink caffeine 30 minutes before checking your BP Use the restroom before checking your BP (a full bladder can raise your  pressure) Relax in a comfortable upright chair Feet on the ground Left arm resting comfortably on a flat surface at the level of your heart Legs uncrossed Back supported Sit quietly and don't talk Place the cuff on your bare arm Adjust snuggly, so that only two fingertips can fit between your skin and the top of the cuff Check 2 readings separated by at least one minute Keep a log of your BP readings For a visual, please reference this diagram: http://ccnc.care/bpdiagram  Provider Name: [facility]     Phone: [phone]  Zone 1: ALL CLEAR  Continue to monitor your symptoms:  BP reading is less than 140 (top number) or less than 90 (bottom number)  No right upper stomach pain No headaches or seeing spots No feeling nauseated or throwing up No swelling in face and hands  Zone 2: CAUTION Call your doctor's office for any of the following:  BP reading is greater than 140 (top number) or greater than 90 (bottom number)  Stomach pain under your ribs in the middle or right side Headaches or seeing spots Feeling nauseated or throwing up Swelling in face and hands  Zone 3: EMERGENCY  Seek immediate medical care if you have any of the following:  BP reading is greater than160 (top number) or greater than 110 (bottom number) Severe headaches not improving with Tylenol Serious difficulty catching your breath Any worsening symptoms from Zone 2   Third Trimester of Pregnancy The third trimester is from week 29 through week 42, months 7 through 9. The third trimester is a time when the fetus is growing rapidly. At the end of the ninth month, the fetus is about 20 inches in length and weighs 6-10 pounds.  BODY CHANGES Your body goes through many changes during pregnancy. The changes vary from woman to woman.  Your weight will continue to increase. You can expect to gain 25-35 pounds (11-16 kg) by the end of the pregnancy. You may begin to get stretch marks on your hips, abdomen,  and breasts. You may urinate more often because the fetus is moving lower into your pelvis and pressing on your bladder. You may develop or continue to have heartburn as a result of your pregnancy. You may develop constipation because certain hormones are causing the muscles that push waste through your intestines to slow down. You may develop hemorrhoids or swollen, bulging veins (varicose veins). You may have pelvic pain because of the weight gain and pregnancy hormones relaxing your joints between the bones in your pelvis. Backaches may result from overexertion of the muscles supporting your posture. You may have changes in your hair. These can include thickening of your hair, rapid growth, and changes in texture. Some women also have hair loss during or after pregnancy, or hair that feels dry or thin. Your hair will most likely return to normal after your baby is born. Your breasts will continue to grow and be tender. A yellow discharge may leak from your breasts called colostrum. Your belly button may stick out. You may   feel short of breath because of your expanding uterus. You may notice the fetus "dropping," or moving lower in your abdomen. You may have a bloody mucus discharge. This usually occurs a few days to a week before labor begins. Your cervix becomes thin and soft (effaced) near your due date. WHAT TO EXPECT AT YOUR PRENATAL EXAMS  You will have prenatal exams every 2 weeks until week 36. Then, you will have weekly prenatal exams. During a routine prenatal visit: You will be weighed to make sure you and the fetus are growing normally. Your blood pressure is taken. Your abdomen will be measured to track your baby's growth. The fetal heartbeat will be listened to. Any test results from the previous visit will be discussed. You may have a cervical check near your due date to see if you have effaced. At around 36 weeks, your caregiver will check your cervix. At the same time, your  caregiver will also perform a test on the secretions of the vaginal tissue. This test is to determine if a type of bacteria, Group B streptococcus, is present. Your caregiver will explain this further. Your caregiver may ask you: What your birth plan is. How you are feeling. If you are feeling the baby move. If you have had any abnormal symptoms, such as leaking fluid, bleeding, severe headaches, or abdominal cramping. If you have any questions. Other tests or screenings that may be performed during your third trimester include: Blood tests that check for low iron levels (anemia). Fetal testing to check the health, activity level, and growth of the fetus. Testing is done if you have certain medical conditions or if there are problems during the pregnancy. FALSE LABOR You may feel small, irregular contractions that eventually go away. These are called Braxton Hicks contractions, or false labor. Contractions may last for hours, days, or even weeks before true labor sets in. If contractions come at regular intervals, intensify, or become painful, it is best to be seen by your caregiver.  SIGNS OF LABOR  Menstrual-like cramps. Contractions that are 5 minutes apart or less. Contractions that start on the top of the uterus and spread down to the lower abdomen and back. A sense of increased pelvic pressure or back pain. A watery or bloody mucus discharge that comes from the vagina. If you have any of these signs before the 37th week of pregnancy, call your caregiver right away. You need to go to the hospital to get checked immediately. HOME CARE INSTRUCTIONS  Avoid all smoking, herbs, alcohol, and unprescribed drugs. These chemicals affect the formation and growth of the baby. Follow your caregiver's instructions regarding medicine use. There are medicines that are either safe or unsafe to take during pregnancy. Exercise only as directed by your caregiver. Experiencing uterine cramps is a good sign to  stop exercising. Continue to eat regular, healthy meals. Wear a good support bra for breast tenderness. Do not use hot tubs, steam rooms, or saunas. Wear your seat belt at all times when driving. Avoid raw meat, uncooked cheese, cat litter boxes, and soil used by cats. These carry germs that can cause birth defects in the baby. Take your prenatal vitamins. Try taking a stool softener (if your caregiver approves) if you develop constipation. Eat more high-fiber foods, such as fresh vegetables or fruit and whole grains. Drink plenty of fluids to keep your urine clear or pale yellow. Take warm sitz baths to soothe any pain or discomfort caused by hemorrhoids. Use hemorrhoid cream if   your caregiver approves. If you develop varicose veins, wear support hose. Elevate your feet for 15 minutes, 3-4 times a day. Limit salt in your diet. Avoid heavy lifting, wear low heal shoes, and practice good posture. Rest a lot with your legs elevated if you have leg cramps or low back pain. Visit your dentist if you have not gone during your pregnancy. Use a soft toothbrush to brush your teeth and be gentle when you floss. A sexual relationship may be continued unless your caregiver directs you otherwise. Do not travel far distances unless it is absolutely necessary and only with the approval of your caregiver. Take prenatal classes to understand, practice, and ask questions about the labor and delivery. Make a trial run to the hospital. Pack your hospital bag. Prepare the baby's nursery. Continue to go to all your prenatal visits as directed by your caregiver. SEEK MEDICAL CARE IF: You are unsure if you are in labor or if your water has broken. You have dizziness. You have mild pelvic cramps, pelvic pressure, or nagging pain in your abdominal area. You have persistent nausea, vomiting, or diarrhea. You have a bad smelling vaginal discharge. You have pain with urination. SEEK IMMEDIATE MEDICAL CARE IF:  You  have a fever. You are leaking fluid from your vagina. You have spotting or bleeding from your vagina. You have severe abdominal cramping or pain. You have rapid weight loss or gain. You have shortness of breath with chest pain. You notice sudden or extreme swelling of your face, hands, ankles, feet, or legs. You have not felt your baby move in over an hour. You have severe headaches that do not go away with medicine. You have vision changes. Document Released: 01/01/2001 Document Revised: 01/12/2013 Document Reviewed: 03/10/2012 ExitCare Patient Information 2015 [employer]. This information is not intended to replace advice given to you by your health care provider. Make sure you discuss any questions you have with your health care provider.       

## 2022-03-05 LAB — CBC
Hematocrit: 32.9 % — ABNORMAL LOW (ref 34.0–46.6)
Hemoglobin: 10.6 g/dL — ABNORMAL LOW (ref 11.1–15.9)
MCH: 31.6 pg (ref 26.6–33.0)
MCHC: 32.2 g/dL (ref 31.5–35.7)
MCV: 98 fL — ABNORMAL HIGH (ref 79–97)
Platelets: 185 10*3/uL (ref 150–450)
RBC: 3.35 x10E6/uL — ABNORMAL LOW (ref 3.77–5.28)
RDW: 11.7 % (ref 11.7–15.4)
WBC: 11.9 10*3/uL — ABNORMAL HIGH (ref 3.4–10.8)

## 2022-03-05 LAB — ANTIBODY SCREEN: Antibody Screen: NEGATIVE

## 2022-03-05 LAB — HIV ANTIBODY (ROUTINE TESTING W REFLEX): HIV Screen 4th Generation wRfx: NONREACTIVE

## 2022-03-05 LAB — GLUCOSE TOLERANCE, 2 HOURS W/ 1HR
Glucose, 1 hour: 134 mg/dL (ref 70–179)
Glucose, 2 hour: 89 mg/dL (ref 70–152)
Glucose, Fasting: 83 mg/dL (ref 70–91)

## 2022-03-05 LAB — RPR: RPR Ser Ql: NONREACTIVE

## 2022-03-18 ENCOUNTER — Telehealth (INDEPENDENT_AMBULATORY_CARE_PROVIDER_SITE_OTHER): Payer: BLUE CROSS/BLUE SHIELD | Admitting: Women's Health

## 2022-03-18 ENCOUNTER — Encounter: Payer: Self-pay | Admitting: Women's Health

## 2022-03-18 VITALS — BP 112/63 | HR 87

## 2022-03-18 DIAGNOSIS — O09293 Supervision of pregnancy with other poor reproductive or obstetric history, third trimester: Secondary | ICD-10-CM

## 2022-03-18 DIAGNOSIS — Z3483 Encounter for supervision of other normal pregnancy, third trimester: Secondary | ICD-10-CM

## 2022-03-18 DIAGNOSIS — O26893 Other specified pregnancy related conditions, third trimester: Secondary | ICD-10-CM

## 2022-03-18 DIAGNOSIS — Z3A30 30 weeks gestation of pregnancy: Secondary | ICD-10-CM

## 2022-03-18 DIAGNOSIS — R3911 Hesitancy of micturition: Secondary | ICD-10-CM

## 2022-03-18 NOTE — Progress Notes (Signed)
[facility] VIRTUAL OBSTETRICS VISIT ENCOUNTER NOTE Patient name: Cylinda Neilsen MRN [id number]  Date of birth: 04-11-1990  I connected with patient on 03/18/22 at  2:10 PM EST by MyChart video and verified that I am speaking with the correct person using two identifiers. Pt is not currently in our office, she is at home.  The provider is in the office.    I discussed the limitations, risks, security and privacy concerns of performing an evaluation and management service by telephone and the availability of in person appointments. I also discussed with the patient that there may be a patient responsible charge related to this service. The patient expressed understanding and agreed to proceed.  Chief Complaint:   Routine Prenatal Visit  History of Present Illness:   Selen Fess is a 32 y.o. G2P1001 female at 62w2dwith an Estimated Date of Delivery: 05/25/22 being evaluated today for ongoing management of a low-risk pregnancy.     11/15/2021   11:04 AM 05/04/2021   10:48 AM 02/02/2018    2:46 PM  Depression screen PHQ 2/9  Decreased Interest 0 0 0  Down, Depressed, Hopeless 0 0 0  PHQ - 2 Score 0 0 0  Altered sleeping 0 1   Tired, decreased energy 0 0   Change in appetite 0 0   Feeling bad or failure about yourself  0 0   Trouble concentrating 0 0   Moving slowly or fidgety/restless 0 0   Suicidal thoughts 0 0   PHQ-9 Score 0 1     Today she reports  for about 2wks feels doesn't completely empty bladder completely, then when she does empty bladder feels sore afterwards . Contractions: Not present. Vag. Bleeding: None.  Movement: Present. denies leaking of fluid. Review of Systems:   Pertinent items are noted in HPI Denies abnormal vaginal discharge w/ itching/odor/irritation, headaches, visual changes, shortness of breath, chest pain, abdominal pain, severe nausea/vomiting, or problems with urination or bowel movements unless otherwise stated above. Pertinent History  Reviewed:  Reviewed past medical,surgical, social, obstetrical and family history.  Reviewed problem list, medications and allergies. Physical Assessment:   Vitals:   03/18/22 1404  BP: 112/63  Pulse: 87  There is no height or weight on file to calculate BMI.        Physical Examination:   General:  Alert, oriented and cooperative.   Mental Status: Normal mood and affect perceived. Normal judgment and thought content.  Rest of physical exam deferred due to type of encounter  No results found for this or any previous visit (from the past 24 hour(s)).  Assessment & Plan:  1) Pregnancy G2P1001 at 346w2dith an Estimated Date of Delivery: 05/25/22   2) Urinary hesitancy/bladder soreness, to come in for nurse urine visit  3) H/O L4/L5 laminectomy> per previous discussion w/ WCDuncannesthesia ok for epidural   4) H/O LGA w/ shoulder dystocia> plan EFW @ 36wks   Meds: No orders of the defined types were placed in this encounter.   Labs/procedures today: none  Plan:  Continue routine obstetrical care.  Does have home bp cuff. Office bp cuff given: not applicable. Check bp weekly, let usKoreanow if consistently >140 and/or >90.  Next visit: prefers will be in person for urine check, then online for next prenatal     Reviewed: Preterm labor symptoms and general obstetric precautions including but not limited to vaginal bleeding, contractions, leaking of fluid and fetal movement were reviewed in detail with  the patient. The patient was advised to call back or seek an in-person office evaluation/go to [facility] for any urgent or concerning symptoms. All questions were answered. Please refer to After Visit Summary for other counseling recommendations.    I provided 15 minutes of non-face-to-face time during this encounter.  Follow-up: Return for asap nurse/urine visit; 2wks LROB w/ CNM online; efw u/s and LROB CNM in 6wks.  No orders of the defined types were placed in  this encounter.  [place], [facility] 03/18/2022 2:30 PM

## 2022-03-18 NOTE — Patient Instructions (Signed)
Lisa Christian, thank you for choosing our office today! We appreciate the opportunity to meet your healthcare needs. You may receive a short survey by mail, e-mail, or through [employer]. If you are happy with your care we would appreciate if you could take just a few minutes to complete the survey questions. We read all of your comments and take your feedback very seriously. Thank you again for choosing our office.  Center for [employer] Team at [place] at [facility] ([address], [place] [address]) Entrance C, located off of [address] parking   CLASSES: Go to [email or web address] to register for classes (childbirth, breastfeeding, waterbirth, infant CPR, daddy bootcamp, etc.)  Call the office [phone]) or go to [facility] if: You begin to have strong, frequent contractions Your water breaks.  Sometimes it is a big gush of fluid, sometimes it is just a trickle that keeps getting your panties wet or running down your legs You have vaginal bleeding.  It is normal to have a small amount of spotting if your cervix was checked.  You don't feel your baby moving like normal.  If you don't, get you something to eat and drink and lay down and focus on feeling your baby move.   If your baby is still not moving like normal, you should call the office or go to [facility].  Call the office [phone]) or go to [facility] hospital for these signs of pre-eclampsia: Severe headache that does not go away with Tylenol Visual changes- seeing spots, double, blurred vision Pain under your right breast or upper abdomen that does not go away with Tums or heartburn medicine Nausea and/or vomiting Severe swelling in your hands, feet, and face   Tdap Vaccine It is recommended that you get the Tdap vaccine during the third trimester of EACH pregnancy to help protect your baby from getting pertussis (whooping cough) 27-36 weeks is the BEST time to do  this so that you can pass the protection on to your baby. During pregnancy is better than after pregnancy, but if you are unable to get it during pregnancy it will be offered at the hospital.  You can get this vaccine with [place], at the health department, your family doctor, or some local pharmacies Everyone who will be around your baby should also be up-to-date on their vaccines before the baby comes. Adults (who are not pregnant) only need 1 dose of Tdap during adulthood.   [facility] Pediatricians/Family Doctors [facility]): [address] [place], [place] [facility]: [address] [place], [phone]                [facility]): [place], [phone] (call to ask if accepting patients) [facility] Department: [address], [place], [place] Pediatricians/Family Doctors [facility]): [place] [place], [address], [facility]: [address] [place], [place] [facility]: [place], [place] [facility]): [phone] [facility]: [address] [place]: [address], [place] [facility]: [phone], [phone]  Home Blood Pressure Monitoring for Patients   Your provider has recommended that you check your  blood pressure (BP) at least once a week at home. If you do not have a blood pressure cuff at home, one will be provided for you. Contact your provider if you have not received your monitor within 1 week.   Helpful Tips for Accurate Home Blood Pressure Checks  Don't smoke, exercise, or drink caffeine 30 minutes before checking your BP Use the restroom before checking your BP (a full bladder can raise your  pressure) Relax in a comfortable upright chair Feet on the ground Left arm resting comfortably on a flat surface at the level of your heart Legs uncrossed Back supported Sit quietly and don't talk Place the cuff on your bare arm Adjust snuggly, so that only two fingertips can fit between your skin and the top of the cuff Check 2 readings separated by at least one minute Keep a log of your BP readings For a visual, please reference this diagram: http://ccnc.care/bpdiagram  Provider Name: [facility]     Phone: [phone]  Zone 1: ALL CLEAR  Continue to monitor your symptoms:  BP reading is less than 140 (top number) or less than 90 (bottom number)  No right upper stomach pain No headaches or seeing spots No feeling nauseated or throwing up No swelling in face and hands  Zone 2: CAUTION Call your doctor's office for any of the following:  BP reading is greater than 140 (top number) or greater than 90 (bottom number)  Stomach pain under your ribs in the middle or right side Headaches or seeing spots Feeling nauseated or throwing up Swelling in face and hands  Zone 3: EMERGENCY  Seek immediate medical care if you have any of the following:  BP reading is greater than160 (top number) or greater than 110 (bottom number) Severe headaches not improving with Tylenol Serious difficulty catching your breath Any worsening symptoms from Zone 2  Preterm Labor and Birth Information  The normal length of a pregnancy is 39-41 weeks. Preterm labor is when labor starts before 37 completed weeks of pregnancy. What are the risk factors for preterm labor? Preterm labor is more likely to occur in women who: Have certain infections during pregnancy such as a bladder infection, sexually transmitted infection, or infection inside the uterus (chorioamnionitis). Have a shorter-than-normal cervix. Have gone into preterm labor before. Have had surgery on their cervix. Are younger than age 17  or older than age 35. Are African American. Are pregnant with twins or multiple babies (multiple gestation). Take street drugs or smoke while pregnant. Do not gain enough weight while pregnant. Became pregnant shortly after having been pregnant. What are the symptoms of preterm labor? Symptoms of preterm labor include: Cramps similar to those that can happen during a menstrual period. The cramps may happen with diarrhea. Pain in the abdomen or lower back. Regular uterine contractions that may feel like tightening of the abdomen. A feeling of increased pressure in the pelvis. Increased watery or bloody mucus discharge from the vagina. Water breaking (ruptured amniotic sac). Why is it important to recognize signs of preterm labor? It is important to recognize signs of preterm labor because babies who are born prematurely may not be fully developed. This can put them at an increased risk for: Long-term (chronic) heart and lung problems. Difficulty immediately after birth with regulating body systems, including blood sugar, body temperature, heart rate, and breathing rate. Bleeding in the brain. Cerebral palsy. Learning difficulties. Death. These risks are highest for babies who are born before 34 weeks   of pregnancy. How is preterm labor treated? Treatment depends on the length of your pregnancy, your condition, and the health of your baby. It may involve: Having a stitch (suture) placed in your cervix to prevent your cervix from opening too early (cerclage). Taking or being given medicines, such as: Hormone medicines. These may be given early in pregnancy to help support the pregnancy. Medicine to stop contractions. Medicines to help mature the baby's lungs. These may be prescribed if the risk of delivery is high. Medicines to prevent your baby from developing cerebral palsy. If the labor happens before 34 weeks of pregnancy, you may need to stay in the hospital. What should I do if I  think I am in preterm labor? If you think that you are going into preterm labor, call your health care provider right away. How can I prevent preterm labor in future pregnancies? To increase your chance of having a full-term pregnancy: Do not use any tobacco products, such as cigarettes, chewing tobacco, and e-cigarettes. If you need help quitting, ask your health care provider. Do not use street drugs or medicines that have not been prescribed to you during your pregnancy. Talk with your health care provider before taking any herbal supplements, even if you have been taking them regularly. Make sure you gain a healthy amount of weight during your pregnancy. Watch for infection. If you think that you might have an infection, get it checked right away. Make sure to tell your health care provider if you have gone into preterm labor before. This information is not intended to replace advice given to you by your health care provider. Make sure you discuss any questions you have with your health care provider. Document Revised: 05/01/2018 Document Reviewed: 05/31/2015 Elsevier Patient Education  2020 Elsevier Inc.   

## 2022-03-19 ENCOUNTER — Other Ambulatory Visit (INDEPENDENT_AMBULATORY_CARE_PROVIDER_SITE_OTHER): Payer: BLUE CROSS/BLUE SHIELD

## 2022-03-19 VITALS — BP 102/59 | HR 86

## 2022-03-19 DIAGNOSIS — R3911 Hesitancy of micturition: Secondary | ICD-10-CM

## 2022-03-19 DIAGNOSIS — Z87440 Personal history of urinary (tract) infections: Secondary | ICD-10-CM

## 2022-03-19 LAB — POCT URINALYSIS DIPSTICK OB
Blood, UA: NEGATIVE
Glucose, UA: NEGATIVE
Ketones, UA: NEGATIVE
Nitrite, UA: NEGATIVE
POC,PROTEIN,UA: NEGATIVE

## 2022-03-19 MED ORDER — NITROFURANTOIN MONOHYD MACRO 100 MG PO CAPS: 100.0000 mg | ORAL_CAPSULE | Freq: Two times a day (BID) | ORAL | 0 refills | Status: DC

## 2022-03-19 NOTE — Addendum Note (Signed)
Addended by: Roma Schanz on: 03/19/2022 04:40 PM   Modules accepted: Orders

## 2022-03-19 NOTE — Progress Notes (Addendum)
   NURSE VISIT- UTI SYMPTOMS   SUBJECTIVE:  Lisa Christian is a 32 y.o. G36P1001 female here for UTI symptoms. She is 52w3dpregnant. She reports urinary hesitancy and lower abdominal soreness .  OBJECTIVE:  BP (!) 102/59 (BP Location: Right Arm, Patient Position: Sitting, Cuff Size: Normal)   Pulse 86   LMP 08/18/2021   Appears well, in no apparent distress  Results for orders placed or performed in visit on 03/19/22 (from the past 24 hour(s))  POC Urinalysis Dipstick OB   Collection Time: 03/19/22  4:25 PM  Result Value Ref Range   Color, UA     Clarity, UA     Glucose, UA Negative Negative   Bilirubin, UA     Ketones, UA neg    Spec Grav, UA     Blood, UA neg    pH, UA     POC,PROTEIN,UA Negative Negative, Trace, Small (1+), Moderate (2+), Large (3+), 4+   Urobilinogen, UA     Nitrite, UA neg    Leukocytes, UA Trace (A) Negative   Appearance     Odor      ASSESSMENT: Pregnancy 347w3dith UTI symptoms and negative nitrites  PLAN: [facility] with KiKnute NeuCNM, [facility] Rx sent by provider today: Yes Urine culture sent Call or return to clinic prn if these symptoms worsen or fail to improve as anticipated. Follow-up: as scheduled   LaAlice Rieger2/27/2024 4:25 PM  Chart reviewed for nurse visit. Agree with plan of care. Pt w/ urinary hesitancy, only dribbles, hard to empty bladder, bladder soreness after voiding. Will send urine cx and rx macrobid.  Bo[place]oma Schanz[place]orth [place]/27/2024 4:39 PM

## 2022-03-22 LAB — URINE CULTURE

## 2022-03-25 ENCOUNTER — Encounter: Payer: Self-pay | Admitting: Women's Health

## 2022-03-25 ENCOUNTER — Telehealth: Payer: Self-pay | Admitting: Women's Health

## 2022-03-25 NOTE — Telephone Encounter (Signed)
Pt has questions about her lab results. Please advise

## 2022-04-01 ENCOUNTER — Encounter: Payer: Self-pay | Admitting: Women's Health

## 2022-04-01 ENCOUNTER — Telehealth (INDEPENDENT_AMBULATORY_CARE_PROVIDER_SITE_OTHER): Payer: BLUE CROSS/BLUE SHIELD | Admitting: Women's Health

## 2022-04-01 VITALS — BP 101/60 | HR 85

## 2022-04-01 DIAGNOSIS — O09293 Supervision of pregnancy with other poor reproductive or obstetric history, third trimester: Secondary | ICD-10-CM

## 2022-04-01 DIAGNOSIS — Z3A32 32 weeks gestation of pregnancy: Secondary | ICD-10-CM

## 2022-04-01 DIAGNOSIS — Z3483 Encounter for supervision of other normal pregnancy, third trimester: Secondary | ICD-10-CM

## 2022-04-01 DIAGNOSIS — Z3480 Encounter for supervision of other normal pregnancy, unspecified trimester: Secondary | ICD-10-CM

## 2022-04-01 NOTE — Patient Instructions (Signed)
Lisa Christian, thank you for choosing our office today! We appreciate the opportunity to meet your healthcare needs. You may receive a short survey by mail, e-mail, or through MyChart. If you are happy with your care we would appreciate if you could take just a few minutes to complete the survey questions. We read all of your comments and take your feedback very seriously. Thank you again for choosing our office.  [facility] [place] ([address] [place], [place] [address]) Entrance C, located off of [address] Free 24/7 valet parking   CLASSES: Go to [email or web address] to register for classes (childbirth, breastfeeding, waterbirth, infant CPR, daddy bootcamp, etc.)  Call the office ([phone]) or go to [facility] if: You begin to have strong, frequent contractions Your water breaks.  Sometimes it is a big gush of fluid, sometimes it is just a trickle that keeps getting your panties wet or running down your legs You have vaginal bleeding.  It is normal to have a small amount of spotting if your cervix was checked.  You don't feel your baby moving like normal.  If you don't, get you something to eat and drink and lay down and focus on feeling your baby move.   If your baby is still not moving like normal, you should call the office or go to [facility].  Call the office ([phone]) or [facility] for these signs of pre-eclampsia: Severe headache that does not go away with Tylenol Visual changes- seeing spots, double, blurred vision Pain under your right breast or upper abdomen that does not go away with Tums or heartburn medicine Nausea and/or vomiting Severe swelling in your hands, feet, and face   Tdap Vaccine It is recommended that you get the Tdap vaccine during the third trimester of EACH pregnancy to help protect your baby from getting pertussis (whooping cough) 27-36 weeks is the BEST time to do  this so that you can pass the protection on to your baby. During pregnancy is better than after pregnancy, but if you are unable to get it during pregnancy it will be offered at the hospital.  You can get this vaccine with us, at the health department, your family doctor, or some local pharmacies Everyone who will be around your baby should also be up-to-date on their vaccines before the baby comes. Adults (who are not pregnant) only need 1 dose of Tdap during adulthood.   [place] Pediatricians/Family Doctors [facility] ([facility]): [address], [phone]           [facility]: [address], [phone]                [facility] ([facility]): [address], [phone] (call to ask if accepting patients) [facility]: [address], [place], [phone]    [place] Pediatricians/Family Doctors [facility] ([facility]): [address], [phone] [facility]: [address], [phone] [facility]: [address], [phone]  [place] Family Doctors  [facility] ([facility]): [phone] [facility]: [address], [phone]   [place] [facility]: [address], [phone]  [place] [facility]  [facility]: [address], [phone]  Home Blood Pressure Monitoring for Patients   Your provider has recommended that you check your   blood pressure (BP) at least once a week at home. If you do not have a blood pressure cuff at home, one will be provided for you. Contact your provider if you have not received your monitor within 1 week.   Helpful Tips for Accurate Home Blood Pressure Checks  Don't smoke, exercise, or drink caffeine 30 minutes before checking your BP Use the restroom before checking your BP (a full bladder can raise your  pressure) Relax in a comfortable upright chair Feet on the ground Left arm resting comfortably on a flat surface at the level of your heart Legs uncrossed Back supported Sit quietly and don't talk Place the cuff on your bare arm Adjust snuggly, so that only two fingertips can fit between your skin and the top of the cuff Check 2 readings separated by at least one minute Keep a log of your BP readings For a visual, please reference this diagram: http://ccnc.care/bpdiagram  Provider Name: [facility]     Phone: [phone]  Zone 1: ALL CLEAR  Continue to monitor your symptoms:  BP reading is less than 140 (top number) or less than 90 (bottom number)  No right upper stomach pain No headaches or seeing spots No feeling nauseated or throwing up No swelling in face and hands  Zone 2: CAUTION Call your doctor's office for any of the following:  BP reading is greater than 140 (top number) or greater than 90 (bottom number)  Stomach pain under your ribs in the middle or right side Headaches or seeing spots Feeling nauseated or throwing up Swelling in face and hands  Zone 3: EMERGENCY  Seek immediate medical care if you have any of the following:  BP reading is greater than160 (top number) or greater than 110 (bottom number) Severe headaches not improving with Tylenol Serious difficulty catching your breath Any worsening symptoms from Zone 2  Preterm Labor and Birth Information  The normal length of a pregnancy is 39-41 weeks. Preterm labor is when labor starts before 37 completed weeks of pregnancy. What are the risk factors for preterm labor? Preterm labor is more likely to occur in women who: Have certain infections during pregnancy such as a bladder infection, sexually transmitted infection, or infection inside the uterus (chorioamnionitis). Have a shorter-than-normal cervix. Have gone into preterm labor before. Have had surgery on their cervix. Are younger than age 17  or older than age 35. Are African American. Are pregnant with twins or multiple babies (multiple gestation). Take street drugs or smoke while pregnant. Do not gain enough weight while pregnant. Became pregnant shortly after having been pregnant. What are the symptoms of preterm labor? Symptoms of preterm labor include: Cramps similar to those that can happen during a menstrual period. The cramps may happen with diarrhea. Pain in the abdomen or lower back. Regular uterine contractions that may feel like tightening of the abdomen. A feeling of increased pressure in the pelvis. Increased watery or bloody mucus discharge from the vagina. Water breaking (ruptured amniotic sac). Why is it important to recognize signs of preterm labor? It is important to recognize signs of preterm labor because babies who are born prematurely may not be fully developed. This can put them at an increased risk for: Long-term (chronic) heart and lung problems. Difficulty immediately after birth with regulating body systems, including blood sugar, body temperature, heart rate, and breathing rate. Bleeding in the brain. Cerebral palsy. Learning difficulties. Death. These risks are highest for babies who are born before 34 weeks   of pregnancy. How is preterm labor treated? Treatment depends on the length of your pregnancy, your condition, and the health of your baby. It may involve: Having a stitch (suture) placed in your cervix to prevent your cervix from opening too early (cerclage). Taking or being given medicines, such as: Hormone medicines. These may be given early in pregnancy to help support the pregnancy. Medicine to stop contractions. Medicines to help mature the baby's lungs. These may be prescribed if the risk of delivery is high. Medicines to prevent your baby from developing cerebral palsy. If the labor happens before 34 weeks of pregnancy, you may need to stay in the hospital. What should I do if I  think I am in preterm labor? If you think that you are going into preterm labor, call your health care provider right away. How can I prevent preterm labor in future pregnancies? To increase your chance of having a full-term pregnancy: Do not use any tobacco products, such as cigarettes, chewing tobacco, and e-cigarettes. If you need help quitting, ask your health care provider. Do not use street drugs or medicines that have not been prescribed to you during your pregnancy. Talk with your health care provider before taking any herbal supplements, even if you have been taking them regularly. Make sure you gain a healthy amount of weight during your pregnancy. Watch for infection. If you think that you might have an infection, get it checked right away. Make sure to tell your health care provider if you have gone into preterm labor before. This information is not intended to replace advice given to you by your health care provider. Make sure you discuss any questions you have with your health care provider. Document Revised: 05/01/2018 Document Reviewed: 05/31/2015 Elsevier Patient Education  2020 Elsevier Inc.   

## 2022-04-01 NOTE — Progress Notes (Signed)
[place] VIRTUAL OBSTETRICS VISIT ENCOUNTER NOTE Patient name: Lisa Christian MRN [id number]  Date of birth: February 20, 1990  I connected with patient on 04/01/22 at  1:30 PM EDT by phone (unable to connect on MyChart) and verified that I am speaking with the correct person using two identifiers. Pt is not currently in our office, she is at home.  The provider is in the office.    I discussed the limitations, risks, security and privacy concerns of performing an evaluation and management service by telephone and the availability of in person appointments. I also discussed with the patient that there may be a patient responsible charge related to this service. The patient expressed understanding and agreed to proceed.  Chief Complaint:   Routine Prenatal Visit (Still having painful bladder)  History of Present Illness:   Lisa Christian is a 32 y.o. G2P1001 female at 32w2dwith an Estimated Date of Delivery: 05/25/22 being evaluated today for ongoing management of a low-risk pregnancy.     11/15/2021   11:04 AM 05/04/2021   10:48 AM 02/02/2018    2:46 PM  Depression screen PHQ 2/9  Decreased Interest 0 0 0  Down, Depressed, Hopeless 0 0 0  PHQ - 2 Score 0 0 0  Altered sleeping 0 1   Tired, decreased energy 0 0   Change in appetite 0 0   Feeling bad or failure about yourself  0 0   Trouble concentrating 0 0   Moving slowly or fidgety/restless 0 0   Suicidal thoughts 0 0   PHQ-9 Score 0 1     Today she reports  still feeling pain/baby on bladder . Contractions: Not present. Vag. Bleeding: None.  Movement: Present. denies leaking of fluid. Review of Systems:   Pertinent items are noted in HPI Denies abnormal vaginal discharge w/ itching/odor/irritation, headaches, visual changes, shortness of breath, chest pain, abdominal pain, severe nausea/vomiting, or problems with urination or bowel movements unless otherwise stated above. Pertinent History Reviewed:  Reviewed past  medical,surgical, social, obstetrical and family history.  Reviewed problem list, medications and allergies. Physical Assessment:   Vitals:   04/01/22 1356  BP: 101/60  Pulse: 85  There is no height or weight on file to calculate BMI.        Physical Examination:   General:  Alert, oriented and cooperative.   Mental Status: Normal mood and affect perceived. Normal judgment and thought content.  Rest of physical exam deferred due to type of encounter  No results found for this or any previous visit (from the past 24 hour(s)).  Assessment & Plan:  1) Pregnancy G2P1001 at 325w2dith an Estimated Date of Delivery: 05/25/22   2) Pain bladder/pelvis, urine cx neg, feels like it is baby's position/pushing on things- can try ktape/belt to see if helps at all   3) H/O L4/L5 laminectomy> per previous discussion w/ [place] ok for epidural   4) H/O LGA w/ shoulder dystocia> plan EFW @ 36wks   Meds: No orders of the defined types were placed in this encounter.  Labs/procedures today: none  Plan:  Continue routine obstetrical care.  Does have home bp cuff. Office bp cuff given: not applicable. Check bp weekly, let usKoreanow if consistently >140 and/or >90.  Next visit: prefers online   Reviewed: Preterm labor symptoms and general obstetric precautions including but not limited to vaginal bleeding, contractions, leaking of fluid and fetal movement were reviewed in detail with the patient. The patient was advised  to call back or seek an in-person office evaluation/go to [facility] at [facility] for any urgent or concerning symptoms. All questions were answered. Please refer to After Visit Summary for other counseling recommendations.    I provided 15 minutes of non-face-to-face time during this encounter.  Follow-up: Return in about 2 weeks (around 04/15/2022) for LROB, CNM, MyChart Video.  No orders of the defined types were placed in this encounter.  [place],  [facility] 04/01/2022 2:22 PM

## 2022-04-15 ENCOUNTER — Encounter: Payer: Self-pay | Admitting: Women's Health

## 2022-04-15 ENCOUNTER — Telehealth (INDEPENDENT_AMBULATORY_CARE_PROVIDER_SITE_OTHER): Payer: BLUE CROSS/BLUE SHIELD | Admitting: Women's Health

## 2022-04-15 VITALS — BP 117/62 | HR 82

## 2022-04-15 DIAGNOSIS — Z3A34 34 weeks gestation of pregnancy: Secondary | ICD-10-CM

## 2022-04-15 DIAGNOSIS — Z3483 Encounter for supervision of other normal pregnancy, third trimester: Secondary | ICD-10-CM

## 2022-04-15 DIAGNOSIS — Z8759 Personal history of other complications of pregnancy, childbirth and the puerperium: Secondary | ICD-10-CM

## 2022-04-15 MED ORDER — HYDROCORTISONE ACETATE 25 MG RE SUPP: 25.0000 mg | Freq: Two times a day (BID) | RECTAL | 4 refills | Status: DC

## 2022-04-15 NOTE — Patient Instructions (Signed)
Lisa Christian, thank you for choosing our office today! We appreciate the opportunity to meet your healthcare needs. You may receive a short survey by mail, e-mail, or through [employer]. If you are happy with your care we would appreciate if you could take just a few minutes to complete the survey questions. We read all of your comments and take your feedback very seriously. Thank you again for choosing our office.  Center for [employer] Team at [facility] ([place], [place] [address]) Entrance C, located off of [place] parking   CLASSES: Go to [email or web address] to register for classes (childbirth, breastfeeding, waterbirth, infant CPR, daddy bootcamp, etc.)  Call the office [phone]) or go to [facility] if: You begin to have strong, frequent contractions Your water breaks.  Sometimes it is a big gush of fluid, sometimes it is just a trickle that keeps getting your panties wet or running down your legs You have vaginal bleeding.  It is normal to have a small amount of spotting if your cervix was checked.  You don't feel your baby moving like normal.  If you don't, get you something to eat and drink and lay down and focus on feeling your baby move.   If your baby is still not moving like normal, you should call the office or go to [facility].  Call the office [phone]) or go to [facility] hospital for these signs of pre-eclampsia: Severe headache that does not go away with Tylenol Visual changes- seeing spots, double, blurred vision Pain under your right breast or upper abdomen that does not go away with Tums or heartburn medicine Nausea and/or vomiting Severe swelling in your hands, feet, and face   Tdap Vaccine It is recommended that you get the Tdap vaccine during the third trimester of EACH pregnancy to help protect your baby from getting pertussis (whooping cough) 27-36 weeks is the BEST time to do  this so that you can pass the protection on to your baby. During pregnancy is better than after pregnancy, but if you are unable to get it during pregnancy it will be offered at the hospital.  You can get this vaccine with [place], at the health department, your family doctor, or some local pharmacies Everyone who will be around your baby should also be up-to-date on their vaccines before the baby comes. Adults (who are not pregnant) only need 1 dose of Tdap during adulthood.   [facility] Pediatricians/Family Doctors [facility]): [address] [place], [facility]: [address] [place], [phone]                [facility]): [place], [phone] (call to ask if accepting patients) [facility] Department: [address], [place], [place] Pediatricians/Family Doctors [facility]): [place] [place], [address], [place] [facility]: [address] [place], [facility] [place]: [place], [place] [facility]): [phone] [facility]: [address] [place]: [address] [place] [facility]: [phone], [phone]  Home Blood Pressure Monitoring for Patients   Your provider has recommended that you check your  blood pressure (BP) at least once a week at home. If you do not have a blood pressure cuff at home, one will be provided for you. Contact your provider if you have not received your monitor within 1 week.   Helpful Tips for Accurate Home Blood Pressure Checks  Don't smoke, exercise, or drink caffeine 30 minutes before checking your BP Use the restroom before checking your BP (a full bladder can raise your  pressure) Relax in a comfortable upright chair Feet on the ground Left arm resting comfortably on a flat surface at the level of your heart Legs uncrossed Back supported Sit quietly and don't talk Place the cuff on your bare arm Adjust snuggly, so that only two fingertips can fit between your skin and the top of the cuff Check 2 readings separated by at least one minute Keep a log of your BP readings For a visual, please reference this diagram: http://ccnc.care/bpdiagram  Provider Name: [facility]     Phone: [phone]  Zone 1: ALL CLEAR  Continue to monitor your symptoms:  BP reading is less than 140 (top number) or less than 90 (bottom number)  No right upper stomach pain No headaches or seeing spots No feeling nauseated or throwing up No swelling in face and hands  Zone 2: CAUTION Call your doctor's office for any of the following:  BP reading is greater than 140 (top number) or greater than 90 (bottom number)  Stomach pain under your ribs in the middle or right side Headaches or seeing spots Feeling nauseated or throwing up Swelling in face and hands  Zone 3: EMERGENCY  Seek immediate medical care if you have any of the following:  BP reading is greater than160 (top number) or greater than 110 (bottom number) Severe headaches not improving with Tylenol Serious difficulty catching your breath Any worsening symptoms from Zone 2  Preterm Labor and Birth Information  The normal length of a pregnancy is 39-41 weeks. Preterm labor is when labor starts before 37 completed weeks of pregnancy. What are the risk factors for preterm labor? Preterm labor is more likely to occur in women who: Have certain infections during pregnancy such as a bladder infection, sexually transmitted infection, or infection inside the uterus (chorioamnionitis). Have a shorter-than-normal cervix. Have gone into preterm labor before. Have had surgery on their cervix. Are younger than age 17  or older than age 35. Are African American. Are pregnant with twins or multiple babies (multiple gestation). Take street drugs or smoke while pregnant. Do not gain enough weight while pregnant. Became pregnant shortly after having been pregnant. What are the symptoms of preterm labor? Symptoms of preterm labor include: Cramps similar to those that can happen during a menstrual period. The cramps may happen with diarrhea. Pain in the abdomen or lower back. Regular uterine contractions that may feel like tightening of the abdomen. A feeling of increased pressure in the pelvis. Increased watery or bloody mucus discharge from the vagina. Water breaking (ruptured amniotic sac). Why is it important to recognize signs of preterm labor? It is important to recognize signs of preterm labor because babies who are born prematurely may not be fully developed. This can put them at an increased risk for: Long-term (chronic) heart and lung problems. Difficulty immediately after birth with regulating body systems, including blood sugar, body temperature, heart rate, and breathing rate. Bleeding in the brain. Cerebral palsy. Learning difficulties. Death. These risks are highest for babies who are born before 34 weeks   of pregnancy. How is preterm labor treated? Treatment depends on the length of your pregnancy, your condition, and the health of your baby. It may involve: Having a stitch (suture) placed in your cervix to prevent your cervix from opening too early (cerclage). Taking or being given medicines, such as: Hormone medicines. These may be given early in pregnancy to help support the pregnancy. Medicine to stop contractions. Medicines to help mature the baby's lungs. These may be prescribed if the risk of delivery is high. Medicines to prevent your baby from developing cerebral palsy. If the labor happens before 34 weeks of pregnancy, you may need to stay in the hospital. What should I do if I  think I am in preterm labor? If you think that you are going into preterm labor, call your health care provider right away. How can I prevent preterm labor in future pregnancies? To increase your chance of having a full-term pregnancy: Do not use any tobacco products, such as cigarettes, chewing tobacco, and e-cigarettes. If you need help quitting, ask your health care provider. Do not use street drugs or medicines that have not been prescribed to you during your pregnancy. Talk with your health care provider before taking any herbal supplements, even if you have been taking them regularly. Make sure you gain a healthy amount of weight during your pregnancy. Watch for infection. If you think that you might have an infection, get it checked right away. Make sure to tell your health care provider if you have gone into preterm labor before. This information is not intended to replace advice given to you by your health care provider. Make sure you discuss any questions you have with your health care provider. Document Revised: 05/01/2018 Document Reviewed: 05/31/2015 Elsevier Patient Education  2020 [employer].  Hemorrhoids Hemorrhoids are swollen veins in and around the rectum or the opening of the butt (anus). There are two types of hemorrhoids: Internal. These occur in the veins just inside the rectum. They may poke through to the outside and become irritated and painful. External. These occur in the veins outside the anus. They can be felt as a painful swelling or hard lump near the anus. Most hemorrhoids do not cause severe problems. Often, they can be treated at home with diet and lifestyle changes. If home treatments do not help, you may need a procedure to shrink or remove the hemorrhoids. What are the causes? Hemorrhoids are caused by pressure near the anus. This pressure may be caused by: Constipation or diarrhea. Straining to poop. Pregnancy. Obesity. Sitting or riding a bike for a  long time. Heavy lifting or other things that cause you to strain. Anal sex. What are the signs or symptoms? Symptoms of this condition include: Pain. Anal itching or irritation. Bleeding from the rectum. Leakage of poop (stool). Swelling of the anus. One or more lumps around the anus. How is this diagnosed? Hemorrhoids can often be diagnosed through a visual exam. Other exams or tests may also be done, such as: A digital rectal exam. This is when your health care provider feels inside your rectum with a gloved finger. Anoscope. This is an exam of the anus using a small tube. A blood test, if you have lost a lot of blood. A sigmoidoscopy or colonoscopy. These are tests to look inside the colon using a tube with a camera on the end. How is this treated? In most cases, hemorrhoids can be treated at home with diet and lifestyle changes. If these  changes do not help, you may need to have a procedure done. These procedures can make the hemorrhoids smaller or fully remove them. Common procedures include: Rubber band ligation. Rubber bands are placed at the base of the hemorrhoids to cut off their blood supply. Sclerotherapy. Medicine is put into the hemorrhoids to shrink them. Infrared coagulation. A type of light energy is used to get rid of the hemorrhoids. Hemorrhoidectomy surgery. The hemorrhoids are removed during surgery. Then, the veins that supply them are tied off. Stapled hemorrhoidopexy surgery. The base of the hemorrhoid is stapled to the wall of the rectum. Follow these instructions at home: Medicines Take over-the-counter and prescription medicines only as told by your provider. Use medicated creams or medicines that are put in the rectum (suppositories) as told by your provider. Eating and drinking  Eat foods that are high in fiber, such as beans, whole grains, and fresh fruits and vegetables. Ask your provider about taking products that have fiber added to them (fiber  supplements). Reduce the amount of fat in your diet. You can do this by eating low-fat dairy products, eating less red meat, and avoiding processed foods. Drink enough fluid to keep your pee (urine) pale yellow. Managing pain and swelling  Take warm sitz baths for 20 minutes, 3-4 times a day. This can help ease pain and discomfort. You may do this in a bathtub or you can use a portable sitz bath that fits over the toilet. If told, put ice on the affected area. It may help to use ice packs between sitz baths. Put ice in a plastic bag. Place a towel between your skin and the bag. Leave the ice on for 20 minutes, 2-3 times a day. If your skin turns bright red, remove the ice right away to prevent skin damage. The risk of damage is higher if you cannot feel pain, heat, or cold. General instructions Exercise. Ask your provider how much and what kind of exercise is best for you. In general, you should do moderate exercise for at least 30 minutes on most days of the week (150 minutes each week). You may want to try walking, biking, or yoga. Go to the bathroom when you have the urge to poop. Do not wait. Avoid straining to poop. Keep the anus dry and clean. Use wet toilet paper or moist towelettes after you poop. Do not sit on the toilet for a long time. This can increase blood pooling and pain. Where to find more information [employer] of Diabetes and Digestive and Kidney Diseases: [employer] Contact a health care provider if: You have more pain and swelling that do not get better with treatment. You have trouble pooping or you are not able to poop. You have pain or inflammation outside the area of the hemorrhoids. Get help right away if: You are bleeding from your rectum and you cannot get it to stop. This information is not intended to replace advice given to you by your health care provider. Make sure you discuss any questions you have with your health care provider. Document  Revised: 09/19/2021 Document Reviewed: 09/19/2021 Elsevier Patient Education  [place].

## 2022-04-15 NOTE — Progress Notes (Signed)
TELEHEALTH VIRTUAL OBSTETRICS VISIT ENCOUNTER NOTE Patient name: Lisa Christian MRN [id number]  Date of birth: [birth year]  I connected with patient on 04/15/22 at  1:30 PM EDT by mychart  and verified that I am speaking with the correct person using two identifiers. Pt is not currently in our office, she is at home.  The provider is in the office.    I discussed the limitations, risks, security and privacy concerns of performing an evaluation and management service by telephone and the availability of in person appointments. I also discussed with the patient that there may be a patient responsible charge related to this service. The patient expressed understanding and agreed to proceed.  Chief Complaint:   Routine Prenatal Visit  History of Present Illness:   Lisa Christian is a 32 y.o. G2P1001 female at [redacted]w[redacted]d with an Estimated Date of Delivery: 05/25/22 being evaluated today for ongoing management of a low-risk pregnancy.     11/15/2021   11:04 AM 05/04/2021   10:48 AM 02/02/2018    2:46 PM  Depression screen PHQ 2/9  Decreased Interest 0 0 0  Down, Depressed, Hopeless 0 0 0  PHQ - 2 Score 0 0 0  Altered sleeping 0 1   Tired, decreased energy 0 0   Change in appetite 0 0   Feeling bad or failure about yourself  0 0   Trouble concentrating 0 0   Moving slowly or fidgety/restless 0 0   Suicidal thoughts 0 0   PHQ-9 Score 0 1     Today she reports  bad hemorrhoids, tried TUCKs pads, Preparation H w/ lidocaine, sitz bath. Is like a grape. Hasn't really been constipated, is taking Miralax prn . Contractions: Not present. Vag. Bleeding: None.  Movement: Present. denies leaking of fluid. Review of Systems:   Pertinent items are noted in HPI Denies abnormal vaginal discharge w/ itching/odor/irritation, headaches, visual changes, shortness of breath, chest pain, abdominal pain, severe nausea/vomiting, or problems with urination or bowel movements unless otherwise stated  above. Pertinent History Reviewed:  Reviewed past medical,surgical, social, obstetrical and family history.  Reviewed problem list, medications and allergies. Physical Assessment:   Vitals:   04/15/22 1334  BP: 117/62  Pulse: 82  There is no height or weight on file to calculate BMI.        Physical Examination:   General:  Alert, oriented and cooperative.   Mental Status: Normal mood and affect perceived. Normal judgment and thought content.  Rest of physical exam deferred due to type of encounter  No results found for this or any previous visit (from the past 24 hour(s)).  Assessment & Plan:  1) Pregnancy G2P1001 at [redacted]w[redacted]d with an Estimated Date of Delivery: 05/25/22   2) Hemorrhoid, rx anusol hc, continue other relief measures. If worsens, make appt   3) H/O L4/L5 laminectomy> per previous discussion w/ [facility] [place] anesthesia ok for epidural   4) H/O LGA w/ shoulder dystocia> plan EFW @ 36wks   Meds:  Meds ordered this encounter  Medications   hydrocortisone (ANUSOL-HC) 25 MG suppository    Sig: Place 1 suppository (25 mg total) rectally 2 (two) times daily.    Dispense:  12 suppository    Refill:  4    Labs/procedures today: none  Plan:  Continue routine obstetrical care.  Does have home bp cuff. Office bp cuff given: not applicable. Check bp weekly, let us know if consistently >140 and/or >90.  Next visit: prefers will be in  person for cultures and u/s     Reviewed: Preterm labor symptoms and general obstetric precautions including but not limited to vaginal bleeding, contractions, leaking of fluid and fetal movement were reviewed in detail with the patient. The patient was advised to call back or seek an in-person office evaluation/go to [facility] for any urgent or concerning symptoms. All questions were answered. Please refer to After Visit Summary for other counseling recommendations.    I provided 10 minutes of non-face-to-face time during this  encounter.  Follow-up: Return for As scheduled.  Orders Placed This Encounter  Procedures   US OB Follow Up   Roma Schanz CNM, [facility] 04/15/2022 1:50 PM

## 2022-04-29 ENCOUNTER — Ambulatory Visit (INDEPENDENT_AMBULATORY_CARE_PROVIDER_SITE_OTHER): Payer: BLUE CROSS/BLUE SHIELD | Admitting: Women's Health

## 2022-04-29 ENCOUNTER — Other Ambulatory Visit (HOSPITAL_COMMUNITY)
Admission: RE | Admit: 2022-04-29 | Discharge: 2022-04-29 | Disposition: A | Discharge status: Home / Self Care | Payer: BLUE CROSS/BLUE SHIELD | Source: Ambulatory Visit | Attending: Women's Health | Admitting: Women's Health

## 2022-04-29 ENCOUNTER — Ambulatory Visit (INDEPENDENT_AMBULATORY_CARE_PROVIDER_SITE_OTHER): Payer: BLUE CROSS/BLUE SHIELD

## 2022-04-29 ENCOUNTER — Encounter: Payer: Self-pay | Admitting: Women's Health

## 2022-04-29 VITALS — BP 108/61 | HR 77 | Wt 181.2 lb

## 2022-04-29 DIAGNOSIS — R112 Nausea with vomiting, unspecified: Secondary | ICD-10-CM

## 2022-04-29 DIAGNOSIS — Z3A36 36 weeks gestation of pregnancy: Secondary | ICD-10-CM | POA: Insufficient documentation

## 2022-04-29 DIAGNOSIS — Z3483 Encounter for supervision of other normal pregnancy, third trimester: Secondary | ICD-10-CM

## 2022-04-29 DIAGNOSIS — Z8759 Personal history of other complications of pregnancy, childbirth and the puerperium: Secondary | ICD-10-CM | POA: Diagnosis not present

## 2022-04-29 DIAGNOSIS — O3663X0 Maternal care for excessive fetal growth, third trimester, not applicable or unspecified: Secondary | ICD-10-CM

## 2022-04-29 DIAGNOSIS — Z3480 Encounter for supervision of other normal pregnancy, unspecified trimester: Secondary | ICD-10-CM

## 2022-04-29 DIAGNOSIS — Z23 Encounter for immunization: Secondary | ICD-10-CM | POA: Diagnosis not present

## 2022-04-29 NOTE — Patient Instructions (Signed)
Lisa Christian, thank you for choosing our office today! We appreciate the opportunity to meet your healthcare needs. You may receive a short survey by mail, e-mail, or through MyChart. If you are happy with your care we would appreciate if you could take just a few minutes to complete the survey questions. We read all of your comments and take your feedback very seriously. Thank you again for choosing our office.  [facility] ([address] [place], [place] [address]) Entrance C, located off of [address] Free 24/7 valet parking   CLASSES: Go to [email or web address] to register for classes (childbirth, breastfeeding, waterbirth, infant CPR, daddy bootcamp, etc.)  Call the office ([phone]) or go to [facility] if: You begin to have strong, frequent contractions Your water breaks.  Sometimes it is a big gush of fluid, sometimes it is just a trickle that keeps getting your panties wet or running down your legs You have vaginal bleeding.  It is normal to have a small amount of spotting if your cervix was checked.  You don't feel your baby moving like normal.  If you don't, get you something to eat and drink and lay down and focus on feeling your baby move.   If your baby is still not moving like normal, you should call the office or go to [facility].  Call the office ([phone]) or go to [facility] for these signs of pre-eclampsia: Severe headache that does not go away with Tylenol Visual changes- seeing spots, double, blurred vision Pain under your right breast or upper abdomen that does not go away with Tums or heartburn medicine Nausea and/or vomiting Severe swelling in your hands, feet, and face   Tdap Vaccine It is recommended that you get the Tdap vaccine during the third trimester of EACH pregnancy to help protect your baby from getting pertussis (whooping cough) 27-36 weeks is the BEST time to do  this so that you can pass the protection on to your baby. During pregnancy is better than after pregnancy, but if you are unable to get it during pregnancy it will be offered at the hospital.  You can get this vaccine with us, at the health department, your family doctor, or some local pharmacies Everyone who will be around your baby should also be up-to-date on their vaccines before the baby comes. Adults (who are not pregnant) only need 1 dose of Tdap during adulthood.   [place] [facility]/Family Doctors [facility] ([facility]): [address], [phone]           [facility]: [address], [phone]                [facility] ([facility]): [address], [phone] (call to ask if accepting patients) [facility]: [address], [place], [phone]    [place] [facility]/Family Doctors [facility] ([facility]): [address], [phone] [facility]: [address], [phone] [facility]: [address], [phone]  [place] [facility]  [facility] ([facility]): [phone] [facility]: [address], [phone]   [place] [facility]: [address], [phone]  [place] [facility]  [facility]: [address], [phone]  Home Blood Pressure Monitoring for Patients   Your provider has recommended that you check your   blood pressure (BP) at least once a week at home. If you do not have a blood pressure cuff at home, one will be provided for you. Contact your provider if you have not received your monitor within 1 week.   Helpful Tips for Accurate Home Blood Pressure Checks  Don't smoke, exercise, or drink caffeine 30 minutes before checking your BP Use the restroom before checking your BP (a full bladder can raise your  pressure) Relax in a comfortable upright chair Feet on the ground Left arm resting comfortably on a flat surface at the level of your heart Legs uncrossed Back supported Sit quietly and don't talk Place the cuff on your bare arm Adjust snuggly, so that only two fingertips can fit between your skin and the top of the cuff Check 2 readings separated by at least one minute Keep a log of your BP readings For a visual, please reference this diagram: http://ccnc.care/bpdiagram  Provider Name: [facility]     Phone: [phone]  Zone 1: ALL CLEAR  Continue to monitor your symptoms:  BP reading is less than 140 (top number) or less than 90 (bottom number)  No right upper stomach pain No headaches or seeing spots No feeling nauseated or throwing up No swelling in face and hands  Zone 2: CAUTION Call your doctor's office for any of the following:  BP reading is greater than 140 (top number) or greater than 90 (bottom number)  Stomach pain under your ribs in the middle or right side Headaches or seeing spots Feeling nauseated or throwing up Swelling in face and hands  Zone 3: EMERGENCY  Seek immediate medical care if you have any of the following:  BP reading is greater than160 (top number) or greater than 110 (bottom number) Severe headaches not improving with Tylenol Serious difficulty catching your breath Any worsening symptoms from Zone 2  Preterm Labor and Birth Information  The normal length of a pregnancy is 39-41 weeks. Preterm labor is when labor starts before 37 completed weeks of pregnancy. What are the risk factors for preterm labor? Preterm labor is more likely to occur in women who: Have certain infections during pregnancy such as a bladder infection, sexually transmitted infection, or infection inside the uterus (chorioamnionitis). Have a shorter-than-normal cervix. Have gone into preterm labor before. Have had surgery on their cervix. Are younger than age 17  or older than age 35. Are African American. Are pregnant with twins or multiple babies (multiple gestation). Take street drugs or smoke while pregnant. Do not gain enough weight while pregnant. Became pregnant shortly after having been pregnant. What are the symptoms of preterm labor? Symptoms of preterm labor include: Cramps similar to those that can happen during a menstrual period. The cramps may happen with diarrhea. Pain in the abdomen or lower back. Regular uterine contractions that may feel like tightening of the abdomen. A feeling of increased pressure in the pelvis. Increased watery or bloody mucus discharge from the vagina. Water breaking (ruptured amniotic sac). Why is it important to recognize signs of preterm labor? It is important to recognize signs of preterm labor because babies who are born prematurely may not be fully developed. This can put them at an increased risk for: Long-term (chronic) heart and lung problems. Difficulty immediately after birth with regulating body systems, including blood sugar, body temperature, heart rate, and breathing rate. Bleeding in the brain. Cerebral palsy. Learning difficulties. Death. These risks are highest for babies who are born before 34 weeks   of pregnancy. How is preterm labor treated? Treatment depends on the length of your pregnancy, your condition, and the health of your baby. It may involve: Having a stitch (suture) placed in your cervix to prevent your cervix from opening too early (cerclage). Taking or being given medicines, such as: Hormone medicines. These may be given early in pregnancy to help support the pregnancy. Medicine to stop contractions. Medicines to help mature the baby's lungs. These may be prescribed if the risk of delivery is high. Medicines to prevent your baby from developing cerebral palsy. If the labor happens before 34 weeks of pregnancy, you may need to stay in the hospital. What should I do if I  think I am in preterm labor? If you think that you are going into preterm labor, call your health care provider right away. How can I prevent preterm labor in future pregnancies? To increase your chance of having a full-term pregnancy: Do not use any tobacco products, such as cigarettes, chewing tobacco, and e-cigarettes. If you need help quitting, ask your health care provider. Do not use street drugs or medicines that have not been prescribed to you during your pregnancy. Talk with your health care provider before taking any herbal supplements, even if you have been taking them regularly. Make sure you gain a healthy amount of weight during your pregnancy. Watch for infection. If you think that you might have an infection, get it checked right away. Make sure to tell your health care provider if you have gone into preterm labor before. This information is not intended to replace advice given to you by your health care provider. Make sure you discuss any questions you have with your health care provider. Document Revised: 05/01/2018 Document Reviewed: 05/31/2015 Elsevier Patient Education  2020 Elsevier Inc.   

## 2022-04-29 NOTE — Progress Notes (Signed)
LOW-RISK PREGNANCY VISIT Patient name: Lisa Christian MRN [id number]  Date of birth: Nov 13, 1990 Chief Complaint:   Routine Prenatal Visit (Ultrasound, cultures)  History of Present Illness:   Lisa Christian is a 32 y.o. G76P1001 female at [redacted]w[redacted]d with an Estimated Date of Delivery: 05/25/22 being seen today for ongoing management of a low-risk pregnancy.   Today she reports no complaints. Hemorrhoid much improved, but still there. Contractions: Irritability. Vag. Bleeding: None.  Movement: Present. denies leaking of fluid.     11/15/2021   11:04 AM 05/04/2021   10:48 AM 02/02/2018    2:46 PM  Depression screen PHQ 2/9  Decreased Interest 0 0 0  Down, Depressed, Hopeless 0 0 0  PHQ - 2 Score 0 0 0  Altered sleeping 0 1   Tired, decreased energy 0 0   Change in appetite 0 0   Feeling bad or failure about yourself  0 0   Trouble concentrating 0 0   Moving slowly or fidgety/restless 0 0   Suicidal thoughts 0 0   PHQ-9 Score 0 1         11/15/2021   11:04 AM 05/04/2021   10:49 AM  GAD 7 : Generalized Anxiety Score  Nervous, Anxious, on Edge 0 0  Control/stop worrying 0 0  Worry too much - different things 0 0  Trouble relaxing 1 0  Restless 0 0  Easily annoyed or irritable 0 0  Afraid - awful might happen 0 0  Total GAD 7 Score 1 0      Review of Systems:   Pertinent items are noted in HPI Denies abnormal vaginal discharge w/ itching/odor/irritation, headaches, visual changes, shortness of breath, chest pain, abdominal pain, severe nausea/vomiting, or problems with urination or bowel movements unless otherwise stated above. Pertinent History Reviewed:  Reviewed past medical,surgical, social, obstetrical and family history.  Reviewed problem list, medications and allergies. Physical Assessment:   Vitals:   04/29/22 1049  BP: 108/61  Pulse: 77  Weight: 181 lb 3.2 oz (82.2 kg)  Body mass index is 30.15 kg/m.        Physical Examination:   General  appearance: Well appearing, and in no distress  Mental status: Alert, oriented to person, place, and time  Skin: Warm & dry  Cardiovascular: Normal heart rate noted  Respiratory: Normal respiratory effort, no distress  Abdomen: Soft, gravid, nontender  Pelvic:  cultures obtained, declined SVE          Rectal: large non-thrombosed external hemorrhoid Extremities:  tr  Fetal Status: Fetal Heart Rate (bpm): 150u/s   Movement: Present  [place] 36+2 wks,cephalic,anterior placenta gr 3,FHR 150 bpm,FHR 17 cm,EFW 4161 g 99.9%   Chaperone: Faith Rogue   No results found for this or any previous visit (from the past 24 hour(s)).  Assessment & Plan:  1) Low-risk pregnancy G2P1001 at [redacted]w[redacted]d with an Estimated Date of Delivery: 05/25/22   2) Suspected LGA w/ h/o shoulder dystocia> 9lb0.4oz @ 40.2wks, mild, resolved by McRoberts + ?anterior sling, APGARS 9/9, no residual effects. EFW today >99.9%/4161g/9lb3oz @ 36.2wks, normal GTT- reports all babies on her side 9+lbs. Discussed w/ Dr. Despina Hidden, consider IOL @ 38wks vs PCS. Reviewed risks of vag birth w/ potential for another shoulder dystocia which could be anywhere from mild w/ no consequences to severe with potential risks including brachial nerve damage requiring PT, fx of clavicle/humerus, Zavanelli w/ stat c/s, potential fetal death vs PCS w/ potential surgical risks including infection, damage to  surrounding organs, hemorrhage. Pt wants to think about it and talk w/ husband. Will discuss further w/ MD next week.   3) Improving hemorrhoid  4) H/O L4/L5 laminectomy> per previous discussion w/ [facility] anesthesia ok for epidural    Meds: No orders of the defined types were placed in this encounter.  Labs/procedures today: GBS, GC/CT, and U/S  Plan:  Continue routine obstetrical care  Next visit: prefers in person    Reviewed: Preterm labor symptoms and general obstetric precautions including but not limited to vaginal bleeding, contractions, leaking of fluid  and fetal movement were reviewed in detail with the patient.  All questions were answered. Does have home bp cuff. Office bp cuff given: not applicable. Check bp weekly, let us know if consistently >140 and/or >90.  Follow-up: Return in about 1 week (around 05/06/2022) for LROB, MD only, in person.  Future Appointments  Date Time Provider Department Center  05/08/2022 10:50 AM Myna Hidalgo, DO [facility]  05/15/2022  4:10 PM Hermina Staggers, MD [facility]    Orders Placed This Encounter  Procedures   Culture, beta strep (group b only)   Tdap vaccine greater than or equal to 7yo IM   Cheral Marker CNM, [facility] 04/29/2022 1:01 PM

## 2022-04-29 NOTE — Progress Notes (Signed)
[place] 36+2 wks,cephalic,anterior placenta gr 3,FHR 150 bpm,FHR 17 cm,EFW 4161 g 99.9%

## 2022-04-30 LAB — CERVICOVAGINAL ANCILLARY ONLY
Chlamydia: NEGATIVE
Comment: NEGATIVE
Comment: NORMAL
Neisseria Gonorrhea: NEGATIVE

## 2022-05-03 LAB — CULTURE, BETA STREP (GROUP B ONLY): Strep Gp B Culture: NEGATIVE

## 2022-05-08 ENCOUNTER — Ambulatory Visit (INDEPENDENT_AMBULATORY_CARE_PROVIDER_SITE_OTHER): Payer: BLUE CROSS/BLUE SHIELD | Admitting: Obstetrics & Gynecology

## 2022-05-08 ENCOUNTER — Encounter: Payer: Self-pay | Admitting: Obstetrics & Gynecology

## 2022-05-08 VITALS — BP 113/66 | HR 78 | Wt 181.8 lb

## 2022-05-08 DIAGNOSIS — O3663X1 Maternal care for excessive fetal growth, third trimester, fetus 1: Secondary | ICD-10-CM

## 2022-05-08 DIAGNOSIS — Z3483 Encounter for supervision of other normal pregnancy, third trimester: Secondary | ICD-10-CM

## 2022-05-08 DIAGNOSIS — Z3A37 37 weeks gestation of pregnancy: Secondary | ICD-10-CM

## 2022-05-08 DIAGNOSIS — Z8759 Personal history of other complications of pregnancy, childbirth and the puerperium: Secondary | ICD-10-CM

## 2022-05-08 NOTE — Progress Notes (Signed)
HIGH-RISK PREGNANCY VISIT Patient name: Lisa Christian MRN [id number]  Date of birth: 1991-01-17 Chief Complaint:   Routine Prenatal Visit  History of Present Illness:   Lisa Christian is a 32 y.o. G69P1001 female at [redacted]w[redacted]d with an Estimated Date of Delivery: 05/25/22 being seen today for ongoing management of a high-risk pregnancy complicated by :  -h/o shoulder dystocia and LGA: -ultrasound at 36 weeks showed greater than 99th percentile -Patient feels as though this baby is bigger than her last  Today she reports no complaints.   Contractions: Not present. Vag. Bleeding: None.  Movement: Present. denies leaking of fluid.      11/15/2021   11:04 AM 05/04/2021   10:48 AM 02/02/2018    2:46 PM  Depression screen PHQ 2/9  Decreased Interest 0 0 0  Down, Depressed, Hopeless 0 0 0  PHQ - 2 Score 0 0 0  Altered sleeping 0 1   Tired, decreased energy 0 0   Change in appetite 0 0   Feeling bad or failure about yourself  0 0   Trouble concentrating 0 0   Moving slowly or fidgety/restless 0 0   Suicidal thoughts 0 0   PHQ-9 Score 0 1      Current Outpatient Medications  Medication Instructions   hydrocortisone (ANUSOL-HC) 25 mg, Rectal, 2 times daily   nitrofurantoin (macrocrystal-monohydrate) (MACROBID) 100 mg, Oral, 2 times daily, X 7 days   ondansetron (ZOFRAN-ODT) 4 mg, Oral, Every 6 hours PRN   Prenatal MV & Min w/FA-DHA (PRENATAL ADULT GUMMY/DHA/FA PO) Oral, 2 gummies daily      Review of Systems:   Pertinent items are noted in HPI Denies abnormal vaginal discharge w/ itching/odor/irritation, headaches, visual changes, shortness of breath, chest pain, abdominal pain, severe nausea/vomiting, or problems with urination or bowel movements unless otherwise stated above. Pertinent History Reviewed:  Reviewed past medical,surgical, social, obstetrical and family history.  Reviewed problem list, medications and allergies. Physical Assessment:   Vitals:   05/08/22  1110  BP: 113/66  Pulse: 78  Weight: 181 lb 12.8 oz (82.5 kg)  Body mass index is 30.25 kg/m.           Physical Examination:   General appearance: alert, well appearing, and in no distress  Mental status: normal mood, behavior, speech, dress, motor activity, and thought processes  Skin: warm & dry   Extremities:      Cardiovascular: normal heart rate noted  Respiratory: normal respiratory effort, no distress  Abdomen: gravid, soft, non-tender  Pelvic: Cervical exam deferred         Fetal Status: Fetal Heart Rate (bpm): 150 Fundal Height: 40 cm Movement: Present    Fetal Surveillance Testing today: doppler   Chaperone: N/A    No results found for this or any previous visit (from the past 24 hour(s)).   Assessment & Plan:  High-risk pregnancy: G2P1001 at [redacted]w[redacted]d with an Estimated Date of Delivery: 05/25/22   1) h/o shoulder dystocia and LGA -Reviewed risks benefits of vaginal delivery versus primary C-section Discussed potential recurrence of shoulder dystocia and related complications.  Also reviewed that unfortunately there is no way to predict if that may happen -Reviewed risk benefits of primary C-section including but not limited to risk of bleeding infection and injury - After much discussion patient desires to proceed with primary C-section -Questions and concerns were addressed  -Referral created for April 30 primary C-section  Meds: No orders of the defined types were placed in this  encounter.   Labs/procedures today: Doppler  Treatment Plan: As outlined above  Reviewed: Term labor symptoms and general obstetric precautions including but not limited to vaginal bleeding, contractions, leaking of fluid and fetal movement were reviewed in detail with the patient.  All questions were answered. Pt has home bp cuff. Check bp weekly, let us know if >140/90.   Follow-up: Return in about 1 week (around 05/15/2022) for LROB visit.   Future Appointments  Date Time Provider  Department Center  05/15/2022  4:10 PM Hermina Staggers, MD [facility]    No orders of the defined types were placed in this encounter.   Myna Hidalgo, DO Attending Obstetrician & Gynecologist, [facility] for [employer], [facility]

## 2022-05-13 ENCOUNTER — Telehealth (HOSPITAL_COMMUNITY): Payer: Self-pay | Admitting: *Deleted

## 2022-05-13 ENCOUNTER — Encounter (HOSPITAL_COMMUNITY): Payer: Self-pay

## 2022-05-13 NOTE — Patient Instructions (Signed)
Lisa Christian  05/13/2022   Your procedure is scheduled on:  05/22/2022  Arrive at 0730 at [employer] [facility] on [address] at [facility]  and [employer]. You are invited to use the FREE valet parking or use the Visitor's parking deck.  Pick up the phone at the desk and dial [phone].  Call this number if you have problems the morning of surgery: [phone]  Remember:   Do not eat food:(After Midnight) Desps de medianoche.  Do not drink clear liquids: (After Midnight) Desps de medianoche.  Take these medicines the morning of surgery with A SIP OF WATER:  none   Do not wear jewelry, make-up or nail polish.  Do not wear lotions, powders, or perfumes. Do not wear deodorant.  Do not shave 48 hours prior to surgery.  Do not bring valuables to the hospital.  [facility] is not   responsible for any belongings or valuables brought to the hospital.  Contacts, dentures or bridgework may not be worn into surgery.  Leave suitcase in the car. After surgery it may be brought to your room.  For patients admitted to the hospital, checkout time is 11:00 AM the day of              discharge.      Please read over the following fact sheets that you were given:     Preparing for Surgery

## 2022-05-13 NOTE — Telephone Encounter (Signed)
Preadmission screen  

## 2022-05-14 ENCOUNTER — Encounter (HOSPITAL_COMMUNITY): Payer: Self-pay

## 2022-05-14 ENCOUNTER — Other Ambulatory Visit: Payer: Self-pay | Admitting: Obstetrics and Gynecology

## 2022-05-14 DIAGNOSIS — Z3480 Encounter for supervision of other normal pregnancy, unspecified trimester: Secondary | ICD-10-CM

## 2022-05-15 ENCOUNTER — Ambulatory Visit (INDEPENDENT_AMBULATORY_CARE_PROVIDER_SITE_OTHER): Payer: BLUE CROSS/BLUE SHIELD | Admitting: Obstetrics and Gynecology

## 2022-05-15 ENCOUNTER — Encounter: Payer: Self-pay | Admitting: Obstetrics and Gynecology

## 2022-05-15 VITALS — BP 119/72 | HR 83 | Wt 182.0 lb

## 2022-05-15 DIAGNOSIS — Z3483 Encounter for supervision of other normal pregnancy, third trimester: Secondary | ICD-10-CM

## 2022-05-15 DIAGNOSIS — Z3480 Encounter for supervision of other normal pregnancy, unspecified trimester: Secondary | ICD-10-CM

## 2022-05-15 DIAGNOSIS — Z3A38 38 weeks gestation of pregnancy: Secondary | ICD-10-CM

## 2022-05-15 NOTE — Progress Notes (Signed)
Subjective:  Lisa Christian is a 32 y.o. G2P1001 at [redacted]w[redacted]d being seen today for ongoing prenatal care.  She is currently monitored for the following issues for this high-risk pregnancy and has GERD (gastroesophageal reflux disease); Abnormal Pap smear of cervix; Shoulder dystocia during labor and delivery, delivered; HNP (herniated nucleus pulposus), lumbar; Encounter for supervision of normal pregnancy, antepartum; SVT (supraventricular tachycardia); Nausea vomiting and diarrhea; Mild mitral & pulmonic regurgitation; and Gallbladder polyp on their problem list.  Patient reports  general discomforts of pregnancy .  Contractions: Not present. Vag. Bleeding: None.  Movement: Present. Denies leaking of fluid.   The following portions of the patient's history were reviewed and updated as appropriate: allergies, current medications, past family history, past medical history, past social history, past surgical history and problem list. Problem list updated.  Objective:   Vitals:   05/15/22 1622  BP: 119/72  Pulse: 83  Weight: 182 lb (82.6 kg)    Fetal Status:     Movement: Present     General:  Alert, oriented and cooperative. Patient is in no acute distress.  Skin: Skin is warm and dry. No rash noted.   Cardiovascular: Normal heart rate noted  Respiratory: Normal respiratory effort, no problems with respiration noted  Abdomen: Soft, gravid, appropriate for gestational age. Pain/Pressure: Absent     Pelvic:  Cervical exam deferred        Extremities: Normal range of motion.     Mental Status: Normal mood and affect. Normal behavior. Normal judgment and thought content.   Urinalysis:      Assessment and Plan:  Pregnancy: G2P1001 at [redacted]w[redacted]d  1. Supervision of other normal pregnancy, antepartum Stable Labor precautions reviewed  2. Shoulder dystocia during labor and delivery, delivered For c section 05/22/22  Term labor symptoms and general obstetric precautions including but not  limited to vaginal bleeding, contractions, leaking of fluid and fetal movement were reviewed in detail with the patient. Please refer to After Visit Summary for other counseling recommendations.  No follow-ups on file.   Hermina Staggers, MD

## 2022-05-20 ENCOUNTER — Encounter (HOSPITAL_COMMUNITY): Payer: Self-pay

## 2022-05-20 ENCOUNTER — Encounter (HOSPITAL_COMMUNITY)
Admission: RE | Admit: 2022-05-20 | Discharge: 2022-05-20 | Disposition: A | Discharge status: Home / Self Care | Payer: BLUE CROSS/BLUE SHIELD | Source: Ambulatory Visit | Attending: Obstetrics and Gynecology | Admitting: Obstetrics and Gynecology

## 2022-05-20 ENCOUNTER — Other Ambulatory Visit: Payer: Self-pay

## 2022-05-20 DIAGNOSIS — Z3A39 39 weeks gestation of pregnancy: Secondary | ICD-10-CM | POA: Insufficient documentation

## 2022-05-20 DIAGNOSIS — Z3480 Encounter for supervision of other normal pregnancy, unspecified trimester: Secondary | ICD-10-CM

## 2022-05-20 DIAGNOSIS — K824 Cholesterolosis of gallbladder: Secondary | ICD-10-CM

## 2022-05-20 DIAGNOSIS — Z3483 Encounter for supervision of other normal pregnancy, third trimester: Secondary | ICD-10-CM | POA: Insufficient documentation

## 2022-05-20 HISTORY — DX: Other complications of anesthesia, initial encounter: T88.59XA

## 2022-05-20 LAB — CBC
HCT: 32.2 % — ABNORMAL LOW (ref 36.0–46.0)
Hemoglobin: 10.3 g/dL — ABNORMAL LOW (ref 12.0–15.0)
MCH: 28.9 pg (ref 26.0–34.0)
MCHC: 32 g/dL (ref 30.0–36.0)
MCV: 90.4 fL (ref 80.0–100.0)
Platelets: 213 10*3/uL (ref 150–400)
RBC: 3.56 MIL/uL — ABNORMAL LOW (ref 3.87–5.11)
RDW: 13.1 % (ref 11.5–15.5)
WBC: 10.4 10*3/uL (ref 4.0–10.5)
nRBC: 0 % (ref 0.0–0.2)

## 2022-05-21 LAB — RPR: RPR Ser Ql: NONREACTIVE

## 2022-05-22 ENCOUNTER — Inpatient Hospital Stay (HOSPITAL_COMMUNITY)
Admission: RE | Admit: 2022-05-22 | Discharge: 2022-05-25 | DRG: 786 | Disposition: A | Discharge status: Home / Self Care | Payer: BLUE CROSS/BLUE SHIELD | Attending: Obstetrics and Gynecology | Admitting: Obstetrics and Gynecology

## 2022-05-22 ENCOUNTER — Inpatient Hospital Stay (HOSPITAL_COMMUNITY): Payer: BLUE CROSS/BLUE SHIELD | Admitting: Anesthesiology

## 2022-05-22 ENCOUNTER — Encounter (HOSPITAL_COMMUNITY)
Admission: RE | Disposition: A | Discharge status: Home / Self Care | Payer: Self-pay | Source: Home / Self Care | Attending: Obstetrics and Gynecology

## 2022-05-22 ENCOUNTER — Other Ambulatory Visit: Payer: Self-pay

## 2022-05-22 ENCOUNTER — Encounter (HOSPITAL_COMMUNITY): Payer: Self-pay | Admitting: Obstetrics and Gynecology

## 2022-05-22 DIAGNOSIS — O9902 Anemia complicating childbirth: Secondary | ICD-10-CM | POA: Diagnosis present

## 2022-05-22 DIAGNOSIS — Z87891 Personal history of nicotine dependence: Secondary | ICD-10-CM | POA: Diagnosis not present

## 2022-05-22 DIAGNOSIS — I959 Hypotension, unspecified: Secondary | ICD-10-CM | POA: Diagnosis not present

## 2022-05-22 DIAGNOSIS — D62 Acute posthemorrhagic anemia: Secondary | ICD-10-CM | POA: Diagnosis not present

## 2022-05-22 DIAGNOSIS — O3663X0 Maternal care for excessive fetal growth, third trimester, not applicable or unspecified: Secondary | ICD-10-CM | POA: Diagnosis present

## 2022-05-22 DIAGNOSIS — Z98891 History of uterine scar from previous surgery: Principal | ICD-10-CM

## 2022-05-22 DIAGNOSIS — M5126 Other intervertebral disc displacement, lumbar region: Secondary | ICD-10-CM | POA: Diagnosis present

## 2022-05-22 DIAGNOSIS — I471 Supraventricular tachycardia, unspecified: Secondary | ICD-10-CM | POA: Diagnosis present

## 2022-05-22 DIAGNOSIS — Z3490 Encounter for supervision of normal pregnancy, unspecified, unspecified trimester: Secondary | ICD-10-CM

## 2022-05-22 DIAGNOSIS — R001 Bradycardia, unspecified: Secondary | ICD-10-CM | POA: Diagnosis not present

## 2022-05-22 DIAGNOSIS — O9942 Diseases of the circulatory system complicating childbirth: Secondary | ICD-10-CM | POA: Diagnosis present

## 2022-05-22 DIAGNOSIS — Z3A39 39 weeks gestation of pregnancy: Secondary | ICD-10-CM

## 2022-05-22 DIAGNOSIS — O09823 Supervision of pregnancy with history of in utero procedure during previous pregnancy, third trimester: Secondary | ICD-10-CM

## 2022-05-22 DIAGNOSIS — O99892 Other specified diseases and conditions complicating childbirth: Secondary | ICD-10-CM | POA: Diagnosis present

## 2022-05-22 LAB — ABO/RH: ABO/RH(D): B POS

## 2022-05-22 SURGERY — Surgical Case
Anesthesia: Spinal

## 2022-05-22 MED ORDER — KETOROLAC TROMETHAMINE 30 MG/ML IJ SOLN
30.0000 mg | Freq: Four times a day (QID) | INTRAMUSCULAR | Status: DC | PRN
  Administered 2022-05-22: 30 mg via INTRAVENOUS
  Filled 2022-05-22: qty 1

## 2022-05-22 MED ORDER — OXYTOCIN-SODIUM CHLORIDE 30-0.9 UT/500ML-% IV SOLN
2.5000 [IU]/h | INTRAVENOUS | Status: AC
  Administered 2022-05-22: 2.5 [IU]/h via INTRAVENOUS
  Filled 2022-05-22: qty 500

## 2022-05-22 MED ORDER — SENNOSIDES-DOCUSATE SODIUM 8.6-50 MG PO TABS
2.0000 | ORAL_TABLET | ORAL | Status: DC
  Administered 2022-05-22 – 2022-05-23 (×2): 2 via ORAL
  Filled 2022-05-22 (×2): qty 2

## 2022-05-22 MED ORDER — DIPHENHYDRAMINE HCL 25 MG PO CAPS: 25.0000 mg | ORAL_CAPSULE | Freq: Four times a day (QID) | ORAL | Status: DC | PRN

## 2022-05-22 MED ORDER — LACTATED RINGERS IV SOLN: INTRAVENOUS | Status: DC

## 2022-05-22 MED ORDER — FENTANYL CITRATE (PF) 100 MCG/2ML IJ SOLN
INTRAMUSCULAR | Status: DC | PRN
  Administered 2022-05-22: 85 ug via INTRAVENOUS
  Administered 2022-05-22: 100 ug via INTRAVENOUS

## 2022-05-22 MED ORDER — MORPHINE SULFATE (PF) 0.5 MG/ML IJ SOLN
INTRAMUSCULAR | Status: AC
  Filled 2022-05-22: qty 10

## 2022-05-22 MED ORDER — ACETAMINOPHEN 160 MG/5ML PO SOLN
650.0000 mg | Freq: Four times a day (QID) | ORAL | Status: DC
  Administered 2022-05-23 – 2022-05-25 (×9): 650 mg via ORAL
  Filled 2022-05-22 (×9): qty 20.3

## 2022-05-22 MED ORDER — MEASLES, MUMPS & RUBELLA VAC IJ SOLR: 0.5000 mL | Freq: Once | INTRAMUSCULAR | Status: DC

## 2022-05-22 MED ORDER — SCOPOLAMINE 1 MG/3DAYS TD PT72
1.0000 | MEDICATED_PATCH | Freq: Once | TRANSDERMAL | Status: DC
  Administered 2022-05-22: 1.5 mg via TRANSDERMAL

## 2022-05-22 MED ORDER — DEXAMETHASONE SODIUM PHOSPHATE 10 MG/ML IJ SOLN
INTRAMUSCULAR | Status: AC
  Filled 2022-05-22: qty 1

## 2022-05-22 MED ORDER — BUPIVACAINE IN DEXTROSE 0.75-8.25 % IT SOLN
INTRATHECAL | Status: DC | PRN
  Administered 2022-05-22: 1.6 mL via INTRATHECAL

## 2022-05-22 MED ORDER — FENTANYL CITRATE (PF) 100 MCG/2ML IJ SOLN
INTRAMUSCULAR | Status: AC
  Filled 2022-05-22: qty 2

## 2022-05-22 MED ORDER — KETOROLAC TROMETHAMINE 30 MG/ML IJ SOLN
INTRAMUSCULAR | Status: AC
  Filled 2022-05-22: qty 1

## 2022-05-22 MED ORDER — KETOROLAC TROMETHAMINE 30 MG/ML IJ SOLN
30.0000 mg | Freq: Once | INTRAMUSCULAR | Status: AC | PRN
  Administered 2022-05-22: 30 mg via INTRAVENOUS

## 2022-05-22 MED ORDER — KETOROLAC TROMETHAMINE 30 MG/ML IJ SOLN: 30.0000 mg | Freq: Four times a day (QID) | INTRAMUSCULAR | Status: DC | PRN

## 2022-05-22 MED ORDER — DIPHENHYDRAMINE HCL 50 MG/ML IJ SOLN: 12.5000 mg | INTRAMUSCULAR | Status: DC | PRN

## 2022-05-22 MED ORDER — SIMETHICONE 80 MG PO CHEW
80.0000 mg | CHEWABLE_TABLET | Freq: Three times a day (TID) | ORAL | Status: DC
  Administered 2022-05-22 – 2022-05-25 (×9): 80 mg via ORAL
  Filled 2022-05-22 (×9): qty 1

## 2022-05-22 MED ORDER — MORPHINE SULFATE (PF) 0.5 MG/ML IJ SOLN
INTRAMUSCULAR | Status: DC | PRN
  Administered 2022-05-22: 150 ug via INTRATHECAL

## 2022-05-22 MED ORDER — OXYCODONE HCL 5 MG PO TABS: 5.0000 mg | ORAL_TABLET | ORAL | Status: DC | PRN

## 2022-05-22 MED ORDER — SODIUM CHLORIDE 0.9% FLUSH: 3.0000 mL | INTRAVENOUS | Status: DC | PRN

## 2022-05-22 MED ORDER — ACETAMINOPHEN 10 MG/ML IV SOLN
INTRAVENOUS | Status: AC
  Filled 2022-05-22: qty 100

## 2022-05-22 MED ORDER — PRENATAL MULTIVITAMIN CH
1.0000 | ORAL_TABLET | Freq: Every day | ORAL | Status: DC
  Filled 2022-05-22 (×3): qty 1

## 2022-05-22 MED ORDER — STERILE WATER FOR IRRIGATION IR SOLN
Status: DC | PRN
  Administered 2022-05-22: 1000 mL

## 2022-05-22 MED ORDER — PHENYLEPHRINE HCL-NACL 20-0.9 MG/250ML-% IV SOLN
INTRAVENOUS | Status: DC | PRN
  Administered 2022-05-22: 60 ug/min via INTRAVENOUS

## 2022-05-22 MED ORDER — CEFAZOLIN SODIUM-DEXTROSE 2-4 GM/100ML-% IV SOLN
2.0000 g | INTRAVENOUS | Status: AC
  Administered 2022-05-22: 2 g via INTRAVENOUS

## 2022-05-22 MED ORDER — LACTATED RINGERS IV BOLUS
1000.0000 mL | Freq: Once | INTRAVENOUS | Status: AC
  Administered 2022-05-22: 1000 mL via INTRAVENOUS

## 2022-05-22 MED ORDER — SIMETHICONE 80 MG PO CHEW: 80.0000 mg | CHEWABLE_TABLET | ORAL | Status: DC | PRN

## 2022-05-22 MED ORDER — ZOLPIDEM TARTRATE 5 MG PO TABS: 5.0000 mg | ORAL_TABLET | Freq: Every evening | ORAL | Status: DC | PRN

## 2022-05-22 MED ORDER — IBUPROFEN 100 MG/5ML PO SUSP
800.0000 mg | Freq: Four times a day (QID) | ORAL | Status: DC | PRN
  Administered 2022-05-23 (×2): 800 mg via ORAL
  Filled 2022-05-22 (×2): qty 40

## 2022-05-22 MED ORDER — SCOPOLAMINE 1 MG/3DAYS TD PT72
MEDICATED_PATCH | TRANSDERMAL | Status: AC
  Filled 2022-05-22: qty 1

## 2022-05-22 MED ORDER — POVIDONE-IODINE 10 % EX SWAB
2.0000 | Freq: Once | CUTANEOUS | Status: AC
  Administered 2022-05-22: 2 via TOPICAL

## 2022-05-22 MED ORDER — DIPHENHYDRAMINE HCL 25 MG PO CAPS: 25.0000 mg | ORAL_CAPSULE | ORAL | Status: DC | PRN

## 2022-05-22 MED ORDER — WITCH HAZEL-GLYCERIN EX PADS: 1.0000 | MEDICATED_PAD | CUTANEOUS | Status: DC | PRN

## 2022-05-22 MED ORDER — ONDANSETRON HCL 4 MG/2ML IJ SOLN
INTRAMUSCULAR | Status: DC | PRN
  Administered 2022-05-22: 4 mg via INTRAVENOUS

## 2022-05-22 MED ORDER — NALOXONE HCL 0.4 MG/ML IJ SOLN: 0.4000 mg | INTRAMUSCULAR | Status: DC | PRN

## 2022-05-22 MED ORDER — DEXMEDETOMIDINE HCL IN NACL 80 MCG/20ML IV SOLN
INTRAVENOUS | Status: DC | PRN
  Administered 2022-05-22: 8 ug via INTRAVENOUS
  Administered 2022-05-22: 4 ug via INTRAVENOUS
  Administered 2022-05-22: 8 ug via INTRAVENOUS
  Administered 2022-05-22: 4 ug via INTRAVENOUS

## 2022-05-22 MED ORDER — ACETAMINOPHEN 500 MG PO TABS: 1000.0000 mg | ORAL_TABLET | Freq: Four times a day (QID) | ORAL | Status: DC

## 2022-05-22 MED ORDER — IBUPROFEN 600 MG PO TABS
600.0000 mg | ORAL_TABLET | Freq: Four times a day (QID) | ORAL | Status: DC
  Filled 2022-05-22: qty 1

## 2022-05-22 MED ORDER — DIBUCAINE (PERIANAL) 1 % EX OINT: 1.0000 | TOPICAL_OINTMENT | CUTANEOUS | Status: DC | PRN

## 2022-05-22 MED ORDER — CEFAZOLIN SODIUM-DEXTROSE 2-4 GM/100ML-% IV SOLN
INTRAVENOUS | Status: AC
  Filled 2022-05-22: qty 100

## 2022-05-22 MED ORDER — ACETAMINOPHEN 500 MG PO TABS
1000.0000 mg | ORAL_TABLET | Freq: Four times a day (QID) | ORAL | Status: DC
  Administered 2022-05-22: 1000 mg via ORAL
  Filled 2022-05-22: qty 2

## 2022-05-22 MED ORDER — OXYTOCIN-SODIUM CHLORIDE 30-0.9 UT/500ML-% IV SOLN
INTRAVENOUS | Status: DC | PRN
  Administered 2022-05-22: 30 [IU] via INTRAVENOUS

## 2022-05-22 MED ORDER — ONDANSETRON HCL 4 MG/2ML IJ SOLN: 4.0000 mg | Freq: Three times a day (TID) | INTRAMUSCULAR | Status: DC | PRN

## 2022-05-22 MED ORDER — FENTANYL CITRATE (PF) 100 MCG/2ML IJ SOLN
25.0000 ug | INTRAMUSCULAR | Status: DC | PRN
  Administered 2022-05-22: 50 ug via INTRAVENOUS

## 2022-05-22 MED ORDER — FENTANYL CITRATE (PF) 100 MCG/2ML IJ SOLN
INTRAMUSCULAR | Status: DC | PRN
  Administered 2022-05-22: 15 ug via INTRATHECAL

## 2022-05-22 MED ORDER — CHLOROPROCAINE HCL (PF) 3 % IJ SOLN
INTRAMUSCULAR | Status: AC
  Filled 2022-05-22: qty 20

## 2022-05-22 MED ORDER — ACETAMINOPHEN 160 MG/5ML PO SOLN: 650.0000 mg | Freq: Four times a day (QID) | ORAL | Status: DC | PRN

## 2022-05-22 MED ORDER — DEXAMETHASONE SODIUM PHOSPHATE 10 MG/ML IJ SOLN
INTRAMUSCULAR | Status: DC | PRN
  Administered 2022-05-22: 10 mg via INTRAVENOUS

## 2022-05-22 MED ORDER — MENTHOL 3 MG MT LOZG: 1.0000 | LOZENGE | OROMUCOSAL | Status: DC | PRN

## 2022-05-22 MED ORDER — ONDANSETRON HCL 4 MG/2ML IJ SOLN
INTRAMUSCULAR | Status: AC
  Filled 2022-05-22: qty 2

## 2022-05-22 MED ORDER — ACETAMINOPHEN 10 MG/ML IV SOLN
INTRAVENOUS | Status: DC | PRN
  Administered 2022-05-22: 1000 mg via INTRAVENOUS

## 2022-05-22 MED ORDER — NALOXONE HCL 4 MG/10ML IJ SOLN: 1.0000 ug/kg/h | INTRAVENOUS | Status: DC | PRN

## 2022-05-22 MED ORDER — COCONUT OIL OIL
1.0000 | TOPICAL_OIL | Status: DC | PRN
  Administered 2022-05-22: 1 via TOPICAL

## 2022-05-22 MED ORDER — SODIUM CHLORIDE 0.9 % IR SOLN
Status: DC | PRN
  Administered 2022-05-22: 1

## 2022-05-22 SURGICAL SUPPLY — 35 items
APL PRP STRL LF DISP 70% ISPRP (MISCELLANEOUS) ×2
APL SKNCLS STERI-STRIP NONHPOA (GAUZE/BANDAGES/DRESSINGS) ×1
BENZOIN TINCTURE PRP APPL 2/3 (GAUZE/BANDAGES/DRESSINGS) ×2 IMPLANT
CHLORAPREP W/TINT 26 (MISCELLANEOUS) ×4 IMPLANT
CLAMP UMBILICAL CORD (MISCELLANEOUS) ×2 IMPLANT
CLOTH BEACON ORANGE TIMEOUT ST (SAFETY) ×2 IMPLANT
DRSG OPSITE POSTOP 4X10 (GAUZE/BANDAGES/DRESSINGS) ×2 IMPLANT
ELECT REM PT RETURN 9FT ADLT (ELECTROSURGICAL) ×1
ELECTRODE REM PT RTRN 9FT ADLT (ELECTROSURGICAL) ×2 IMPLANT
EXTRACTOR VACUUM M CUP 4 TUBE (SUCTIONS) IMPLANT
GAUZE PAD ABD 7.5X8 STRL (GAUZE/BANDAGES/DRESSINGS) IMPLANT
GAUZE SPONGE 4X4 12PLY STRL LF (GAUZE/BANDAGES/DRESSINGS) IMPLANT
GLOVE BIOGEL PI IND STRL 7.0 (GLOVE) ×4 IMPLANT
GLOVE BIOGEL PI IND STRL 7.5 (GLOVE) ×4 IMPLANT
GLOVE ECLIPSE 7.5 STRL STRAW (GLOVE) ×2 IMPLANT
GOWN STRL REUS W/TWL LRG LVL3 (GOWN DISPOSABLE) ×6 IMPLANT
KIT ABG SYR 3ML LUER SLIP (SYRINGE) IMPLANT
NDL HYPO 25X5/8 SAFETYGLIDE (NEEDLE) IMPLANT
NEEDLE HYPO 25X5/8 SAFETYGLIDE (NEEDLE) IMPLANT
NS IRRIG 1000ML POUR BTL (IV SOLUTION) ×2 IMPLANT
PACK C SECTION WH (CUSTOM PROCEDURE TRAY) ×2 IMPLANT
PAD OB MATERNITY 4.3X12.25 (PERSONAL CARE ITEMS) ×2 IMPLANT
RTRCTR C-SECT PINK 25CM LRG (MISCELLANEOUS) ×2 IMPLANT
STRIP CLOSURE SKIN 1/2X4 (GAUZE/BANDAGES/DRESSINGS) ×2 IMPLANT
SUT PLAIN 0 NONE (SUTURE) ×2 IMPLANT
SUT VIC AB 0 CT1 36 (SUTURE) ×6 IMPLANT
SUT VIC AB 0 CTX 36 (SUTURE) ×1
SUT VIC AB 0 CTX36XBRD ANBCTRL (SUTURE) ×2 IMPLANT
SUT VIC AB 2-0 CT1 27 (SUTURE) ×1
SUT VIC AB 2-0 CT1 TAPERPNT 27 (SUTURE) ×2 IMPLANT
SUT VIC AB 4-0 KS 27 (SUTURE) ×2 IMPLANT
TAPE CLOTH SURG 4X10 WHT LF (GAUZE/BANDAGES/DRESSINGS) IMPLANT
TOWEL OR 17X24 6PK STRL BLUE (TOWEL DISPOSABLE) ×2 IMPLANT
TRAY FOLEY W/BAG SLVR 14FR LF (SET/KITS/TRAYS/PACK) ×2 IMPLANT
WATER STERILE IRR 1000ML POUR (IV SOLUTION) ×2 IMPLANT

## 2022-05-22 NOTE — Op Note (Signed)
Cesarean Section Operative Report  Lisa Christian  05/22/2022  Indications: large for gestational age fetus (EFW greater than 5000 g), history prior shoulder dystocia  Pre-operative Diagnosis: large for gestational age infant, primary low transverse cesarean section  Post-operative Diagnosis: Same   Surgeon: Surgeon(s) and Role:    * Sameeha Rockefeller, Wilfred Curtis, MD - Primary    * Nobie Putnam, Cyndi Lennert, MD - Assisting   Attending Attestation: I was present and scrubbed for the entire procedure.   An experienced assistant was required given the standard of surgical care given the complexity of the case.  This assistant was needed for exposure, dissection, suctioning, retraction, instrument exchange, assisting with delivery with administration of fundal pressure, and for overall help during the procedure.  Anesthesia: spinal    Quantified Blood Loss: 550 ml  Total IV Fluids: 1500 ml LR  Urine Output:: 300 ml clear yellow urine  Specimens: none  Findings: Viable female infant in cephalic presentation; Apgars pending; weight pending; arterial cord pH not obtained;  clear amniotic fluid; intact placenta with three vessel cord; normal uterus, fallopian tubes and ovaries bilaterally.  Baby condition / location:  Couplet care / Skin to Skin   Complications: no complications  Indications: Lisa Christian is a 32 y.o. G2P1001 with an IUP [redacted]w[redacted]d presenting for scheduled elective primary cesarean for suspected LGA with a history of prior shoulder dystocia..  The risks, benefits, complications, treatment options, and exected outcomes were discussed with the patient . The patient dwith the proposed plan, giving informed consent. identified as Lisa Christian and the procedure verified as C-Section Delivery.  Procedure Details:  The patient was taken back to the operative suite where spinal anesthesia was placed.  A time out was held and the above information confirmed.   After induction of  anesthesia, the patient was draped and prepped in the usual sterile manner and placed in a dorsal supine position with a leftward tilt. A Pfannenstiel incision was made and carried down through the subcutaneous tissue to the fascia. Fascial incision was made and bluntly extended transversely. The fascia was separated from the underlying rectus tissue superiorly and inferiorly. The peritoneum was identified and bluntly entered and extended longitudinally. Alexis retractor was placed. A bladder flap was not created. A low transverse uterine incision was made and extended bluntly. Delivered from cephalic presentation was a viable infant with Apgars and weight as above.  After waiting 60 seconds for delayed cord cutting, the umbilical cord was clamped and cut cord blood was obtained for evaluation. Cord ph was not sent. The placenta was removed Intact and appeared normal. The uterine outline, tubes and ovaries appeared normal. The uterine incision was closed with running unlocked sutures 0-Vicryl in one laye.   Hemostasis was observed. 20 ml of 3% chlorprocaine was poured in the abdomen on the right per anesthesia for pain there. The peritoneum was not closed. The rectus muscles were examined and hemostasis observed after bovie cautery. The fascia was then reapproximated with running sutures of 0-Vicryl. The subcuticular closure was performed with 2-0 plain gut. The skin was closed with 4-0 Vicryl.  Instrument, sponge, and needle counts were correct prior the abdominal closure and were correct at the conclusion of the case.     Disposition: PACU - hemodynamically stable.   Maternal Condition: stable       Signed: Cherrie Gauze WoukMD 05/22/2022 11:35 AM

## 2022-05-22 NOTE — Progress Notes (Signed)
Went to bedside to discuss patient's ongoing bradycardic episodes.   Patient denies any chest pain or current shortness of breath, dizziness, headache.  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  She has seen cardiology 2 times in the last 10 years.  She saw Dr. Odis Hollingshead in March 2023 for palpitations and precordial chest pain.  Her workup at that time included an echo, EKG, stress test and Holter monitor.  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  Though this was not noted in the cardiology encounter, patient notes that she was told her bradycardia may be a response to stress.  She had not had any bradycardic episodes until now in this postpartum phase.  She notes to be particularly sensitive to medications and will have palpitations and/or chest pressure with a variety of different medication classes, particularly narcotics.  During this hospitalization, episodes of hypotension have been noted in addition to bradycardia.  Today's EKG showed sinus bradycardia with no known AV block or ST wave abnormalities.  She was given an IV bolus which did not seem to have much of an effect on patient's heart rate, but did slightly improve her blood pressure.  BP (!) 92/50 (BP Location: Right Arm)   Pulse (!) 47   Temp 97.9 F (36.6 C) (Oral)   Resp 18   Ht 5\' 6"  (1.676 m)   Wt 82.9 kg   LMP 08/18/2021   SpO2 98%   Breastfeeding Unknown   BMI 29.50 kg/m  General: Alert and oriented x 3, at baseline, not in distress Cardiac: Bradycardic rhythm Pulmonary: CTA, normal respiratory effort Abdomen: C/S incision covered by bandage Lower extremities: No edema or signs of DVT noted  A/P: Hypotension and bradycardia: Patient seems to have longstanding history of bradycardic episodes that may or may not be associated with stressors.  Concern for hypotension being 1 of those stressors.  Patient is otherwise stable.   EKG with sinus bradycardia, but no AV block or other abnormalities.  S/p 1 L IVB.  Will limit medications that can exacerbate hypotension including narcotics, antiemetics, antihistamines and zolpidem.  Increase dose of ibuprofen and Tylenol has been scheduled.  TSH ordered CTM and if becomes hemodynamically unstable, will add atropine/rapid response. Plan to refer patient back to cardiology for postpartum bradycardia workup.  Myrtie Hawk, DO FMOB Fellow, Faculty practice [facility], [facility] 05/22/22  11:38 PM

## 2022-05-22 NOTE — Anesthesia Preprocedure Evaluation (Signed)
Anesthesia Evaluation  Patient identified by MRN, date of birth, ID band Patient awake    Reviewed: Allergy & Precautions, NPO status , Patient's Chart, lab work & pertinent test results  History of Anesthesia Complications (+) Family history of anesthesia reaction  Airway Mallampati: II  TM Distance: >3 FB Neck ROM: Full    Dental no notable dental hx.    Pulmonary neg pulmonary ROS, former smoker   Pulmonary exam normal breath sounds clear to auscultation       Cardiovascular Normal cardiovascular exam+ dysrhythmias Supra Ventricular Tachycardia  Rhythm:Regular Rate:Normal     Neuro/Psych  PSYCHIATRIC DISORDERS Anxiety     IIH, currently no symptoms negative neurological ROS     GI/Hepatic Neg liver ROS,GERD  ,,  Endo/Other  negative endocrine ROS    Renal/GU negative Renal ROS  negative genitourinary   Musculoskeletal negative musculoskeletal ROS (+)    Abdominal   Peds  Hematology negative hematology ROS (+)   Anesthesia Other Findings   Reproductive/Obstetrics (+) Pregnancy                             Anesthesia Physical Anesthesia Plan  ASA: 2  Anesthesia Plan: Spinal   Post-op Pain Management:    Induction:   PONV Risk Score and Plan: Treatment may vary due to age or medical condition  Airway Management Planned: Natural Airway  Additional Equipment:   Intra-op Plan:   Post-operative Plan:   Informed Consent: I have reviewed the patients History and Physical, chart, labs and discussed the procedure including the risks, benefits and alternatives for the proposed anesthesia with the patient or authorized representative who has indicated his/her understanding and acceptance.     Dental advisory given  Plan Discussed with: CRNA  Anesthesia Plan Comments:        Anesthesia Quick Evaluation

## 2022-05-22 NOTE — Transfer of Care (Signed)
Immediate Anesthesia Transfer of Care Note  Patient: Lisa Christian  Procedure(s) Performed: CESAREAN SECTION  Patient Location: PACU  Anesthesia Type:Spinal  Level of Consciousness: awake, alert , oriented, and patient cooperative  Airway & Oxygen Therapy: Patient Spontanous Breathing  Post-op Assessment: Report given to RN and Post -op Vital signs reviewed and stable  Post vital signs: Reviewed and stable  Last Vitals:  Vitals Value Taken Time  BP 103/67 05/22/22 1102  Temp    Pulse 61 05/22/22 1108  Resp 15 05/22/22 1108  SpO2 97 % 05/22/22 1108  Vitals shown include unvalidated device data.  Last Pain:  Vitals:   05/22/22 0813  TempSrc: Oral         Complications: No notable events documented.

## 2022-05-22 NOTE — Anesthesia Procedure Notes (Signed)
Spinal  Patient location during procedure: OR Start time: 05/22/2022 9:41 AM End time: 05/22/2022 9:43 AM Reason for block: surgical anesthesia Staffing Performed: anesthesiologist  Anesthesiologist: Elmer Picker, MD Performed by: Elmer Picker, MD Authorized by: Elmer Picker, MD   Preanesthetic Checklist Completed: patient identified, IV checked, risks and benefits discussed, surgical consent, monitors and equipment checked, pre-op evaluation and timeout performed Spinal Block Patient position: sitting Prep: DuraPrep and site prepped and draped Patient monitoring: cardiac monitor, continuous pulse ox and blood pressure Approach: midline Location: L3-4 Injection technique: single-shot Needle Needle type: Pencan  Needle gauge: 24 G Needle length: 9 cm Assessment Sensory level: T6 Events: CSF return Additional Notes Functioning IV was confirmed and monitors were applied. Sterile prep and drape, including hand hygiene and sterile gloves were used. The patient was positioned and the spine was prepped. The skin was anesthetized with lidocaine.  Free flow of clear CSF was obtained prior to injecting local anesthetic into the CSF.  The spinal needle aspirated freely following injection.  The needle was carefully withdrawn.  The patient tolerated the procedure well.

## 2022-05-22 NOTE — Anesthesia Postprocedure Evaluation (Deleted)
Anesthesia Post Note  Patient: Lisa Christian  Procedure(s) Performed: CESAREAN SECTION     Patient location during evaluation: [facility] Anesthesia Type: Spinal Level of consciousness: awake and alert Pain management: pain level controlled Vital Signs Assessment: post-procedure vital signs reviewed and stable Respiratory status: spontaneous breathing, nonlabored ventilation and respiratory function stable Cardiovascular status: stable Postop Assessment: no headache, no backache and epidural receding Anesthetic complications: no   No notable events documented.  Last Vitals:  Vitals:   05/22/22 0813 05/22/22 0822  BP:  109/70  Pulse:  91  Resp: 18   Temp:  36.9 C  SpO2:  99%    Last Pain:  Vitals:   05/22/22 0813  TempSrc: Oral   Pain Goal:                   Marrion Coy

## 2022-05-22 NOTE — Discharge Summary (Signed)
Postpartum Discharge Summary    Patient Name: Lisa Christian DOB: [birth year] MRN: [id number]  Date of admission: 05/22/2022 Delivery date:05/22/2022  Delivering provider: Shonna Chock [place]  Date of discharge: 05/25/2022  Admitting diagnosis: Status post primary low transverse cesarean section [Z98.891] Intrauterine pregnancy: [redacted]w[redacted]d     Secondary diagnosis:  Principal Problem:   Status post primary low transverse cesarean section Active Problems:   Shoulder dystocia during labor and delivery, delivered   HNP (herniated nucleus pulposus), lumbar   Encounter for supervision of normal pregnancy, antepartum   SVT (supraventricular tachycardia)   Cesarean delivery delivered  Additional problems: Anemia due to acute blood loss    Discharge diagnosis: Term Pregnancy Delivered                                              Post partum procedures: IV venofer, declined blood transfusion Augmentation: N/A Complications: None  Hospital course: Sceduled C/S   32 y.o. yo G2P1001 at [redacted]w[redacted]d was admitted to the hospital 05/22/2022 for scheduled cesarean section with the following indication:Macrosomia and prior shoulder dystocia .Delivery details are as follows:  Membrane Rupture Time/Date: 10:09 AM ,05/22/2022   Delivery Method:C-Section, Low Transverse  Details of operation can be found in separate operative note.  Patient had a postpartum course complicated by hypotension and bradycardia along with hgb of 7. She refused transfusion and received venofer x1. She has a cardiology appointment outpatient.  She is ambulating, tolerating a regular diet, passing flatus, and urinating well. Patient is discharged home in stable condition on  05/25/22        Newborn Data: Birth date:05/22/2022  Birth time:10:10 AM  Gender:Female  Living status:Living  Apgars:9 ,9  Weight:4900 g     Magnesium Sulfate received: No BMZ received: No Rhophylac:N/A MMR:N/A T-DaP: ordered postpartum Flu:  N/A Transfusion:No  Physical exam  Vitals:   05/24/22 0522 05/24/22 1216 05/24/22 2042 05/25/22 0500  BP: (!) 92/52 108/68 109/67 97/63  Pulse: 72 71 60 60  Resp: 18 16 17 18   Temp: 98.2 F (36.8 C) 98 F (36.7 C) 98.4 F (36.9 C) 98 F (36.7 C)  TempSrc: Oral Oral Oral Oral  SpO2: 99% 99%    Weight:      Height:       General: alert, cooperative, and no distress Lochia: appropriate Uterine Fundus: firm Incision: Healing well with no significant drainage, No significant erythema, Dressing is clean, dry, and intact DVT Evaluation: No evidence of DVT seen on physical exam. Negative Homan's sign. No cords or calf tenderness. No significant calf/ankle edema. Labs: Lab Results  Component Value Date   WBC 6.8 05/24/2022   HGB 7.7 (L) 05/24/2022   HCT 24.6 (L) 05/24/2022   MCV 91.4 05/24/2022   PLT 176 05/24/2022      Latest Ref Rng & Units 02/01/2022    6:24 AM  CMP  Glucose 70 - 99 mg/dL 782   BUN 6 - 20 mg/dL <5   Creatinine 9.56 - 1.00 mg/dL 2.13   Sodium 086 - 578 mmol/L 133   Potassium 3.5 - 5.1 mmol/L 3.2   Chloride 98 - 111 mmol/L 104   CO2 22 - 32 mmol/L 19   Calcium 8.9 - 10.3 mg/dL 8.1   Total Protein 6.5 - 8.1 g/dL 5.7   Total Bilirubin 0.3 - 1.2 mg/dL 1.0   Alkaline  Phos 38 - 126 U/L 48   AST 15 - 41 U/L 18   ALT 0 - 44 U/L 11    Edinburgh Score:    05/22/2022    7:21 PM  Edinburgh Postnatal Depression Scale Screening Tool  I have been able to laugh and see the funny side of things. 0  I have looked forward with enjoyment to things. 0  I have blamed myself unnecessarily when things went wrong. 0  I have been anxious or worried for no good reason. 0  I have felt scared or panicky for no good reason. 0  Things have been getting on top of me. 0  I have been so unhappy that I have had difficulty sleeping. 0  I have felt sad or miserable. 0  I have been so unhappy that I have been crying. 0  The thought of harming myself has occurred to me. 0   Edinburgh Postnatal Depression Scale Total 0     After visit meds:  Allergies as of 05/25/2022       Reactions   Latex    Vaginal irritation        Medication List     STOP taking these medications    hydrocortisone 25 MG suppository Commonly known as: ANUSOL-HC   ondansetron 4 MG disintegrating tablet Commonly known as: ZOFRAN-ODT       TAKE these medications    docusate 50 MG/5ML liquid Commonly known as: COLACE Take 10 mLs (100 mg total) by mouth at bedtime.   ibuprofen 100 MG/5ML suspension Commonly known as: ADVIL Take 40 mLs (800 mg total) by mouth every 8 (eight) hours for 14 days.   oxyCODONE 5 MG/5ML solution Commonly known as: ROXICODONE Take 5 mLs (5 mg total) by mouth every 4 (four) hours as needed for up to 7 days for severe pain or breakthrough pain.   PRENATAL ADULT GUMMY/DHA/FA PO Take 2 tablets by mouth daily.  gummies   sennosides 8.8 MG/5ML syrup Commonly known as: SENOKOT Take 10 mLs by mouth at bedtime.         Discharge home in stable condition Infant Feeding: Breast Infant Disposition:home with mother Discharge instruction: per After Visit Summary and Postpartum booklet. Activity: Advance as tolerated. Pelvic rest for 6 weeks.  Diet: routine diet Future Appointments: Future Appointments  Date Time Provider Department Center  05/28/2022  3:10 PM Lazaro Arms, MD [facility]  06/07/2022  3:00 PM Tessa Lerner, DO [facility] None  06/25/2022 11:30 AM Cheral Marker, CNM [facility]   Follow up Visit: Message sent   Please schedule this patient for a In person postpartum visit in 6 weeks with the following provider: Any provider. Additional Postpartum F/U:Incision check 1 week , has cardiology follow up High risk pregnancy complicated by:  SVT, macrosomia, prior SD  Delivery mode:  C-Section, Low Transverse  Anticipated Birth Control:  POPs   05/25/2022 Randa Evens Autry-Lott, DO

## 2022-05-22 NOTE — Anesthesia Postprocedure Evaluation (Signed)
Anesthesia Post Note  Patient: Lisa Christian  Procedure(s) Performed: CESAREAN SECTION     Patient location during evaluation: PACU Anesthesia Type: Spinal Level of consciousness: awake and alert Pain management: pain level controlled Vital Signs Assessment: post-procedure vital signs reviewed and stable Respiratory status: spontaneous breathing, nonlabored ventilation, respiratory function stable and patient connected to nasal cannula oxygen Cardiovascular status: blood pressure returned to baseline and stable Postop Assessment: no apparent nausea or vomiting and spinal receding Anesthetic complications: no  No notable events documented.  Last Vitals:  Vitals:   05/22/22 1215 05/22/22 1313  BP: 93/60 (!) 87/53  Pulse: (!) 53 (!) 49  Resp:  18  Temp: 36.7 C 36.5 C  SpO2: 98% 100%    Last Pain:  Vitals:   05/22/22 1313  TempSrc: Oral  PainSc:    Pain Goal: Patients Stated Pain Goal: 3 (05/22/22 1202)                 Kamarius Buckbee L Addilyne Backs

## 2022-05-22 NOTE — H&P (Signed)
LABOR AND DELIVERY ADMISSION HISTORY AND PHYSICAL NOTE  Lisa Christian is a 32 y.o. female G2P1001 with IUP at [redacted]w[redacted]d by L/9 presenting for scheduled primary cesarean.   She reports positive fetal movement. She denies leakage of fluid or vaginal bleeding.  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   Headache    HNP (herniated nucleus pulposus), lumbar    Panic attacks    Pseudotumor cerebri    Regurgitation 11/24/2013   Vitamin D deficiency     Past Surgical History: Past Surgical History:  Procedure Laterality Date   BIOPSY N/A 01/11/2014   Procedure: BIOPSY;  Surgeon: West Bali, MD;  Location: [facility];  Service: Endoscopy;  Laterality: N/A;  duodenal and gastric   ESOPHAGOGASTRODUODENOSCOPY (EGD) WITH PROPOFOL N/A 01/11/2014   OZH:YQMVHQION/GEXB non-erosive   LUMBAR LAMINECTOMY/DECOMPRESSION MICRODISCECTOMY Left 03/01/2019   Procedure: Microdiscectomy - Lumbar Four-Lumbar Five - left;  Surgeon: Donalee Citrin, MD;  Location: [facility] OR;  Service: Neurosurgery;  Laterality: Left;  Microdiscectomy - Lumbar Four-Lumbar Five - left   WISDOM TOOTH EXTRACTION      Obstetrical History: OB History     Gravida  2   Para  1   Term  1   Preterm      AB      Living  1      SAB      IAB      Ectopic      Multiple  0   Live Births  1           Social History: Social History   Socioeconomic History   Marital status: Married    Spouse name: Not on file   Number of children: 1   Years of education: Not on file   Highest education level: Associate degree: occupational, Scientist, product/process development, or vocational program  Occupational History   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  Tobacco Use   Smoking status: Former    Types: Cigarettes    Passive exposure: Past   Smokeless tobacco: Never   Tobacco comments:    Some day smoker when having a drink once or twice a year for about 5 years  Vaping Use   Vaping Use: Never used  Substance and Sexual Activity   Alcohol use: Not Currently    Comment: very rarely   Drug use: No   Sexual activity: Not Currently    Birth control/protection: None    Comment: last sexual intercourse yesterday 01/31/22  Other Topics Concern   Not on file  Social History Narrative   Lives with child   Social Determinants of Health   Financial Resource Strain: Low Risk  (11/15/2021)   Overall Financial Resource Strain (CARDIA)    Difficulty of Paying Living Expenses: Not hard at all  Food Insecurity: No Food Insecurity (05/22/2022)   Hunger Vital Sign    Worried About Running Out of Food in the Last Year: Never true    Ran Out of Food in the Last Year: Never true  Transportation Needs: No Transportation Needs (05/22/2022)   PRAPARE - Transportation  Lack of Transportation (Medical): No    Lack of Transportation (Non-Medical): No  Physical Activity: Inactive (11/15/2021)   Exercise Vital Sign    Days of Exercise per Week: 1 day    Minutes of Exercise per Session: 0 min  Stress: No Stress Concern Present (11/15/2021)   [employer] of Occupational Health - Occupational Stress Questionnaire    Feeling of Stress : Not at all  Social Connections: Socially Isolated (11/15/2021)   Social Connection and Isolation Panel [NHANES]    Frequency of Communication with Friends and Family: Once a week    Frequency of Social Gatherings with Friends and Family: Once a week    Attends Religious Services: Never    Database administrator or Organizations: No    Attends Engineer, structural: Never    Marital Status: Married    Family History: Family History  Problem Relation  Age of Onset   Anxiety disorder Mother    Gallbladder disease Mother    Pancreatic cancer Maternal Uncle    Pancreatic cancer Maternal Uncle    Breast cancer Maternal Grandmother    Heart murmur Maternal Grandmother    Heart disease Maternal Grandfather    Heart disease Paternal Grandmother    Stroke Paternal Grandfather    Colon cancer Maternal Great-grandmother     Allergies: Allergies  Allergen Reactions   Latex     Vaginal irritation    Medications Prior to Admission  Medication Sig Dispense Refill Last Dose   Prenatal MV & Min w/FA-DHA (PRENATAL ADULT GUMMY/DHA/FA PO) Take 2 tablets by mouth daily.  gummies   05/21/2022   hydrocortisone (ANUSOL-HC) 25 MG suppository Place 1 suppository (25 mg total) rectally 2 (two) times daily. (Patient not taking: Reported on 05/15/2022) 12 suppository 4 Not Taking   ondansetron (ZOFRAN-ODT) 4 MG disintegrating tablet Take 1 tablet (4 mg total) by mouth every 6 (six) hours as needed for nausea. (Patient not taking: Reported on 04/01/2022) 20 tablet 0 Not Taking     Review of Systems   All systems reviewed and negative except as stated in HPI  Blood pressure 109/70, pulse 91, temperature 98.4 F (36.9 C), resp. rate 18, height 5\' 6"  (1.676 m), weight 82.9 kg, last menstrual period 08/18/2021, SpO2 99 %. General appearance: alert, cooperative, and appears stated age Lungs: clear to auscultation bilaterally Heart: regular rate and rhythm Abdomen: soft, non-tender; bowel sounds normal Extremities: No calf swelling or tenderness Presentation: cephalic Fetal monitoring: 140s Uterine activity: quiet     Prenatal labs: ABO, Rh: --/--/B POS (04/29 1304) Antibody: NEG (04/29 1304) Rubella: 1.92 (10/26 1402) RPR: NON REACTIVE (04/29 1258)  HBsAg: Negative (10/26 1402)  HIV: Non Reactive (02/12 0908)  GBS: Negative/-- (04/08 1400)  2 hr Glucola: wnl Genetic screening:  wnl Anatomy US: wnl  Prenatal Transfer Tool  Maternal Diabetes:  No Genetic Screening: Normal Maternal Ultrasounds/Referrals: LGA Fetal Ultrasounds or other Referrals:  None Maternal Substance Abuse:  No Significant Maternal Medications:  None Significant Maternal Lab Results: Group B Strep negative  No results found for this or any previous visit (from the past 24 hour(s)).  Patient Active Problem List   Diagnosis Date Noted   Mild mitral & pulmonic regurgitation 03/04/2022   Gallbladder polyp 03/04/2022   Nausea vomiting and diarrhea 02/01/2022   Encounter for supervision of normal pregnancy, antepartum 11/14/2021   HNP (herniated nucleus pulposus), lumbar 03/01/2019   Shoulder dystocia during labor and delivery, delivered 02/14/2015   Abnormal Pap smear of  cervix 04/25/2014   GERD (gastroesophageal reflux disease) 11/24/2013   SVT (supraventricular tachycardia) 08/09/2013    Assessment: Lisa Christian is a 32 y.o. G2P1001 at [redacted]w[redacted]d here for scheduled primary cesarean for LGA fetus and history of prior shoulder dystocia. Extrapolated EFW of this fetus is 5100 g, no diabetes. We discussed risks to mother and fetus with vaginal delivery and with cesarean. She affirmed her decision to proceed with cesarean delivery.  The risks of cesarean section were discussed with the patient including but were not limited to: bleeding which may require transfusion or reoperation; infection which may require antibiotics; injury to bowel, bladder, ureters or other surrounding organs; injury to the fetus; need for additional procedures including hysterectomy in the event of a life-threatening hemorrhage; placental abnormalities wth subsequent pregnancies, incisional problems, thromboembolic phenomenon and other postoperative/anesthesia complications.  Patient declines tubal sterilization. Patient has been NPO since midnight she will remain NPO for procedure. Anesthesia and OR aware.  Preoperative prophylactic antibiotics and SCDs ordered on call to the OR.  To OR when  ready.  # Circ: yes, inpatient  # Contraception: POPs  # Feeding: breast   Lisa Christian 05/22/2022, 8:59 AM

## 2022-05-23 ENCOUNTER — Encounter (HOSPITAL_COMMUNITY): Payer: Self-pay | Admitting: Obstetrics and Gynecology

## 2022-05-23 LAB — BPAM RBC
Blood Product Expiration Date: 202405302359
Unit Type and Rh: 7300

## 2022-05-23 LAB — CBC
HCT: 24 % — ABNORMAL LOW (ref 36.0–46.0)
Hemoglobin: 7.9 g/dL — ABNORMAL LOW (ref 12.0–15.0)
MCH: 29.2 pg (ref 26.0–34.0)
MCHC: 32.9 g/dL (ref 30.0–36.0)
MCV: 88.6 fL (ref 80.0–100.0)
Platelets: 179 10*3/uL (ref 150–400)
RBC: 2.71 MIL/uL — ABNORMAL LOW (ref 3.87–5.11)
RDW: 13.2 % (ref 11.5–15.5)
WBC: 11.8 10*3/uL — ABNORMAL HIGH (ref 4.0–10.5)
nRBC: 0 % (ref 0.0–0.2)

## 2022-05-23 LAB — TSH: TSH: 3.558 u[IU]/mL (ref 0.350–4.500)

## 2022-05-23 LAB — PREPARE RBC (CROSSMATCH)

## 2022-05-23 MED ORDER — IBUPROFEN 100 MG/5ML PO SUSP
800.0000 mg | Freq: Four times a day (QID) | ORAL | Status: DC
  Administered 2022-05-23 – 2022-05-25 (×7): 800 mg via ORAL
  Filled 2022-05-23 (×7): qty 40

## 2022-05-23 MED ORDER — SODIUM CHLORIDE 0.9% IV SOLUTION: Freq: Once | INTRAVENOUS | Status: DC

## 2022-05-23 MED ORDER — SODIUM CHLORIDE 0.9 % IV SOLN
500.0000 mg | Freq: Once | INTRAVENOUS | Status: AC
  Administered 2022-05-23: 500 mg via INTRAVENOUS
  Filled 2022-05-23: qty 25

## 2022-05-23 MED ORDER — SODIUM CHLORIDE 0.9 % IV SOLN
500.0000 mg | Freq: Once | INTRAVENOUS | Status: DC
  Filled 2022-05-23: qty 25

## 2022-05-23 NOTE — Lactation Note (Addendum)
This note was copied from a baby's chart. Lactation Consultation Note  Patient Name: Lisa Christian Lisa Christian Date: 05/23/2022 Age:32 hours Reason for consult: Initial assessment;Term Experienced BF mom doing STS. Baby sleeping. Mom stated the baby had been BF great. Mom has no questions at this time. Mom does want to pump and bottle feed some when she gets home so her daughter 16 yrs old can feed the baby bottle of BM.  Newborn feeding habits, STS, behavior, I&O reviewed. Answered mom's questions. Mom encouraged to feed baby 8-12 times/24 hours and with feeding cues.  Encouraged mom to call for assistance as needed. Maternal Data Does the patient have breastfeeding experience prior to this delivery?: Yes How long did the patient breastfeed?: 2 1/2 yrs to her now 77 yr old daughter  Feeding    LATCH Score                    Lactation Tools Discussed/Used    Interventions Interventions: Breast feeding basics reviewed;Skin to [employer] brochure  Discharge    Consult Status Consult Status: PRN Date: 05/23/22 Follow-up type: In-patient    Charyl Dancer 05/23/2022, 12:25 AM

## 2022-05-23 NOTE — Progress Notes (Signed)
Pt declining blood administration after reading informed consent for blood transfusion.  Pt declined to sign consent and states she feels uneasy after reading risks. Pt asking about iron infusion that was cancelled earlier this morning.  RN informed Dr. Ladon Applebaum about pt concerns and blood refusal.

## 2022-05-23 NOTE — Progress Notes (Addendum)
[name] was referred for history of anxiety.  * Referral screened out by Clinical Social Worker because none of the following criteria appear to apply:  ~ History of anxiety during this pregnancy, or of post-partum depression following prior delivery.  ~ Diagnosis of anxiety within last 3 years  Per OB notes, [name] did not indicate any signs/symptoms of anxiety during pregnancy.  OR  * [name]'s symptoms currently being treated with medication and/or therapy.   Please contact the Clinical Social Worker if needs arise, by [name] request, or if [name] scores greater than 9/yes to question 10 on Edinburgh Postpartum Depression Screen.   Lore Polka, LCSWA Clinical Social Worker [phone] 

## 2022-05-23 NOTE — Lactation Note (Signed)
This note was copied from a baby's chart. Lactation Consultation Note  Patient Name: Lisa Christian [id number] Date: 05/23/2022 Age:32 hours Reason for consult: Follow-up assessment;Term As LC entered the room, baby latched on the left breast , modified cradle position with depth and swallows. Per mom comfortable.  Baby released and the nurse tech changed the diaper , stool transitioning, baby placed on the right breast and LC assisted with positioning, baby gagging , repositioned in the modified laid back and baby still feeding with swallows.   Maternal Data    Feeding Mother's Current Feeding Choice: Breast Milk  LATCH Score Latch:  (latched width depth)  Audible Swallowing:  (swallows noted)     Comfort (Breast/Nipple):  (per mom comfortable)  Hold (Positioning):  (mom latched the baby)       Interventions  Assisted to latch and education   Discharge Pump: Personal;Manual;DEBP  Consult Status Consult Status: Follow-up Date: 05/24/22 Follow-up type: In-patient    Lisa Christian Christerpher Clos 05/23/2022, 2:26 PM

## 2022-05-23 NOTE — Progress Notes (Addendum)
POSTPARTUM PROGRESS NOTE  POD #1  Subjective:  Lisa Christian is a 32 y.o. [phone] s/p pLTCS at [redacted]w[redacted]d for suspected fetal macrosomia.  She reports she doing well. She was noted to have some hypotension and bradycardia last night. She received a 1 litre fluid bolus, and EKG showed sinus bradycardia, with no no other concerns. She does feel some dizziness with ambulation, but is otherwise asymptomatic. She reports she is doing well. She denies any problems with ambulating, voiding or po intake. Denies nausea or vomiting. She has not passed flatus. Pain is well controlled.  Lochia is minimal.  Objective: Blood pressure 95/60, pulse (!) 52, temperature 97.6 F (36.4 C), temperature source Oral, resp. rate 16, height 5\' 6"  (1.676 m), weight 82.9 kg, last menstrual period 08/18/2021, SpO2 97 %, unknown if currently breastfeeding.  Physical Exam:  General: alert, cooperative and no distress Chest: no respiratory distress Heart:regular rate, distal pulses intact Abdomen: soft, nontender,  Uterine Fundus: firm, appropriately tender DVT Evaluation: No calf swelling or tenderness Extremities: no edema Skin: warm, dry; incision clean/dry/intact w/ pressure dressing in place  Recent Labs    05/20/22 1258 05/23/22 0550  HGB 10.3* 7.9*  HCT 32.2* 24.0*    Assessment/Plan: Lisa Christian is a 32 y.o. G2P2002 s/p pLTCS at [redacted]w[redacted]d for suspected fetal macrosomia.  POD#1 - Doing welll; pain is well controlled. H/H appropriate Opiates had to be dc'd due to bradycardia and hypotension. Doing ok so far with current NSAID+tylenol regimen  Routine postpartum care  OOB, ambulated  Lovenox for VTE prophylaxis Acute blood loss Anemia: with some dizziness.  Given vital sign changes, will go ahead and transfuse 1 unit pRBC.  Contraception: POPs Feeding: breast  Dispo: Plan for discharge in 1-2 days.   LOS: 1 day   Addendum: Patient declined blood transfusion, was concerned about potential  risk, after reading consent form. Would prefer IV iron. Venofer ordered.  Sheppard Evens MD MPH OB Fellow, Faculty Practice [facility], [facility] for [facility] 05/23/2022

## 2022-05-24 ENCOUNTER — Other Ambulatory Visit (HOSPITAL_COMMUNITY): Payer: Self-pay

## 2022-05-24 LAB — BPAM RBC

## 2022-05-24 LAB — TYPE AND SCREEN
ABO/RH(D): B POS
Antibody Screen: NEGATIVE
Unit division: 0

## 2022-05-24 LAB — CBC
HCT: 24.6 % — ABNORMAL LOW (ref 36.0–46.0)
Hemoglobin: 7.7 g/dL — ABNORMAL LOW (ref 12.0–15.0)
MCH: 28.6 pg (ref 26.0–34.0)
MCHC: 31.3 g/dL (ref 30.0–36.0)
MCV: 91.4 fL (ref 80.0–100.0)
Platelets: 176 10*3/uL (ref 150–400)
RBC: 2.69 MIL/uL — ABNORMAL LOW (ref 3.87–5.11)
RDW: 13.2 % (ref 11.5–15.5)
WBC: 6.8 10*3/uL (ref 4.0–10.5)
nRBC: 0 % (ref 0.0–0.2)

## 2022-05-24 MED ORDER — IBUPROFEN 100 MG/5ML PO SUSP
800.0000 mg | Freq: Three times a day (TID) | ORAL | 0 refills | Status: DC
  Filled 2022-05-24: qty 480, 4d supply, fill #0

## 2022-05-24 MED ORDER — IBUPROFEN 100 MG/5ML PO SUSP: 800.0000 mg | Freq: Three times a day (TID) | ORAL | 2 refills | Status: AC

## 2022-05-24 MED ORDER — OXYCODONE HCL 5 MG/5ML PO SOLN: 5.0000 mg | ORAL | 0 refills | Status: AC | PRN

## 2022-05-24 MED ORDER — OXYCODONE HCL 5 MG/5ML PO SOLN
5.0000 mg | ORAL | Status: DC | PRN
  Administered 2022-05-24 – 2022-05-25 (×3): 5 mg via ORAL
  Filled 2022-05-24 (×3): qty 5

## 2022-05-24 MED ORDER — SENNOSIDES 8.8 MG/5ML PO SYRP
10.0000 mL | ORAL_SOLUTION | Freq: Every day | ORAL | 0 refills | Status: DC
  Filled 2022-05-24: qty 300, 30d supply, fill #0

## 2022-05-24 MED ORDER — OXYCODONE HCL 5 MG/5ML PO SOLN
5.0000 mg | ORAL | 0 refills | Status: DC | PRN
  Filled 2022-05-24: qty 473, 16d supply, fill #0
  Filled 2022-05-24: qty 210, 7d supply, fill #0

## 2022-05-24 MED ORDER — OXYCODONE HCL 5 MG PO TABS: 5.0000 mg | ORAL_TABLET | ORAL | Status: DC | PRN

## 2022-05-24 MED ORDER — SENNOSIDES 8.8 MG/5ML PO SYRP
10.0000 mL | ORAL_SOLUTION | Freq: Every day | ORAL | Status: DC
  Administered 2022-05-24: 10 mL via ORAL
  Filled 2022-05-24 (×2): qty 10

## 2022-05-24 MED ORDER — DOCUSATE SODIUM 50 MG/5ML PO LIQD
100.0000 mg | Freq: Every day | ORAL | Status: DC
  Administered 2022-05-24: 100 mg via ORAL
  Filled 2022-05-24 (×2): qty 10

## 2022-05-24 MED ORDER — SENNOSIDES 8.8 MG/5ML PO SYRP: 10.0000 mL | ORAL_SOLUTION | Freq: Every day | ORAL | 2 refills | Status: DC

## 2022-05-24 MED ORDER — DOCUSATE SODIUM 50 MG/5ML PO LIQD
100.0000 mg | Freq: Every day | ORAL | 0 refills | Status: DC
  Filled 2022-05-24: qty 100, 10d supply, fill #0

## 2022-05-24 MED ORDER — DOCUSATE SODIUM 50 MG/5ML PO LIQD: 100.0000 mg | Freq: Every day | ORAL | 2 refills | Status: DC

## 2022-05-24 MED ORDER — DOCUSATE SODIUM 50 MG/5ML PO LIQD
100.0000 mg | Freq: Every day | ORAL | 0 refills | Status: DC
  Filled 2022-05-24: qty 200, 20d supply, fill #0

## 2022-05-24 NOTE — Lactation Note (Signed)
This note was copied from a baby's chart. Lactation Consultation Note  Patient Name: Lisa Christian [id number] Date: 05/24/2022 Age:32 hours Reason for consult: Follow-up assessment;Infant weight loss (weight loss -8.47%).  Birth Parent is experienced with breastfeeding, she BF 1st child for 2 years 6 months. Birth Parent feels infant is breastfeeding well most feedings are 30 minutes in length. Infant has not bee spitty today, had 8 stools and 3 voids since birth. LC did not observe latch, infant recently BF and asleep on Birth Parent's chest, skin to skin. Birth Parent has used hand pump few times, encourage to continue to latch infant at breast by cues, on demand, 8 to 12+ times and can continue to use hand pump after latching infant at breast and give infant back any EBM. LC reviewed importance of maternal rest, diet and hydration. Birth Parent doesn't have any questions or concerns for LC at this time. LC discussed delay  infant's pacifier usage until 3 weeks postpartum.   Maternal Data    Feeding Mother's Current Feeding Choice: Breast Milk  LATCH Score                    Lactation Tools Discussed/Used    Interventions Interventions: Education;Hand pump  Discharge    Consult Status Consult Status: Follow-up Date: 05/25/22 Follow-up type: In-patient    Frederico Hamman 05/24/2022, 10:43 PM

## 2022-05-24 NOTE — Progress Notes (Addendum)
Postpartum Day 2: Cesarean Delivery  Subjective: Patient reports increased incisional pain, narcotics held due to low BP and pulse yesterday. Vitals have stayed stable, and HR improved.  Reports tolerating PO, + flatus, and no problems voiding.  Baby is stable at bedside.  Objective: Patient Vitals for the past 24 hrs:  BP Temp Temp src Pulse Resp SpO2  05/24/22 0522 (!) 92/52 98.2 F (36.8 C) Oral 72 18 99 %  05/23/22 2116 104/62 98.4 F (36.9 C) Oral (!) 55 18 97 %  05/23/22 1359 (!) 93/52 98.3 F (36.8 C) Oral (!) 59 17 99 %    Physical Exam:  General: alert and no distress Lochia: appropriate Uterine Fundus: firm Incision: healing well, dressing C/D/I DVT Evaluation: No evidence of DVT seen on physical exam. Negative Homan's sign. No cords or calf tenderness.No significant calf/ankle edema.  Recent Labs    05/23/22 0550 05/24/22 0530  HGB 7.9* 7.7*  HCT 24.0* 24.6*    Assessment/Plan: Status post Cesarean section. Postoperative course complicated by acute blood loss anemia, low BP and low HR . Patient received Venofer on 5/2, declined transfusion.   Cardiology follow up scheduled 06/07/22. Oxycodone 5 mg restarted as needed for severe pain, recommended to take only as needed if pain not controlled on Acetaminophen and Ibuprofen. Plan to discharge tomorrow if remains stable, meds to beds done today given patient's preference for liquid forms of medications (not easily obtained at outside pharmacies).  Jaynie Collins, MD 05/24/2022, 9:17 AM    Addendum Patient unable to get meds here due to insurance restrictions. Meds sent to [facility] near home. Our pharmacist Argentina Ponder, [facility]) called and verified they do carry the liquid formulations, appreciate her help.   Jaynie Collins, MD

## 2022-05-26 ENCOUNTER — Ambulatory Visit (HOSPITAL_BASED_OUTPATIENT_CLINIC_OR_DEPARTMENT_OTHER)
Admission: RE | Admit: 2022-05-26 | Discharge: 2022-05-26 | Disposition: A | Discharge status: Home / Self Care | Payer: BLUE CROSS/BLUE SHIELD | Source: Ambulatory Visit | Attending: Obstetrics & Gynecology | Admitting: Obstetrics & Gynecology

## 2022-05-26 ENCOUNTER — Inpatient Hospital Stay (HOSPITAL_COMMUNITY)
Admission: AD | Admit: 2022-05-26 | Discharge: 2022-05-26 | Disposition: A | Discharge status: Home / Self Care | Payer: BLUE CROSS/BLUE SHIELD | Attending: Obstetrics & Gynecology | Admitting: Obstetrics & Gynecology

## 2022-05-26 DIAGNOSIS — I808 Phlebitis and thrombophlebitis of other sites: Secondary | ICD-10-CM | POA: Insufficient documentation

## 2022-05-26 DIAGNOSIS — O909 Complication of the puerperium, unspecified: Secondary | ICD-10-CM | POA: Diagnosis present

## 2022-05-26 DIAGNOSIS — M79602 Pain in left arm: Secondary | ICD-10-CM | POA: Insufficient documentation

## 2022-05-26 DIAGNOSIS — Z98891 History of uterine scar from previous surgery: Secondary | ICD-10-CM

## 2022-05-26 DIAGNOSIS — M7989 Other specified soft tissue disorders: Secondary | ICD-10-CM | POA: Diagnosis not present

## 2022-05-26 MED ORDER — ENOXAPARIN SODIUM 80 MG/0.8ML IJ SOSY
80.0000 mg | PREFILLED_SYRINGE | Freq: Once | INTRAMUSCULAR | Status: AC
  Administered 2022-05-26: 80 mg via SUBCUTANEOUS
  Filled 2022-05-26: qty 0.8

## 2022-05-26 NOTE — MAU Note (Signed)
..  Lisa Christian is a 32 y.o. at Unknown here in MAU reporting: left arm swelling, warm, and painful after an IV  Pain score: 7/10 Vitals:   05/26/22 0115  BP: 112/60  Pulse: (!) 59  Resp: 17  Temp: 98.1 F (36.7 C)  SpO2: 99%   CNM at bedside

## 2022-05-26 NOTE — Progress Notes (Signed)
VASCULAR LAB    Left upper extremity venous duplex has been performed.  See CV proc for preliminary results.   Milani Lowenstein, RVT 05/26/2022, 8:41 AM;

## 2022-05-26 NOTE — MAU Provider Note (Signed)
History     CSN: [id number]  Arrival date and time: 05/26/22 4098   Event Date/Time   First Provider Initiated Contact with Patient 05/26/22 0115      Chief Complaint  Patient presents with   Arm Pain   HPI Lisa Christian is a 32 y.o. [id number] patient. She is s/p primary cesarean on 05/22/2022. She presents to MAU with chief complaint of redness, swelling and pain at the site of her IV catheter in her left arm. Patient states she reported pain at her IV site the day before she was discharged home. She noted new onset tenderness, redness and swelling earlier today. She states the pain feels as if it is radiating up her arm and terminates at her shoulder. She denies SOB, activity intolerance, weakness or syncope.  Patient is scheduled for an incision check at [facility] on 05/28/2022. She denies concerns related to her incision. She denies concerns related to lactation.  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   Headache    HNP (herniated nucleus pulposus), lumbar    Panic attacks    Pseudotumor cerebri    Regurgitation 11/24/2013   Vitamin D deficiency     Past Surgical History:  Procedure Laterality Date   BIOPSY N/A 01/11/2014   Procedure: BIOPSY;  Surgeon: West Bali, MD;  Location: [facility];  Service: Endoscopy;  Laterality: N/A;  duodenal and gastric   CESAREAN SECTION N/A 05/22/2022   Procedure: CESAREAN SECTION;  Surgeon: Kathrynn Running, MD;  Location: [facility];  Service: Obstetrics;  Laterality: N/A;    ESOPHAGOGASTRODUODENOSCOPY (EGD) WITH PROPOFOL N/A 01/11/2014   GNF:AOZHYQMVH/QION non-erosive   LUMBAR LAMINECTOMY/DECOMPRESSION MICRODISCECTOMY Left 03/01/2019   Procedure: Microdiscectomy - Lumbar Four-Lumbar Five - left;  Surgeon: Donalee Citrin, MD;  Location: [facility] OR;  Service: Neurosurgery;  Laterality: Left;  Microdiscectomy - Lumbar Four-Lumbar Five - left   WISDOM TOOTH EXTRACTION      Family History  Problem Relation Age of Onset   Anxiety disorder Mother    Gallbladder disease Mother    Pancreatic cancer Maternal Uncle    Pancreatic cancer Maternal Uncle    Breast cancer Maternal Grandmother    Heart murmur Maternal Grandmother    Heart disease Maternal Grandfather    Heart disease Paternal Grandmother    Stroke Paternal Grandfather    Colon cancer Maternal Great-grandmother     Social History   Tobacco Use   Smoking status: Former    Types: Cigarettes    Passive exposure: Past   Smokeless tobacco: Never   Tobacco comments:    Some day smoker when having a drink once or twice a year for about 5 years  Vaping Use  Vaping Use: Never used  Substance Use Topics   Alcohol use: Not Currently    Comment: very rarely   Drug use: No    Allergies:  Allergies  Allergen Reactions   Latex     Vaginal irritation    Medications Prior to Admission  Medication Sig Dispense Refill Last Dose   docusate (COLACE) 50 MG/5ML liquid Take 10 mLs (100 mg total) by mouth at bedtime. 300 mL 2    ibuprofen (ADVIL) 100 MG/5ML suspension Take 40 mLs (800 mg total) by mouth every 8 (eight) hours for 14 days. 1700 mL 2    oxyCODONE (ROXICODONE) 5 MG/5ML solution Take 5 mLs (5 mg total) by mouth every 4 (four) hours as needed for up to 7 days for severe pain or breakthrough pain. 210 mL 0    Prenatal MV & Min w/FA-DHA (PRENATAL ADULT GUMMY/DHA/FA PO) Take 2 tablets by mouth daily.  gummies      sennosides (SENOKOT) 8.8 MG/5ML syrup Take 10 mLs by mouth at bedtime. 300 mL 2     Review of  Systems  Skin:  Positive for color change.       Pale red rash and areas of swelling inferior and superior to former IV site on left forearm  All other systems reviewed and are negative.  Physical Exam   Blood pressure 112/60, pulse (!) 59, temperature 98.1 F (36.7 C), temperature source Oral, resp. rate 17, height 5\' 6"  (1.676 m), weight 76.4 kg, SpO2 99 %, unknown if currently breastfeeding.  Physical Exam Vitals and nursing note reviewed. Exam conducted with a chaperone present.  Constitutional:      Appearance: Normal appearance. She is not ill-appearing.  Cardiovascular:     Rate and Rhythm: Normal rate.     Pulses: Normal pulses.  Pulmonary:     Effort: Pulmonary effort is normal.  Skin:    Capillary Refill: Capillary refill takes less than 2 seconds.     Comments: Incision check declined by patient  Neurological:     Mental Status: She is alert and oriented to person, place, and time.  Psychiatric:        Mood and Affect: Mood normal.        Behavior: Behavior normal.        Thought Content: Thought content normal.        Judgment: Judgment normal.      Media Information    MAU Course  Procedures  MDM Orders Placed This Encounter  Procedures   Discharge patient   VAS [place] UPPER EXTREMITY VENOUS DUPLEX   Meds ordered this encounter  Medications   enoxaparin (LOVENOX) injection 80 mg    Thrombophlebitis left upper extremity at IV site. Coverage while patient waits for venous doppler in the morning please   Assessment and Plan  --32 y.o. [id number] s/p primary cesarean 05/22/2022 --Superficial thrombophlebitis left forearm --Patient agreeable to prophylactic treatment with Lovenox SQ --Continue NSAIDs for management of discomfort --Order placed for walk-in outpatient Doppler US per MAU policy later this morning --Incision assessment declined by patient --Discharge home in stable condition  F/U: --Patient has incision check at [facility]  05/28/2022  Calvert Cantor, MSA, MSN, CNM 05/26/2022, 4:31 AM

## 2022-05-28 ENCOUNTER — Encounter: Payer: Self-pay | Admitting: Obstetrics & Gynecology

## 2022-05-28 ENCOUNTER — Ambulatory Visit (INDEPENDENT_AMBULATORY_CARE_PROVIDER_SITE_OTHER): Payer: BLUE CROSS/BLUE SHIELD | Admitting: Obstetrics & Gynecology

## 2022-05-28 VITALS — BP 106/71 | HR 74 | Ht 65.0 in | Wt 166.0 lb

## 2022-05-28 DIAGNOSIS — T801XXA Vascular complications following infusion, transfusion and therapeutic injection, initial encounter: Secondary | ICD-10-CM

## 2022-05-28 DIAGNOSIS — I809 Phlebitis and thrombophlebitis of unspecified site: Secondary | ICD-10-CM

## 2022-05-28 DIAGNOSIS — Z9889 Other specified postprocedural states: Secondary | ICD-10-CM

## 2022-05-28 NOTE — Progress Notes (Signed)
  HPI: Patient returns for routine postoperative follow-up having undergone primary Caesarean section  on 05/22/22.  The patient's immediate postoperative recovery has been unremarkable. Since hospital discharge the patient reports no problems.   Current Outpatient Medications: docusate (COLACE) 50 MG/5ML liquid, Take 10 mLs (100 mg total) by mouth at bedtime., Disp: 300 mL, Rfl: 2 ibuprofen (ADVIL) 100 MG/5ML suspension, Take 40 mLs (800 mg total) by mouth every 8 (eight) hours for 14 days., Disp: 1700 mL, Rfl: 2 Prenatal MV & Min w/FA-DHA (PRENATAL ADULT GUMMY/DHA/FA PO), Take 2 tablets by mouth daily.  gummies, Disp: , Rfl:  sennosides (SENOKOT) 8.8 MG/5ML syrup, Take 10 mLs by mouth at bedtime., Disp: 300 mL, Rfl: 2 oxyCODONE (ROXICODONE) 5 MG/5ML solution, Take 5 mLs (5 mg total) by mouth every 4 (four) hours as needed for up to 7 days for severe pain or breakthrough pain. (Patient not taking: Reported on 05/28/2022), Disp: 210 mL, Rfl: 0  No current facility-administered medications for this visit.    Blood pressure 106/71, pulse 74, height 5\' 5"  (1.651 m), weight 166 lb (75.3 kg), currently breastfeeding.  Physical Exam: Incision clean dry intact An=bdomen is soft benign  Diagnostic Tests:   Pathology:   Impression + Management plan:   ICD-10-CM   1. Post-operative state: primary Caesarean section due to fetal macrosomia  Z98.890     2. Phlebitis after infusion, initial encounter  T80.1XXA    I80.9    2 aspirin enteric coated per day for 14 days + warm soaks         Medications Prescribed this encounter: No orders of the defined types were placed in this encounter.     Follow up: 5 weeks pp-->considering micronor since she is breast feeding   Lazaro Arms, MD Attending Physician for the Center for [facility] and [facility], [facility] 05/28/2022 3:45 PM

## 2022-05-31 ENCOUNTER — Encounter: Payer: Self-pay | Admitting: Women's Health

## 2022-06-07 ENCOUNTER — Ambulatory Visit: Payer: BLUE CROSS/BLUE SHIELD | Admitting: Cardiology

## 2022-06-07 ENCOUNTER — Encounter: Payer: Self-pay | Admitting: Cardiology

## 2022-06-07 VITALS — BP 108/73 | HR 70 | Resp 16 | Ht 65.0 in | Wt 159.2 lb

## 2022-06-07 DIAGNOSIS — R001 Bradycardia, unspecified: Secondary | ICD-10-CM

## 2022-06-07 DIAGNOSIS — R002 Palpitations: Secondary | ICD-10-CM

## 2022-06-07 DIAGNOSIS — O9081 Anemia of the puerperium: Secondary | ICD-10-CM

## 2022-06-07 NOTE — Progress Notes (Signed)
Date:  06/07/2022   ID:  Lisa Christian, DOB 05-Mar-1990, MRN [id number]  PCP:  [facility], [facility]  Cardiologist:  Tessa Lerner, DO, [facility] (established care 04/17/2021)  Date: 06/07/22 Last Office Visit: 05/29/2021  Chief Complaint  Patient presents with   Bradycardia   Follow-up    HPI  Lisa Christian is a 32 y.o. Caucasian female who does not have any significant past cardiac history.  She was referred to the practice for evaluation of palpitations and review of systems also positive for precordial discomfort.  Patient was referred to the practice for evaluation of palpitations/chest pain/shortness of breath.  Given her palpitations she did undergo cardiac monitor in for noncardiac discomfort/shortness of breath echo and GXT were performed.  Results noted below for further reference.  She delivered a baby boy on May 22, 2022.  Postpartum she was noted to be hypotensive and bradycardic with a hemoglobin of 7 g/dL.  She had refused blood transfusion but did get venofer.  She is now referred to cardiology for further evaluation.  She was discharged from the hospital approximately 2 weeks ago.  Overall doing well.  Denies anginal chest pain or heart failure symptoms.  No near-syncope or syncopal events.  FUNCTIONAL STATUS: No structured exercise program or daily routine.   ALLERGIES: Allergies  Allergen Reactions   Latex     Vaginal irritation    MEDICATION LIST PRIOR TO VISIT: Current Meds  Medication Sig   ibuprofen (ADVIL) 100 MG/5ML suspension Take 40 mLs (800 mg total) by mouth every 8 (eight) hours for 14 days.   Prenatal MV & Min w/FA-DHA (PRENATAL ADULT GUMMY/DHA/FA PO) Take 2 tablets by mouth daily.  gummies   sennosides (SENOKOT) 8.8 MG/5ML syrup Take 10 mLs by mouth at bedtime.     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   Headache    HNP (herniated nucleus pulposus), lumbar    Panic attacks    Pseudotumor cerebri    Regurgitation 11/24/2013   Vitamin D deficiency     PAST SURGICAL HISTORY: Past Surgical History:  Procedure Laterality Date   BIOPSY N/A 01/11/2014   Procedure: BIOPSY;  Surgeon: West Bali, MD;  Location: [facility]S;  Service: Endoscopy;  Laterality: N/A;  duodenal and gastric   CESAREAN SECTION N/A 05/22/2022   Procedure: CESAREAN SECTION;  Surgeon: Kathrynn Running, MD;  Location: [facility] LD ORS;  Service: Obstetrics;  Laterality: N/A;   ESOPHAGOGASTRODUODENOSCOPY (EGD) WITH PROPOFOL N/A 01/11/2014   WUJ:WJXBJYNWG/NFAO non-erosive   LUMBAR LAMINECTOMY/DECOMPRESSION MICRODISCECTOMY Left 03/01/2019   Procedure: Microdiscectomy - Lumbar Four-Lumbar Five - left;  Surgeon: Donalee Citrin, MD;  Location: [facility];  Service: Neurosurgery;  Laterality: Left;  Microdiscectomy - Lumbar Four-Lumbar Five - left   WISDOM TOOTH EXTRACTION      FAMILY HISTORY: The patient family history includes Anxiety disorder in her mother; Breast cancer in her maternal grandmother; Colon cancer in her maternal great-grandmother; Gallbladder disease in her mother; Heart disease in her maternal grandfather and paternal grandmother; Heart murmur in her maternal grandmother; Pancreatic cancer in her maternal uncle and maternal uncle; Stroke in her paternal grandfather.  SOCIAL HISTORY:  The patient  reports that she has never smoked. She has been exposed to tobacco smoke. She has never used smokeless tobacco. She reports that she does not currently use alcohol. She reports that she does not use drugs.  REVIEW OF SYSTEMS: Review of Systems  Cardiovascular:  Negative for chest pain, claudication, dyspnea on exertion, leg swelling, near-syncope, orthopnea,  palpitations, paroxysmal nocturnal dyspnea and syncope.  Respiratory:  Negative for shortness of breath.     PHYSICAL EXAM:    06/07/2022    2:57 PM 05/28/2022    3:22 PM 05/26/2022    2:18 AM  Vitals with BMI  Height 5\' 5"  5\' 5"    Weight 159 lbs 3 oz 166 lbs   BMI 26.49 27.62   Systolic 108 106 409  Diastolic 73 71 63  Pulse 70 74    Orthostatic VS for the past 72 hrs (Last 3 readings):  Orthostatic BP Patient Position BP Location Cuff Size Orthostatic Pulse  06/07/22 1533 107/71 Standing Left Arm Normal 69  06/07/22 1532 98/70 Sitting Left Arm Normal 55  06/07/22 1528 92/60 Supine Left Arm Normal 50     Physical Exam  Constitutional: No distress.  Age appropriate, hemodynamically stable.   Neck: No JVD present.  Cardiovascular: Normal rate, regular rhythm, S1 normal, S2 normal, intact distal pulses and normal pulses. Exam reveals no gallop, no S3 and no S4.  No murmur heard. Pulses:      Dorsalis pedis pulses are 2+ on the right side and 2+ on the left side.       Posterior tibial pulses are 2+ on the right side and 2+ on the left side.  Pulmonary/Chest: Effort normal and breath sounds normal. No stridor. She has no wheezes. She has no rales.  Abdominal: Soft. Bowel sounds are normal. She exhibits no distension. There is no abdominal tenderness.  Musculoskeletal:        General: No edema.     Cervical back: Neck supple.  Neurological: She is alert and oriented to person, place, and time. She has intact cranial nerves (2-12).  Skin: Skin is warm and moist.    CARDIAC DATABASE: EKG: 04/09/2021: Normal sinus rhythm, 62 bpm, without underlying ischemia or injury pattern.  May 28, 2022: Sinus bradycardia, 54 bpm, occasional PACs, without underlying ischemia or injury pattern  Cardiac monitor (Zio Patch): Patch Wear Time:  4 days and 21 hours  Dominant rhythm normal sinus rhythm. Heart rate 40-144 bpm.  Avg HR 71 bpm. No atrial fibrillation, supraventricular tachycardia,  ventricular tachycardia, high grade AV block, pauses (3 seconds or longer). Total ventricular ectopic burden <1%. Total supraventricular ectopic burden <1%. Patient triggered events: 22.  Underlying rhythm sinus without dysrhythmias.  These did not correlate with any significant dysrhythmias.  Echocardiogram: 04/26/2021:  Normal LV systolic function with visual EF 60-65%. Left ventricle cavity is normal in size. Normal left ventricular wall thickness. Normal global wall motion. Normal diastolic filling pattern, normal LAP. Calculated EF 60%.  Mild (Grade I) mitral regurgitation.  Mild pulmonic regurgitation.  No prior study for comparison.   Stress Testing: Exercise treadmill stress test 05/18/2021: Exercise treadmill stress test performed using Bruce protocol.  Patient reached 10.1 METS, and 90% of age predicted maximum heart rate.  Exercise capacity was excellent.  No chest pain reported.  Normal heart rate and hemodynamic response. Stress EKG revealed no ischemic changes. Low risk study.  Heart Catheterization: None  LABORATORY DATA:    Latest Ref Rng & Units  05/24/2022    5:30 AM 05/23/2022    5:50 AM 05/20/2022   12:58 PM  CBC  WBC 4.0 - 10.5 K/uL 6.8  11.8  10.4   Hemoglobin 12.0 - 15.0 g/dL 7.7  7.9  40.9   Hematocrit 36.0 - 46.0 % 24.6  24.0  32.2   Platelets 150 - 400 K/uL 176  179  213        Latest Ref Rng & Units 02/01/2022    6:24 AM 06/07/2021   12:02 PM 05/02/2020    8:44 PM  CMP  Glucose 70 - 99 mg/dL 811   914   BUN 6 - 20 mg/dL <5   5   Creatinine 7.82 - 1.00 mg/dL 9.56   2.13   Sodium 086 - 145 mmol/L 133   136   Potassium 3.5 - 5.1 mmol/L 3.2   3.8   Chloride 98 - 111 mmol/L 104   106   CO2 22 - 32 mmol/L 19   26   Calcium 8.9 - 10.3 mg/dL 8.1   8.9   Total Protein 6.5 - 8.1 g/dL 5.7  7.3    Total Bilirubin 0.3 - 1.2 mg/dL 1.0  0.5    Alkaline Phos 38 - 126 U/L 48  43    AST 15 - 41 U/L 18  13    ALT 0 - 44 U/L 11  13      Lipid Panel     Component  Value Date/Time   CHOL 150 06/07/2021 1202   TRIG 101.0 06/07/2021 1202   HDL 37.20 (L) 06/07/2021 1202   CHOLHDL 4 06/07/2021 1202   VLDL 20.2 06/07/2021 1202   LDLCALC 92 06/07/2021 1202    No components found for: "NTPROBNP" No results for input(s): "PROBNP" in the last 8760 hours. Recent Labs    05/23/22 0550  TSH 3.558    BMP Recent Labs    02/01/22 0624  NA 133*  K 3.2*  CL 104  CO2 19*  GLUCOSE 105*  BUN <5*  CREATININE 0.55  CALCIUM 8.1*  GFRNONAA >60     HEMOGLOBIN A1C Lab Results  Component Value Date   HGBA1C 4.9 11/15/2021   External Labs: Collected: 04/11/2021 provided by primary team. TSH 2.33. Hemoglobin 12.8 g/dL, hematocrit 57.8%. BUN 10, creatinine 0.76. Sodium 140, potassium 4.1, chloride 104, bicarb 25. AST 11, ALT 9, alkaline phosphatase 38  IMPRESSION:    ICD-10-CM   1. Palpitations  R00.2 EKG 12-Lead    2. Postpartum anemia  O90.81     3. Bradycardia  R00.1 PCV ECHOCARDIOGRAM COMPLETE    4. Postpartum state  Z39.2        RECOMMENDATIONS: Amauri Helbling is a 32 y.o. Caucasian female without any significant past medical history.  Patient reported abdominal distention 20 2022 evaluation of palpitations, chest pain, shortness of breath.  At that time she did undergo cardiac monitor, GXT, echocardiogram.  In May 2024 she gave birth on a baby boy; however, post C-section she was noted to be bradycardic and hypotensive.  She also had postoperative anemia likely secondary to blood loss no refused blood transfusion.  She had lost approximately 3 g of blood.  She did get IV iron prior to discharge.  Clinically she denies anginal chest pain, heart failure symptoms, near-syncope or syncopal events.  At baseline she did have a soft blood pressure and on the cardiac monitoring her average heart rate was 71 bpm.  EKG shows sinus bradycardia with occasional  PAC.  Orthostatic vital signs negative.  Advised to continue self  well-hydrated, increase salt to the diet, encouraged consumption of foods high in iron, and follow-up upon hemoglobin/hematocrit and TSH with either PCP or OB/GYN.  Patient is euvolemic, heart failure symptoms, no exertional symptoms, therefore pretest probability for postpartum cardiomyopathy is well.  However for completeness we will proceed with echocardiography for further evaluation.   For now would like to see her back in 3 months or sooner if changes clinical status based on the results of diagnostic testing.  FINAL MEDICATION LIST END OF ENCOUNTER: No orders of the defined types were placed in this encounter.   Medications Discontinued During This Encounter  Medication Reason   docusate (COLACE) 50 MG/5ML liquid      Current Outpatient Medications:    ibuprofen (ADVIL) 100 MG/5ML suspension, Take 40 mLs (800 mg total) by mouth every 8 (eight) hours for 14 days., Disp: 1700 mL, Rfl: 2   Prenatal MV & Min w/FA-DHA (PRENATAL ADULT GUMMY/DHA/FA PO), Take 2 tablets by mouth daily.  gummies, Disp: , Rfl:    sennosides (SENOKOT) 8.8 MG/5ML syrup, Take 10 mLs by mouth at bedtime., Disp: 300 mL, Rfl: 2  Orders Placed This Encounter  Procedures   EKG 12-Lead   PCV ECHOCARDIOGRAM COMPLETE    There are no Patient Instructions on file for this visit.   --Continue cardiac medications as reconciled in final medication list. --Return in about 3 months (around 09/07/2022) for Follow up hypotension/bradycardia (post-partum). Or sooner if needed. --Continue follow-up with your primary care physician regarding the management of your other chronic comorbid conditions.  Patient's questions and concerns were addressed to her satisfaction. She voices understanding of the instructions provided during this encounter.   This note was created using a voice recognition software as a result there may be grammatical errors inadvertently enclosed that do not reflect the nature of this encounter. Every  attempt is made to correct such errors.  Tessa Lerner, [place], [facility]  Pager:  [phone] Office: [phone]

## 2022-06-25 ENCOUNTER — Ambulatory Visit (INDEPENDENT_AMBULATORY_CARE_PROVIDER_SITE_OTHER): Payer: BLUE CROSS/BLUE SHIELD | Admitting: Obstetrics and Gynecology

## 2022-06-25 ENCOUNTER — Encounter: Payer: Self-pay | Admitting: Obstetrics and Gynecology

## 2022-06-25 ENCOUNTER — Other Ambulatory Visit (HOSPITAL_COMMUNITY)
Admission: RE | Admit: 2022-06-25 | Discharge: 2022-06-25 | Disposition: A | Discharge status: Home / Self Care | Payer: BLUE CROSS/BLUE SHIELD | Source: Ambulatory Visit | Attending: Women's Health | Admitting: Women's Health

## 2022-06-25 DIAGNOSIS — N898 Other specified noninflammatory disorders of vagina: Secondary | ICD-10-CM | POA: Diagnosis present

## 2022-06-25 DIAGNOSIS — Z124 Encounter for screening for malignant neoplasm of cervix: Secondary | ICD-10-CM

## 2022-06-25 DIAGNOSIS — Z30011 Encounter for initial prescription of contraceptive pills: Secondary | ICD-10-CM

## 2022-06-25 DIAGNOSIS — K649 Unspecified hemorrhoids: Secondary | ICD-10-CM | POA: Diagnosis not present

## 2022-06-25 MED ORDER — PHENYLEPHRINE IN HARD FAT 0.25 % RE SUPP
1.0000 | Freq: Two times a day (BID) | RECTAL | 3 refills | Status: DC
Start: 2022-06-25 — End: 2022-09-13

## 2022-06-25 MED ORDER — NORETHINDRONE 0.35 MG PO TABS
1.0000 | ORAL_TABLET | Freq: Every day | ORAL | 11 refills | Status: DC
Start: 2022-06-25 — End: 2022-09-13

## 2022-06-25 NOTE — Progress Notes (Signed)
Post Partum Visit Note  Lisa Christian is a 32 y.o. G63P2002 female who presents for a postpartum visit. She is 4 weeks postpartum following a primary cesarean section.  I have fully reviewed the prenatal and intrapartum course. The delivery was at 39 gestational weeks.  Anesthesia: spinal. Postpartum course has been good. Baby is doing well. Baby is feeding by both breast and bottle - Similac . Bleeding no bleeding. Bowel function is abnormal: still having problems with hemorrhoids . Bladder function is normal. Patient is not sexually active. Contraception method is oral progesterone-only contraceptive. Postpartum depression screening: negative.   The pregnancy intention screening data noted above was reviewed. Potential methods of contraception were discussed. The patient elected to proceed with No data recorded.   Edinburgh Postnatal Depression Scale - 06/25/22 1149       Edinburgh Postnatal Depression Scale:  In the Past 7 Days   I have been able to laugh and see the funny side of things. 0    I have looked forward with enjoyment to things. 0    I have blamed myself unnecessarily when things went wrong. 0    I have been anxious or worried for no good reason. 0    I have felt scared or panicky for no good reason. 0    Things have been getting on top of me. 0    I have been so unhappy that I have had difficulty sleeping. 0    I have felt sad or miserable. 0    I have been so unhappy that I have been crying. 0    The thought of harming myself has occurred to me. 0    Edinburgh Postnatal Depression Scale Total 0             Health Maintenance Due  Topic Date Due   COVID-19 Vaccine (1) Never done    The following portions of the patient's history were reviewed and updated as appropriate: allergies, current medications, past family history, past medical history, past social history, past surgical history, and problem list.  Review of Systems Pertinent items are noted in  HPI.  Objective:  BP 103/62 (BP Location: Right Arm, Patient Position: Sitting, Cuff Size: Normal)   Pulse 75   Wt 158 lb 9.6 oz (71.9 kg)   LMP 08/18/2021   Breastfeeding Yes   BMI 26.39 kg/m    General:  alert and cooperative   Breasts:  not indicated  Lungs: Normal effort  Heart:  Normal rate and rhythm  Abdomen: Soft, nontender  Wound well approximated incision  GU exam:  VULVA: normal appearing vulva with no masses, tenderness or lesions  VAGINA: normal appearing vagina with normal color and discharge, no lesions  CERVIX: normal appearing cervix without discharge or lesions, no CMT  Thin prep pap is done with HR HPV cotesting  Chaperone present for exam       Assessment:   1. Postpartum exam   2. Routine cervical smear Hx ASCUS w/ HR HPV 2023 - Cytology - PAP( [facility])  3. Encounter for initial prescription of contraceptive pills Rx sent for POPs, not sexually active prior to initiation  - norethindrone (MICRONOR) 0.35 MG tablet; Take 1 tablet (0.35 mg total) by mouth daily.  Dispense: 28 tablet; Refill: 11  4. Hemorrhoids, unspecified hemorrhoid type Has tried suppositories and creams, reports external discomfort and bleeding. Would like refill suppositories.   Discussed referral to GI for further work up, already seeing GI in  [place] for gallbladder polyp soon, discussed to follow up with them  - phenylephrine (,USE FOR PREPARATION-H,) 0.25 % suppository; Place 1 suppository rectally 2 (two) times daily.  Dispense: 12 suppository; Refill: 3  5. Vaginal discharge D/c getting better, still having odor. Collected swab today  - Cervicovaginal ancillary only  6. SVT/palpitations Following with cardiology  Plan:   Essential components of care per ACOG recommendations:  1.  Mood and well being: Patient with negative depression screening today. Reviewed local resources for support.  - Patient tobacco use? No.   - hx of drug use? No.    2. Infant care and  feeding:  -Patient currently breastmilk feeding? Yes. Discussed returning to work and pumping. Reviewed importance of draining breast regularly to support lactation.  -Social determinants of health (SDOH) reviewed in EPIC. No concerns  3. Sexuality, contraception and birth spacing - Patient does not want a pregnancy in the next year.  Desired family size is unsure children.  - Reviewed reproductive life planning. Reviewed contraceptive methods based on pt preferences and effectiveness.  Patient desired Oral Contraceptive today.   - Discussed birth spacing of 18 months  4. Sleep and fatigue -Encouraged family/partner/community support of 4 hrs of uninterrupted sleep to help with mood and fatigue  5. Physical Recovery  - Discussed patients delivery and complications. She describes her labor as good. - Patient had a C-section.  Perineal healing reviewed. Patient expressed understanding - Patient has urinary incontinence? No. - Patient is safe to resume physical and sexual activity  6.  Health Maintenance - HM due items addressed Yes - Last pap smear  Diagnosis  Date Value Ref Range Status  05/04/2021 (A)  Final   - Atypical squamous cells of undetermined significance (ASC-US)   Pap smear done at today's visit.  -Breast Cancer screening indicated? No.   7. Chronic Disease/Pregnancy Condition follow up: None  - PCP follow up Future Appointments  Date Time Provider Department Center  07/10/2022  1:00 PM PCV-ECHO/VAS 1 PCV-IMG None  09/06/2022  3:00 PM Tolia, Sunit, DO PCV-PCV None    Albertine Grates, FNP Center for [employer], [facility]

## 2022-06-26 LAB — CERVICOVAGINAL ANCILLARY ONLY
Bacterial Vaginitis (gardnerella): POSITIVE — AB
Candida Glabrata: NEGATIVE
Candida Vaginitis: NEGATIVE
Comment: NEGATIVE
Comment: NEGATIVE
Comment: NEGATIVE

## 2022-06-26 LAB — HEMOGLOBIN AND HEMATOCRIT, BLOOD
Hematocrit: 40.1 % (ref 34.0–46.6)
Hemoglobin: 13.1 g/dL (ref 11.1–15.9)

## 2022-06-26 LAB — TSH: TSH: 1.55 u[IU]/mL (ref 0.450–4.500)

## 2022-06-26 MED ORDER — METRONIDAZOLE 500 MG PO TABS
500.0000 mg | ORAL_TABLET | Freq: Two times a day (BID) | ORAL | 0 refills | Status: DC
Start: 2022-06-26 — End: 2022-07-03

## 2022-06-26 NOTE — Addendum Note (Signed)
Addended by: Sue Lush on: 06/26/2022 04:08 PM   Modules accepted: Orders

## 2022-06-28 LAB — CYTOLOGY - PAP
Comment: NEGATIVE
Diagnosis: NEGATIVE
Diagnosis: REACTIVE
High risk HPV: NEGATIVE

## 2022-07-03 ENCOUNTER — Other Ambulatory Visit: Payer: Self-pay | Admitting: Adult Health

## 2022-07-03 MED ORDER — METRONIDAZOLE 0.75 % VA GEL: 1.0000 | Freq: Every day | VAGINAL | 0 refills | Status: DC

## 2022-07-03 NOTE — Progress Notes (Signed)
Will rx metrogel, can not swallow flagyl

## 2022-07-05 MED ORDER — MICONAZOLE NITRATE 2 % EX CREA: 1.0000 | TOPICAL_CREAM | Freq: Two times a day (BID) | CUTANEOUS | 2 refills | Status: AC

## 2022-07-10 ENCOUNTER — Ambulatory Visit: Payer: BLUE CROSS/BLUE SHIELD

## 2022-07-10 DIAGNOSIS — R001 Bradycardia, unspecified: Secondary | ICD-10-CM

## 2022-08-17 ENCOUNTER — Ambulatory Visit (HOSPITAL_BASED_OUTPATIENT_CLINIC_OR_DEPARTMENT_OTHER)
Admission: RE | Admit: 2022-08-17 | Discharge: 2022-08-17 | Disposition: A | Discharge status: Home / Self Care | Payer: BLUE CROSS/BLUE SHIELD | Source: Ambulatory Visit | Attending: Nurse Practitioner | Admitting: Nurse Practitioner

## 2022-08-17 DIAGNOSIS — K824 Cholesterolosis of gallbladder: Secondary | ICD-10-CM | POA: Diagnosis present

## 2022-09-06 ENCOUNTER — Ambulatory Visit: Payer: BLUE CROSS/BLUE SHIELD | Admitting: Cardiology

## 2022-09-13 ENCOUNTER — Ambulatory Visit: Payer: BLUE CROSS/BLUE SHIELD | Admitting: Cardiology

## 2022-09-13 ENCOUNTER — Encounter: Payer: Self-pay | Admitting: Cardiology

## 2022-09-13 VITALS — BP 105/67 | HR 61 | Resp 16 | Ht 65.0 in | Wt 173.0 lb

## 2022-09-13 DIAGNOSIS — R001 Bradycardia, unspecified: Secondary | ICD-10-CM

## 2022-09-13 DIAGNOSIS — R002 Palpitations: Secondary | ICD-10-CM

## 2022-09-13 NOTE — Progress Notes (Signed)
Date:  09/13/2022   ID:  Lisa Christian, DOB 13-Oct-1990, MRN [id number]  PCP:  [facility], [facility]  Cardiologist:  Tessa Lerner, DO, [facility] (established care 04/17/2021)  Date: 09/13/22 Last Office Visit: 06/07/2022  Chief Complaint  Patient presents with   Bradycardia   Follow-up    HPI  Lisa Christian is a 32 y.o. Caucasian female who does not have any significant past cardiac history.    She was referred to the office back in March 2023 for evaluation of palpitations and chest pain.  At that time she did undergo Zio patch which noted an average heart rate of 71 bpm and no significant dysrhythmias.  In the exercise treadmill stress test was overall low risk study.  In May 2024 she delivered a baby boy and postpartum had hypotension and bradycardia likely secondary to acute blood loss anemia as her hemoglobin was 7 g/dL.  She had refused blood transfusions but was given Venofer.  She was referred to cardiology for further evaluation and management.  Given her symptoms and postpartum state patient was recommended to undergo an echocardiogram to rule out postpartum cardiomyopathy.  Echo in June 2024 notes preserved LVEF without any significant valvular heart disease with the exception of mild TR.  Clinically patient states that she does feel well compared to the last office visit.  However at times she continues to have palpitations.  She is unsure if it secondary to organic cardiac reasons versus anxiety/panic attacks.  No near-syncope or syncopal events.  FUNCTIONAL STATUS: No structured exercise program or daily routine.   ALLERGIES: Allergies  Allergen Reactions   Latex     Vaginal irritation    MEDICATION LIST PRIOR TO VISIT: Current Meds  Medication Sig   miconazole (MICATIN) 2 % cream Apply 1 Application topically 2 (two) times daily.   Prenatal MV & Min w/FA-DHA (PRENATAL ADULT GUMMY/DHA/FA PO) Take 2 tablets by mouth daily.  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   Headache    HNP (herniated nucleus pulposus), lumbar    Panic attacks    Pseudotumor cerebri    Regurgitation 11/24/2013   Shoulder dystocia during labor and delivery, delivered 02/14/2015   9lb0.4oz, mild, no deficits   Vitamin D deficiency     PAST SURGICAL HISTORY: Past Surgical History:  Procedure Laterality Date   BIOPSY N/A 01/11/2014   Procedure: BIOPSY;  Surgeon: West Bali, MD;  Location: [facility];  Service: Endoscopy;  Laterality: N/A;  duodenal and gastric   CESAREAN SECTION N/A 05/22/2022   Procedure: CESAREAN SECTION;  Surgeon: Kathrynn Running, MD;  Location: [facility] LD ORS;  Service: Obstetrics;  Laterality: N/A;   ESOPHAGOGASTRODUODENOSCOPY (EGD) WITH PROPOFOL N/A 01/11/2014   XBJ:YNWGNFAOZ/HYQM non-erosive   LUMBAR LAMINECTOMY/DECOMPRESSION MICRODISCECTOMY Left 03/01/2019   Procedure: Microdiscectomy - Lumbar Four-Lumbar Five - left;  Surgeon: Donalee Citrin, MD;  Location: [facility] OR;  Service: Neurosurgery;  Laterality: Left;  Microdiscectomy - Lumbar Four-Lumbar Five - left   WISDOM TOOTH EXTRACTION      FAMILY HISTORY: The patient family history includes Anxiety disorder in her mother; Breast cancer  in her maternal grandmother; Colon cancer in her maternal great-grandmother; Gallbladder disease in her mother; Heart disease in her maternal grandfather and paternal grandmother; Heart murmur in her maternal grandmother; Pancreatic cancer in her maternal uncle and maternal uncle; Stroke in her paternal grandfather.  SOCIAL HISTORY:  The patient  reports that she has never smoked. She has been exposed to tobacco smoke. She has never used smokeless tobacco.  She reports that she does not currently use alcohol. She reports that she does not use drugs.  REVIEW OF SYSTEMS: Review of Systems  Cardiovascular:  Negative for chest pain, claudication, dyspnea on exertion, leg swelling, near-syncope, orthopnea, palpitations, paroxysmal nocturnal dyspnea and syncope.  Respiratory:  Negative for shortness of breath.     PHYSICAL EXAM:    09/13/2022    3:19 PM 06/25/2022   11:48 AM 06/07/2022    2:57 PM  Vitals with BMI  Height 5\' 5"   5\' 5"   Weight 173 lbs 158 lbs 10 oz 159 lbs 3 oz  BMI 28.79 26.39 26.49  Systolic 105 103 540  Diastolic 67 62 73  Pulse 61 75 70   Physical Exam  Constitutional: No distress.  Age appropriate, hemodynamically stable.   Neck: No JVD present.  Cardiovascular: Normal rate, regular rhythm, S1 normal, S2 normal, intact distal pulses and normal pulses. Exam reveals no gallop, no S3 and no S4.  No murmur heard. Pulses:      Dorsalis pedis pulses are 2+ on the right side and 2+ on the left side.       Posterior tibial pulses are 2+ on the right side and 2+ on the left side.  Pulmonary/Chest: Effort normal and breath sounds normal. No stridor. She has no wheezes. She has no rales.  Abdominal: Soft. Bowel sounds are normal. She exhibits no distension. There is no abdominal tenderness.  Musculoskeletal:        General: No edema.     Cervical back: Neck supple.  Neurological: She is alert and oriented to person, place, and time. She has intact cranial nerves (2-12).  Skin: Skin is warm and moist.    CARDIAC DATABASE: EKG: 04/09/2021: Normal sinus rhythm, 62 bpm, without underlying ischemia or injury pattern.  May 28, 2022: Sinus bradycardia, 54 bpm, occasional PACs, without underlying ischemia or injury pattern  Cardiac monitor (Zio Patch): Patch Wear Time:  4 days and 21 hours  Dominant rhythm normal sinus rhythm. Heart rate 40-144 bpm.  Avg HR 71 bpm. No atrial fibrillation, supraventricular tachycardia, ventricular  tachycardia, high grade AV block, pauses (3 seconds or longer). Total ventricular ectopic burden <1%. Total supraventricular ectopic burden <1%. Patient triggered events: 22.  Underlying rhythm sinus without dysrhythmias.  These did not correlate with any significant dysrhythmias.  Echocardiogram: 07/10/2022: Normal LV systolic function with visual EF 55-60%. Left ventricle cavity is normal in size. Normal left ventricular wall thickness. Normal global wall motion. Normal diastolic filling pattern, normal LAP.  Left atrial cavity is mildly dilated at 35.28 ml/m^2. Mild tricuspid regurgitation. No evidence of pulmonary hypertension. Compared to 04/26/2021 mild TR is new, prior mild MR and mild TR are not appreciated on current study. .   Stress Testing: Exercise treadmill stress test 05/18/2021: Exercise treadmill stress test performed using Bruce protocol.  Patient reached 10.1 METS, and 90% of age predicted maximum heart rate.  Exercise capacity was excellent.  No chest pain reported.  Normal heart rate and hemodynamic response. Stress EKG revealed no ischemic changes. Low risk study.  Heart  Catheterization: None  LABORATORY DATA:    Latest Ref Rng & Units 06/25/2022    2:27 PM 05/24/2022    5:30 AM 05/23/2022    5:50 AM  CBC  WBC 4.0 - 10.5 K/uL  6.8  11.8   Hemoglobin 11.1 - 15.9 g/dL 16.1  7.7  7.9   Hematocrit 34.0 - 46.6 % 40.1  24.6  24.0   Platelets 150 - 400 K/uL  176  179        Latest Ref Rng & Units 02/01/2022    6:24 AM 06/07/2021   12:02 PM 05/02/2020    8:44 PM  CMP  Glucose 70 - 99 mg/dL 096   045   BUN 6 - 20 mg/dL <5   5   Creatinine 4.09 - 1.00 mg/dL 8.11   9.14   Sodium 782 - 145 mmol/L 133   136   Potassium 3.5 - 5.1 mmol/L 3.2   3.8   Chloride 98 - 111 mmol/L 104   106   CO2 22 - 32 mmol/L 19   26   Calcium 8.9 - 10.3 mg/dL 8.1   8.9   Total Protein 6.5 - 8.1 g/dL 5.7  7.3    Total Bilirubin 0.3 - 1.2 mg/dL 1.0  0.5    Alkaline Phos 38 - 126 U/L 48  43     AST 15 - 41 U/L 18  13    ALT 0 - 44 U/L 11  13      Lipid Panel     Component Value Date/Time   CHOL 150 06/07/2021 1202   TRIG 101.0 06/07/2021 1202   HDL 37.20 (L) 06/07/2021 1202   CHOLHDL 4 06/07/2021 1202   VLDL 20.2 06/07/2021 1202   LDLCALC 92 06/07/2021 1202    No components found for: "NTPROBNP" No results for input(s): "PROBNP" in the last 8760 hours. Recent Labs    05/23/22 0550 06/25/22 1427  TSH 3.558 1.550    BMP Recent Labs    02/01/22 0624  NA 133*  K 3.2*  CL 104  CO2 19*  GLUCOSE 105*  BUN <5*  CREATININE 0.55  CALCIUM 8.1*  GFRNONAA >60     HEMOGLOBIN A1C Lab Results  Component Value Date   HGBA1C 4.9 11/15/2021   External Labs: Collected: 04/11/2021 provided by primary team. TSH 2.33. Hemoglobin 12.8 g/dL, hematocrit 95.6%. BUN 10, creatinine 0.76. Sodium 140, potassium 4.1, chloride 104, bicarb 25. AST 11, ALT 9, alkaline phosphatase 38  IMPRESSION:    ICD-10-CM   1. Palpitations  R00.2     2. Bradycardia  R00.1         RECOMMENDATIONS: Syndee Zieman is a 32 y.o. Caucasian female without any significant past medical history.  Palpitations Chronic and stable. No identifiable cause. TSH and hemoglobin within normal limits. Has undergone appropriate cardiovascular workup including echocardiogram, GXT, and Zio patch. For now would recommend noncardiac workup to rule out undiagnosed anxiety/panic attacks versus other etiologies of her symptoms.  However, if the symptoms were to worsen in intensity frequency or duration repeat cardiac monitor could be considered. Patient prefers conservative management for now.  Bradycardia Recently noted in her postpartum state secondary to acute blood loss anemia. Prior Zio patch noted an average heart rate of 71 bpm. Her heart rate today is well-controlled. Not on AV nodal blocking agents. No symptoms of near syncope or syncopal events. Monitor for now  Her initial  echocardiogram noted mild mitral regurgitation at that time she  was recommended to have a follow-up study in 3 years.  However her repeat echo in June 2024 did not illustrate mitral regurgitation and therefore no additional testing is warranted at this time until unless a change in clinical status.  Will continue to follow-up on annual basis if needed.   FINAL MEDICATION LIST END OF ENCOUNTER: No orders of the defined types were placed in this encounter.   Medications Discontinued During This Encounter  Medication Reason   metroNIDAZOLE (METROGEL) 0.75 % vaginal gel    norethindrone (MICRONOR) 0.35 MG tablet    phenylephrine (,USE FOR PREPARATION-H,) 0.25 % suppository      Current Outpatient Medications:    miconazole (MICATIN) 2 % cream, Apply 1 Application topically 2 (two) times daily., Disp: 28.35 g, Rfl: 2   Prenatal MV & Min w/FA-DHA (PRENATAL ADULT GUMMY/DHA/FA PO), Take 2 tablets by mouth daily.  gummies, Disp: , Rfl:   No orders of the defined types were placed in this encounter.   There are no Patient Instructions on file for this visit.   --Continue cardiac medications as reconciled in final medication list. --Return in about 1 year (around 09/13/2023) for Follow up, Palpitations. Or sooner if needed. --Continue follow-up with your primary care physician regarding the management of your other chronic comorbid conditions.  Patient's questions and concerns were addressed to her satisfaction. She voices understanding of the instructions provided during this encounter.   This note was created using a voice recognition software as a result there may be grammatical errors inadvertently enclosed that do not reflect the nature of this encounter. Every attempt is made to correct such errors.  Tessa Lerner, [place], [facility]  Pager:  [phone] Office: [phone]

## 2023-06-05 ENCOUNTER — Encounter: Payer: Self-pay | Admitting: Women's Health

## 2023-08-31 IMAGING — US US ABDOMEN LIMITED
2 series · 14 of 25 positions shown · non-contrast
Comparison: None.

CLINICAL DATA: Pain right upper quadrant

EXAM:
ULTRASOUND ABDOMEN LIMITED RIGHT UPPER QUADRANT

[Series 1: us abdomen limited · 0.19mm/px · 13 of 53 slices shown (1 of 2)]
[im 1/53]
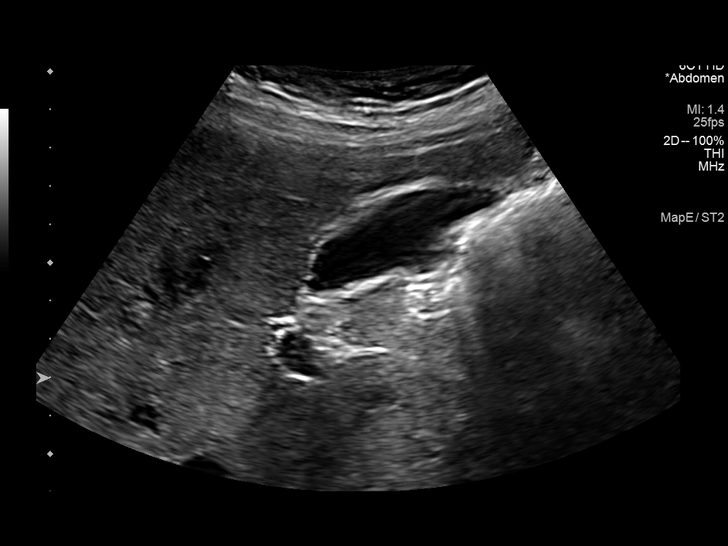
[im 5/53]
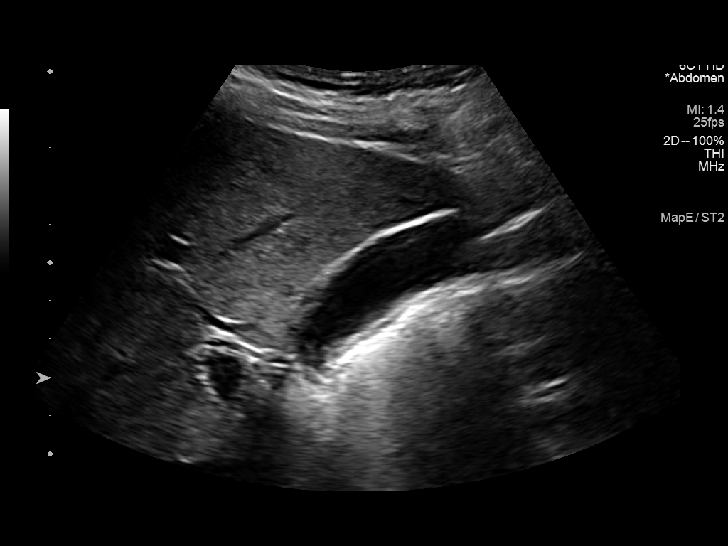
[im 10/53]
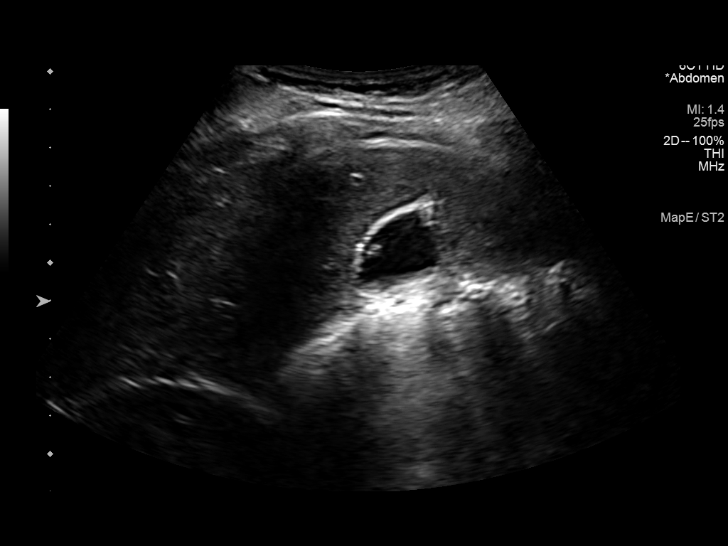
[im 14/53]
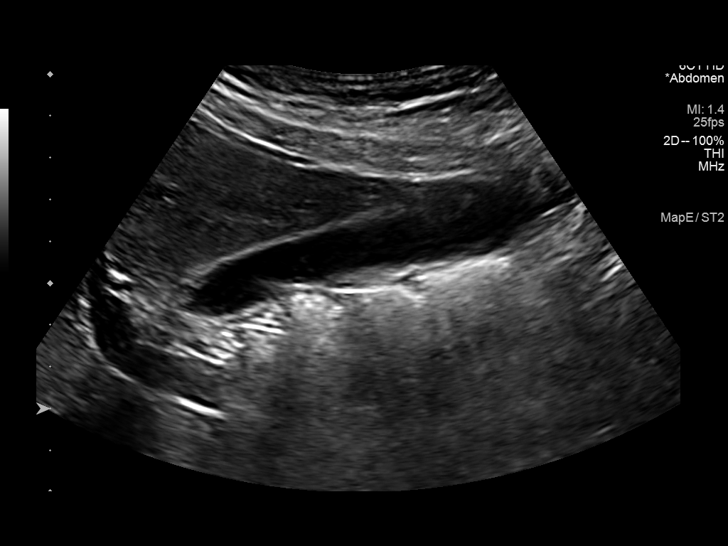
[im 19/53]
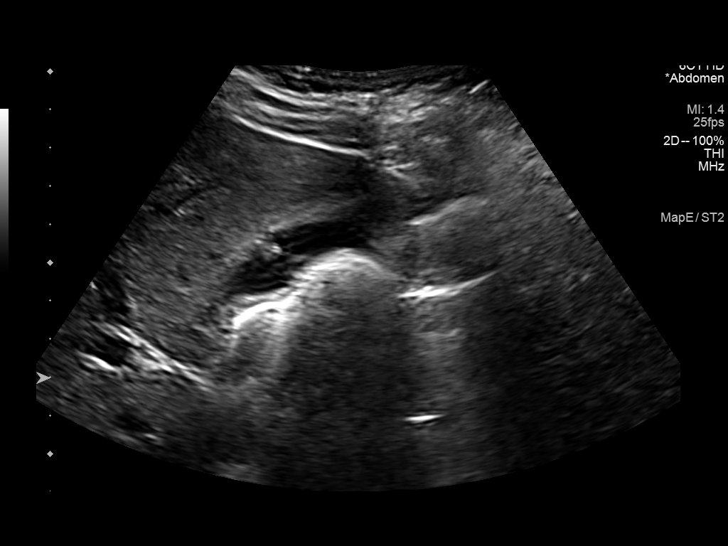
[im 21/53]
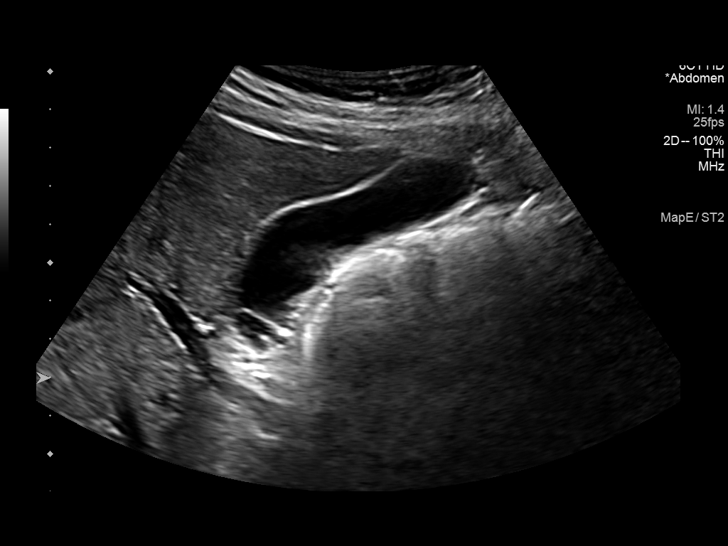
[im 25/53]
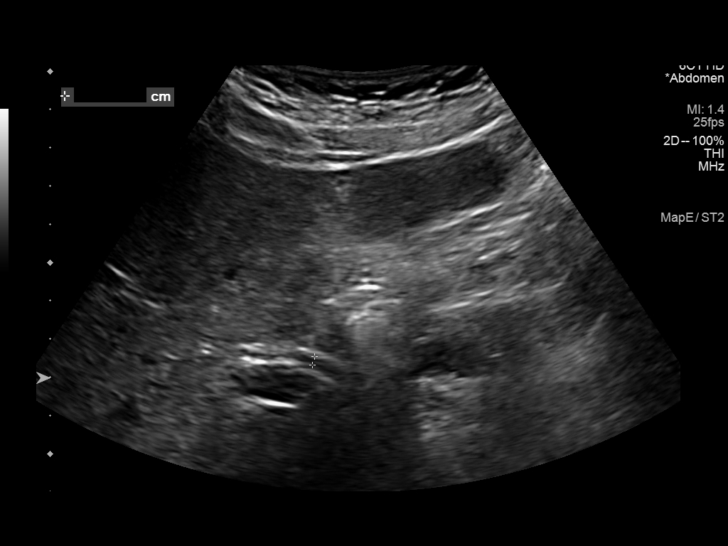
[im 30/53]
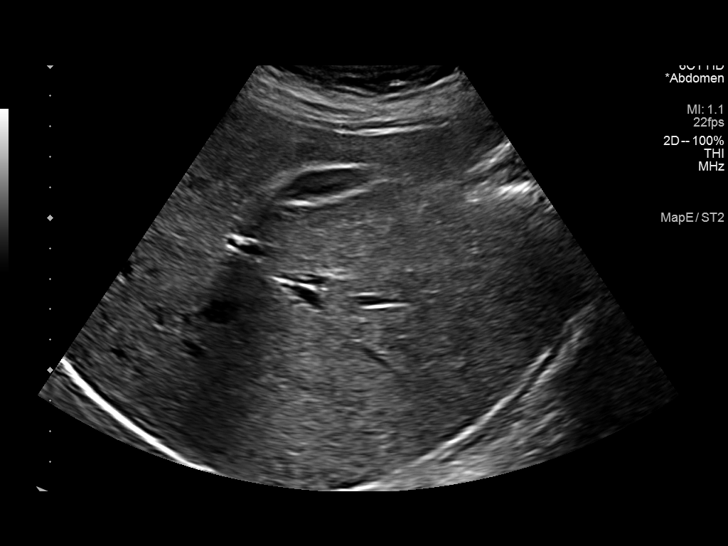
[im 34/53]
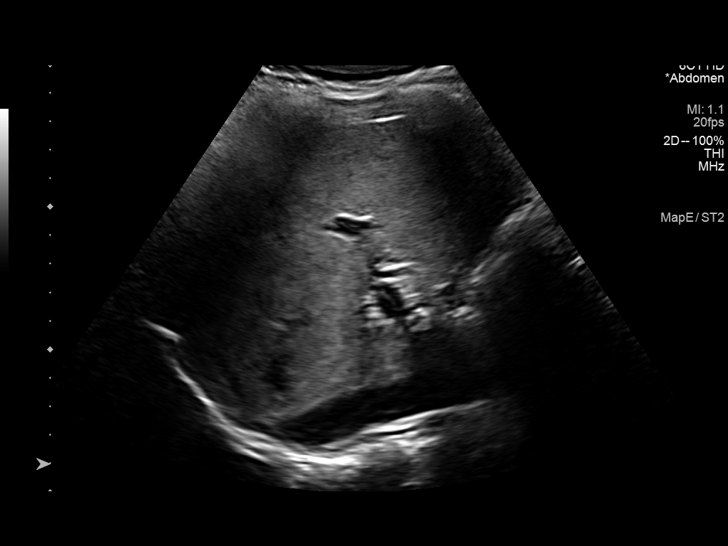
[im 37/53]
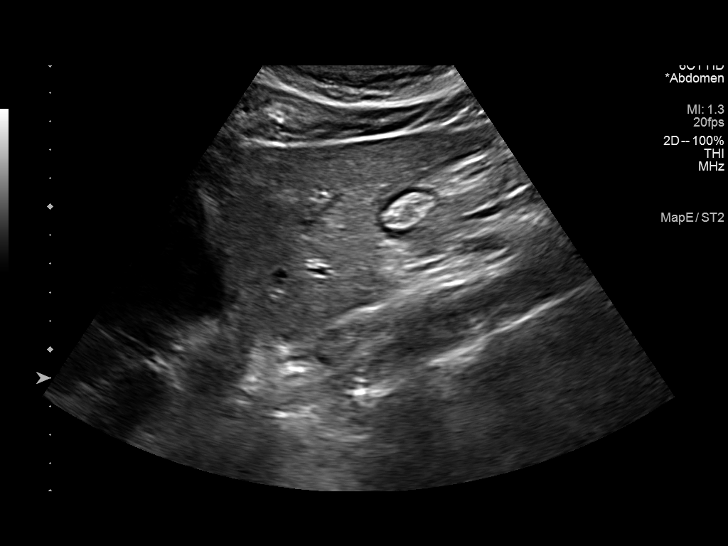
[im 41/53]
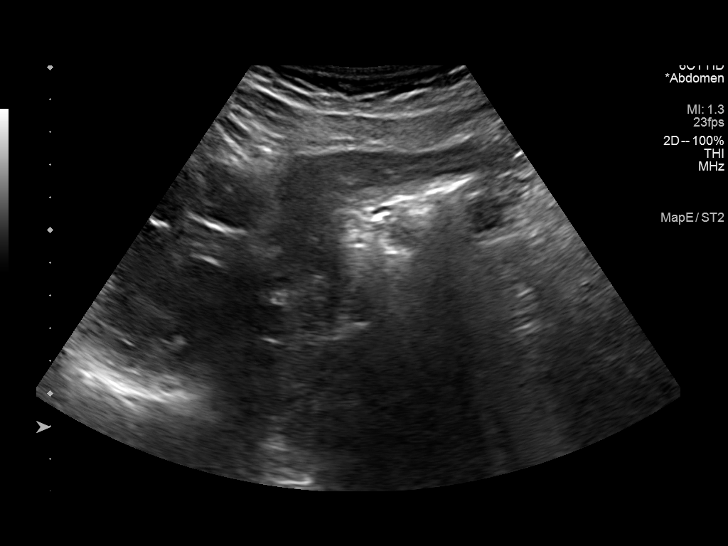
[im 46/53]
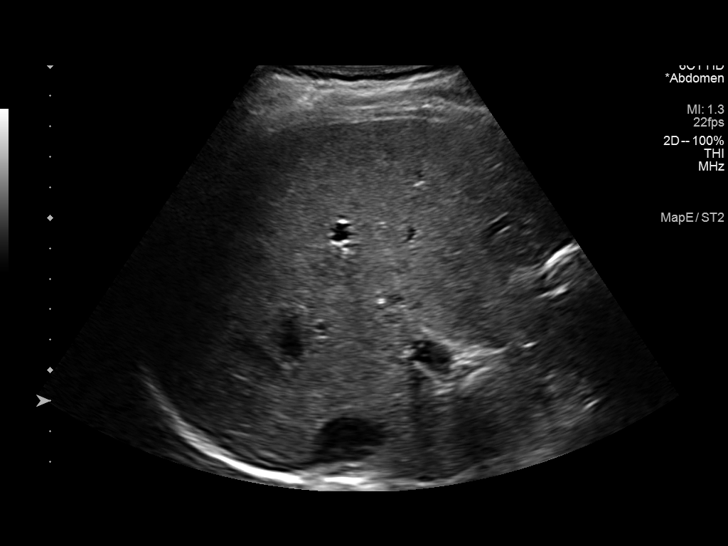
[im 50/53]
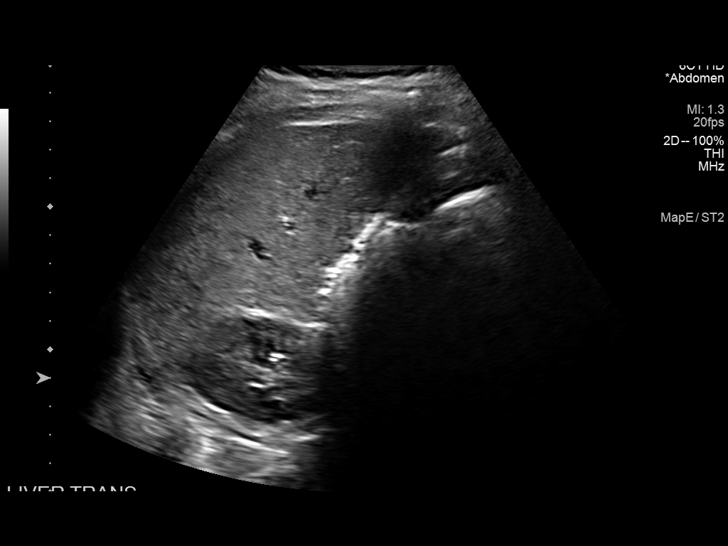

[Series 2001: us abdomen limited · 0.19mm/px · 1 of 1 slices shown (2 of 2)]
[im 1/1]
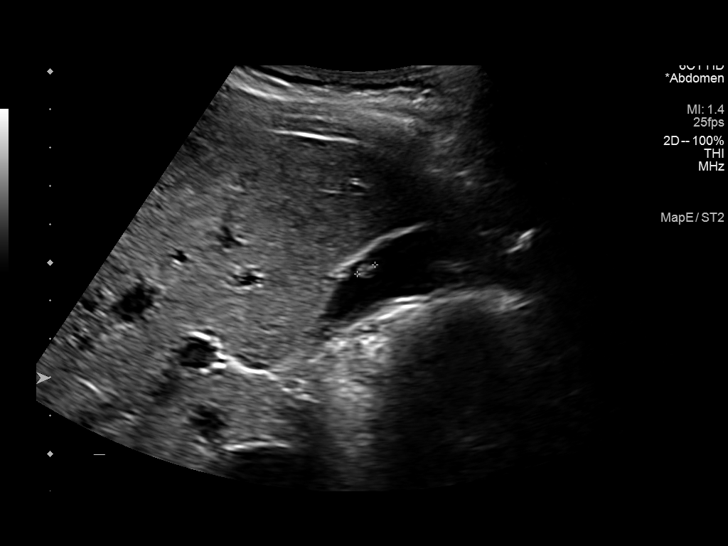

[14 of 25 positions shown; findings below may reference images not displayed]

FINDINGS: Gallbladder:

There are no demonstrable gallbladder stones. There is 5 mm
echogenic structure in the nondependent portion of gallbladder,
possibly a polyp. There are no demonstrable gallbladder stones.
Technologist did not observe any tenderness over the gallbladder.
There is no fluid around the gallbladder.

Common bile duct:

Diameter: 3.1 mm.

Liver:

No focal lesion identified. Within normal limits in parenchymal
echogenicity. Portal vein is patent on color Doppler imaging with
normal direction of blood flow towards the liver.

Other: None.
IMPRESSION: 5 mm gallbladder polyp is seen. There are no signs of acute
cholecystitis or significant dilation of bile ducts.

## 2023-09-03 ENCOUNTER — Encounter: Payer: Self-pay | Admitting: Women's Health

## 2023-09-03 ENCOUNTER — Ambulatory Visit: Admitting: Women's Health

## 2023-09-03 VITALS — BP 111/75 | HR 78 | Ht 66.0 in | Wt 199.0 lb

## 2023-09-03 DIAGNOSIS — K429 Umbilical hernia without obstruction or gangrene: Secondary | ICD-10-CM

## 2023-09-03 NOTE — Patient Instructions (Signed)
 Umbilical Hernia, Adult  A hernia is a lump of tissue that pushes through an opening in the muscles. An umbilical hernia happens in the belly, near the belly button. The hernia may contain tissues from the small or large intestine. It may also have fatty tissue that covers the intestines. Umbilical hernias in adults may get worse over time. They need to be treated with surgery. There are several types of umbilical hernias. They include: Indirect hernia. This occurs just above or below the belly button. It's the most common type of umbilical hernia in adults. Direct hernia. This type occurs in an opening that's formed by the belly button. Reducible hernia. This hernia comes and goes. You may see it only when you strain, cough, or lift something heavy. This type of hernia can be pushed back into the belly (reduced). Incarcerated hernia. This traps the hernia in the wall of the belly. This type of hernia can't be pushed back into the belly. It can cause a strangulated hernia. Strangulated hernia. This hernia cuts off blood flow to the tissues inside the hernia. The tissues can die if this happens. This type of hernia must be treated right away. What are the causes? An umbilical hernia happens when tissue inside the belly pushes through an opening in the muscles of the belly. What increases the risk? You're more likely to get this hernia if: You strain while lifting or pushing heavy objects. You've had several pregnancies. You have a condition that puts pressure on your belly, and you've had it for a long time. These include: Obesity. A buildup of fluid inside your belly. Vomiting or coughing all the time. Trouble pooping (constipation). You've had surgery that weakened the muscles in the belly. What are the signs or symptoms? The main symptom of this condition is a bulge at the belly button or near it. The bulge does not cause pain. Other symptoms depend on the type of hernia you have. A  reducible hernia may be seen only when you strain, cough, or lift something heavy. Other symptoms may include: Dull pain. A feeling of pressure. An incarcerated hernia may cause very bad pain. Also, you may: Vomit or feel like you may vomit. Not be able to pass gas. A strangulated hernia may cause: Pain that gets worse and worse. Vomiting, or feeling like you may vomit. Pain when you press on the hernia. Change of color on the skin over the hernia. The skin may become red or purple. Trouble pooping. Blood in the poop. How is this diagnosed? This condition may be diagnosed based on: Your symptoms and medical history. A physical exam. You may be asked to cough or strain while standing. These actions will put pressure inside your belly. The pressure can force the hernia through the opening in your muscles. Your health care provider may try to push the hernia back into your belly (reduce). How is this treated? Surgery is the only treatment for an umbilical hernia. Surgery for a strangulated hernia must be done right away. If you have a small hernia that's not incarcerated, you may need to lose weight before the surgery is done. Follow these instructions at home: Managing constipation You may need to take these actions to prevent trouble pooping. This will help to prevent straining. Drink enough fluid to keep your pee (urine) pale yellow. Take over-the-counter or prescription medicines. Eat foods that are high in fiber, such as beans, whole grains, and fresh fruits and vegetables. Limit foods that are high in  fat and sugars, such as fried or sweet foods. General instructions Do not try to push the hernia back in. Lose weight, if told by your provider. Watch your hernia for any changes in color or size. Tell your provider if any changes occur. You may need to avoid activities that put pressure on your hernia. You may have to avoid lifting. Ask your provider how much you can safely  lift. Take over-the-counter and prescription medicines only as told by your provider. Contact a health care provider if: Your hernia gets larger or feels hard. Your hernia becomes painful. You get a fever or chills. Get help right away if: You get very bad pain near the area of the hernia, and the pain comes on suddenly. You have pain and you vomit or feel like you may vomit. The skin over your hernia changes color. These symptoms may be an emergency. Get help right away. Call 911. Do not wait to see if the symptoms go away. Do not drive yourself to the hospital. This information is not intended to replace advice given to you by your health care provider. Make sure you discuss any questions you have with your health care provider. Document Revised: 04/30/2022 Document Reviewed: 04/30/2022 Elsevier Patient Education  2024 [employer].

## 2023-09-03 NOTE — Progress Notes (Signed)
   GYN VISIT Patient name: Lisa Christian MRN [id number]  Date of birth: 10-Dec-1990 Chief Complaint:   Abdominal Pain (Pain in belly area)  History of Present Illness:   Lisa Christian is a 33 y.o. G25P2002 Caucasian female being seen today for periumbilical pain last week, has eased off now. Was constant. No constipation. Is having hemorrhoids again, plans to see GI.     Patient's last menstrual period was 08/13/2023. Last pap 06/25/22. Results were: NILM w/ HRHPV negative     11/15/2021   11:04 AM 05/04/2021   10:48 AM 02/02/2018    2:46 PM  Depression screen PHQ 2/9  Decreased Interest 0 0 0  Down, Depressed, Hopeless 0 0 0  PHQ - 2 Score 0 0 0  Altered sleeping 0 1   Tired, decreased energy 0 0   Change in appetite 0 0   Feeling bad or failure about yourself  0 0   Trouble concentrating 0 0   Moving slowly or fidgety/restless 0 0   Suicidal thoughts 0 0   PHQ-9 Score 0 1         11/15/2021   11:04 AM 05/04/2021   10:49 AM  GAD 7 : Generalized Anxiety Score  Nervous, Anxious, on Edge 0 0  Control/stop worrying 0 0  Worry too much - different things 0 0  Trouble relaxing 1 0  Restless 0 0  Easily annoyed or irritable 0 0  Afraid - awful might happen 0 0  Total GAD 7 Score 1 0     Review of Systems:   Pertinent items are noted in HPI Denies fever/chills, dizziness, headaches, visual disturbances, fatigue, shortness of breath, chest pain, abdominal pain, vomiting, abnormal vaginal discharge/itching/odor/irritation, problems with periods, bowel movements, urination, or intercourse unless otherwise stated above.  Pertinent History Reviewed:  Reviewed past medical,surgical, social, obstetrical and family history.  Reviewed problem list, medications and allergies. Physical Assessment:   Vitals:   09/03/23 1628  BP: 111/75  Pulse: 78  Weight: 199 lb (90.3 kg)  Height: 5' 6 (1.676 m)  Body mass index is 32.12 kg/m.       Physical Examination:   General  appearance: alert, well appearing, and in no distress  Mental status: alert, oriented to person, place, and time  Skin: warm & dry   Cardiovascular: normal heart rate noted  Respiratory: normal respiratory effort, no distress  Abdomen: soft, possible small umbilical hernia, mild tenderness to palpation  Pelvic: examination not indicated  Extremities: no edema   Chaperone: N/A  No results found for this or any previous visit (from the past 24 hours).  Assessment & Plan:  1) ?mild small umbilical hernia> pain has improved. To try to avoid straining/pressure/heavy lifting, if returns and wants surgical referral let me know  Meds: No orders of the defined types were placed in this encounter.   No orders of the defined types were placed in this encounter.   Return in about 1 year (around 09/02/2024) for Physical.  Suzen JONELLE Fetters CNM, [place] [facility] 09/03/2023 4:58 PM

## 2023-09-15 ENCOUNTER — Ambulatory Visit: Payer: Self-pay | Admitting: Cardiology
